# Patient Record
Sex: Female | Born: 1980
Health system: Southern US, Community
[De-identification: ages and names within clinical notes are randomized; demographics above are authoritative.]

## PROBLEM LIST (undated history)

## (undated) DIAGNOSIS — T7840XA Allergy, unspecified, initial encounter: Secondary | ICD-10-CM

## (undated) DIAGNOSIS — Z860100 Personal history of colon polyps, unspecified: Secondary | ICD-10-CM

## (undated) DIAGNOSIS — A6920 Lyme disease, unspecified: Secondary | ICD-10-CM

## (undated) DIAGNOSIS — G479 Sleep disorder, unspecified: Secondary | ICD-10-CM

## (undated) DIAGNOSIS — K5190 Ulcerative colitis, unspecified, without complications: Secondary | ICD-10-CM

## (undated) DIAGNOSIS — D649 Anemia, unspecified: Secondary | ICD-10-CM

## (undated) DIAGNOSIS — F419 Anxiety disorder, unspecified: Secondary | ICD-10-CM

## (undated) DIAGNOSIS — R630 Anorexia: Secondary | ICD-10-CM

## (undated) DIAGNOSIS — M8700 Idiopathic aseptic necrosis of unspecified bone: Secondary | ICD-10-CM

## (undated) DIAGNOSIS — K219 Gastro-esophageal reflux disease without esophagitis: Secondary | ICD-10-CM

## (undated) DIAGNOSIS — F32A Depression, unspecified: Secondary | ICD-10-CM

## (undated) HISTORY — DX: Personal history of colon polyps, unspecified: Z86.0100

## (undated) HISTORY — DX: Allergy, unspecified, initial encounter: T78.40XA

## (undated) HISTORY — DX: Depression, unspecified: F32.A

## (undated) HISTORY — DX: Anxiety disorder, unspecified: F41.9

## (undated) HISTORY — PX: OTHER SURGICAL HISTORY: SHX169

## (undated) HISTORY — DX: Idiopathic aseptic necrosis of unspecified bone: M87.00

## (undated) HISTORY — PX: LASIK: SHX215

## (undated) HISTORY — DX: Gastro-esophageal reflux disease without esophagitis: K21.9

## (undated) HISTORY — DX: Anemia, unspecified: D64.9

## (undated) HISTORY — PX: COLONOSCOPY: SHX174

## (undated) HISTORY — DX: Personal history of colonic polyps: Z86.010

---
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
## 2000-02-16 ENCOUNTER — Other Ambulatory Visit: Admission: RE | Admit: 2000-02-16 | Discharge: 2000-02-16 | Payer: Self-pay | Admitting: *Deleted

## 2001-03-29 ENCOUNTER — Other Ambulatory Visit: Admission: RE | Admit: 2001-03-29 | Discharge: 2001-03-29 | Payer: Self-pay | Admitting: Obstetrics and Gynecology

## 2002-04-26 ENCOUNTER — Other Ambulatory Visit: Admission: RE | Admit: 2002-04-26 | Discharge: 2002-04-26 | Payer: Self-pay | Admitting: Obstetrics and Gynecology

## 2003-04-16 ENCOUNTER — Other Ambulatory Visit: Admission: RE | Admit: 2003-04-16 | Discharge: 2003-04-16 | Payer: Self-pay | Admitting: Obstetrics and Gynecology

## 2006-01-11 DIAGNOSIS — A6920 Lyme disease, unspecified: Secondary | ICD-10-CM

## 2006-01-11 HISTORY — DX: Lyme disease, unspecified: A69.20

## 2006-07-12 DIAGNOSIS — A6920 Lyme disease, unspecified: Secondary | ICD-10-CM | POA: Insufficient documentation

## 2007-08-10 ENCOUNTER — Emergency Department (HOSPITAL_COMMUNITY): Admission: EM | Admit: 2007-08-10 | Discharge: 2007-08-11 | Payer: Self-pay | Admitting: Emergency Medicine

## 2008-03-17 ENCOUNTER — Emergency Department (HOSPITAL_COMMUNITY): Admission: EM | Admit: 2008-03-17 | Discharge: 2008-03-17 | Payer: Self-pay | Admitting: Emergency Medicine

## 2008-07-16 ENCOUNTER — Encounter: Payer: Self-pay | Admitting: Internal Medicine

## 2008-08-21 ENCOUNTER — Encounter: Payer: Self-pay | Admitting: Internal Medicine

## 2008-09-13 ENCOUNTER — Ambulatory Visit (HOSPITAL_COMMUNITY): Admission: RE | Admit: 2008-09-13 | Discharge: 2008-09-13 | Payer: Self-pay | Admitting: Gastroenterology

## 2008-09-13 ENCOUNTER — Encounter: Payer: Self-pay | Admitting: Internal Medicine

## 2008-09-13 ENCOUNTER — Encounter (INDEPENDENT_AMBULATORY_CARE_PROVIDER_SITE_OTHER): Payer: Self-pay | Admitting: Gastroenterology

## 2009-01-06 ENCOUNTER — Emergency Department (HOSPITAL_COMMUNITY): Admission: EM | Admit: 2009-01-06 | Discharge: 2009-01-06 | Payer: Self-pay | Admitting: Emergency Medicine

## 2009-01-14 ENCOUNTER — Encounter: Payer: Self-pay | Admitting: Internal Medicine

## 2009-01-28 ENCOUNTER — Encounter: Payer: Self-pay | Admitting: Internal Medicine

## 2009-01-30 ENCOUNTER — Encounter: Payer: Self-pay | Admitting: Internal Medicine

## 2009-01-30 ENCOUNTER — Ambulatory Visit (HOSPITAL_COMMUNITY): Admission: RE | Admit: 2009-01-30 | Discharge: 2009-01-30 | Payer: Self-pay | Admitting: Gastroenterology

## 2009-01-31 ENCOUNTER — Encounter: Payer: Self-pay | Admitting: Internal Medicine

## 2009-02-12 ENCOUNTER — Encounter: Payer: Self-pay | Admitting: Internal Medicine

## 2009-02-12 LAB — CONVERTED CEMR LAB: TSH: 1.24 microintl units/mL

## 2009-02-14 ENCOUNTER — Encounter: Payer: Self-pay | Admitting: Internal Medicine

## 2009-03-05 ENCOUNTER — Encounter: Payer: Self-pay | Admitting: Internal Medicine

## 2009-03-24 ENCOUNTER — Ambulatory Visit: Payer: Self-pay | Admitting: Occupational Medicine

## 2009-03-24 ENCOUNTER — Ambulatory Visit: Payer: Self-pay | Admitting: Diagnostic Radiology

## 2009-03-24 ENCOUNTER — Encounter: Payer: Self-pay | Admitting: Emergency Medicine

## 2009-03-25 ENCOUNTER — Encounter: Payer: Self-pay | Admitting: Internal Medicine

## 2009-03-25 ENCOUNTER — Inpatient Hospital Stay (HOSPITAL_COMMUNITY): Admission: EM | Admit: 2009-03-25 | Discharge: 2009-03-27 | Payer: Self-pay | Admitting: Internal Medicine

## 2009-03-27 ENCOUNTER — Encounter: Payer: Self-pay | Admitting: Internal Medicine

## 2009-04-25 ENCOUNTER — Encounter (INDEPENDENT_AMBULATORY_CARE_PROVIDER_SITE_OTHER): Payer: Self-pay | Admitting: *Deleted

## 2009-06-02 ENCOUNTER — Ambulatory Visit: Payer: Self-pay | Admitting: Internal Medicine

## 2009-06-02 DIAGNOSIS — D500 Iron deficiency anemia secondary to blood loss (chronic): Secondary | ICD-10-CM | POA: Insufficient documentation

## 2009-06-02 DIAGNOSIS — K5190 Ulcerative colitis, unspecified, without complications: Secondary | ICD-10-CM | POA: Insufficient documentation

## 2009-06-03 DIAGNOSIS — Z860100 Personal history of colon polyps, unspecified: Secondary | ICD-10-CM | POA: Insufficient documentation

## 2009-06-06 ENCOUNTER — Encounter: Payer: Self-pay | Admitting: Internal Medicine

## 2009-06-06 LAB — CONVERTED CEMR LAB
Basophils Absolute: 0 10*3/uL (ref 0.0–0.1)
Basophils Relative: 0.5 % (ref 0.0–3.0)
CRP, High Sensitivity: 22.18 — ABNORMAL HIGH (ref 0.00–5.00)
Eosinophils Absolute: 0.1 10*3/uL (ref 0.0–0.7)
Eosinophils Relative: 1.5 % (ref 0.0–5.0)
HCT: 34.8 % — ABNORMAL LOW (ref 36.0–46.0)
Hemoglobin: 11.3 g/dL — ABNORMAL LOW (ref 12.0–15.0)
Monocytes Absolute: 0.6 10*3/uL (ref 0.1–1.0)
Neutro Abs: 5.4 10*3/uL (ref 1.4–7.7)
RBC: 4.32 M/uL (ref 3.87–5.11)
RDW: 16.6 % — ABNORMAL HIGH (ref 11.5–14.6)

## 2009-07-07 ENCOUNTER — Ambulatory Visit: Payer: Self-pay | Admitting: Internal Medicine

## 2009-07-11 LAB — CONVERTED CEMR LAB
Basophils Relative: 0.4 % (ref 0.0–3.0)
Eosinophils Absolute: 0.1 10*3/uL (ref 0.0–0.7)
Eosinophils Relative: 0.7 % (ref 0.0–5.0)
Ferritin: 6.6 ng/mL — ABNORMAL LOW (ref 10.0–291.0)
Hemoglobin: 11.6 g/dL — ABNORMAL LOW (ref 12.0–15.0)
Lymphocytes Relative: 21.9 % (ref 12.0–46.0)
Monocytes Absolute: 0.7 10*3/uL (ref 0.1–1.0)
Monocytes Relative: 5.6 % (ref 3.0–12.0)
Neutrophils Relative %: 71.4 % (ref 43.0–77.0)
Platelets: 332 10*3/uL (ref 150.0–400.0)
RDW: 15.3 % — ABNORMAL HIGH (ref 11.5–14.6)
Vitamin B-12: 437 pg/mL (ref 211–911)

## 2009-07-31 ENCOUNTER — Telehealth: Payer: Self-pay | Admitting: Internal Medicine

## 2009-09-02 ENCOUNTER — Ambulatory Visit: Payer: Self-pay | Admitting: Internal Medicine

## 2009-09-02 LAB — CONVERTED CEMR LAB
Basophils Absolute: 0 10*3/uL (ref 0.0–0.1)
CRP, High Sensitivity: 32.41 — ABNORMAL HIGH (ref 0.00–5.00)
Hemoglobin: 10.2 g/dL — ABNORMAL LOW (ref 12.0–15.0)
MCHC: 33.5 g/dL (ref 30.0–36.0)
Monocytes Relative: 8.5 % (ref 3.0–12.0)
RBC: 3.65 M/uL — ABNORMAL LOW (ref 3.87–5.11)
RDW: 17.2 % — ABNORMAL HIGH (ref 11.5–14.6)
Sed Rate: 69 mm/hr — ABNORMAL HIGH (ref 0–22)
WBC: 10.2 10*3/uL (ref 4.5–10.5)

## 2009-09-05 ENCOUNTER — Ambulatory Visit: Payer: Self-pay | Admitting: Internal Medicine

## 2009-09-05 DIAGNOSIS — Z9119 Patient's noncompliance with other medical treatment and regimen: Secondary | ICD-10-CM | POA: Insufficient documentation

## 2009-09-10 ENCOUNTER — Telehealth: Payer: Self-pay | Admitting: Internal Medicine

## 2009-09-30 ENCOUNTER — Ambulatory Visit (HOSPITAL_COMMUNITY): Admission: RE | Admit: 2009-09-30 | Discharge: 2009-09-30 | Payer: Self-pay | Admitting: Internal Medicine

## 2009-09-30 ENCOUNTER — Encounter: Payer: Self-pay | Admitting: Internal Medicine

## 2009-10-03 ENCOUNTER — Telehealth: Payer: Self-pay | Admitting: Internal Medicine

## 2009-10-30 ENCOUNTER — Ambulatory Visit: Payer: Self-pay | Admitting: Internal Medicine

## 2009-10-30 LAB — CONVERTED CEMR LAB
Basophils Absolute: 0 10*3/uL (ref 0.0–0.1)
Basophils Relative: 0.4 % (ref 0.0–3.0)
Eosinophils Absolute: 0.1 10*3/uL (ref 0.0–0.7)
Eosinophils Relative: 1 % (ref 0.0–5.0)
HCT: 34.7 % — ABNORMAL LOW (ref 36.0–46.0)
Hemoglobin: 11.6 g/dL — ABNORMAL LOW (ref 12.0–15.0)
Lymphocytes Relative: 32.7 % (ref 12.0–46.0)
Lymphs Abs: 3.2 10*3/uL (ref 0.7–4.0)
Monocytes Relative: 8.9 % (ref 3.0–12.0)
Neutro Abs: 5.5 10*3/uL (ref 1.4–7.7)
Platelets: 224 10*3/uL (ref 150.0–400.0)
RBC: 3.95 M/uL (ref 3.87–5.11)
RDW: 19 % — ABNORMAL HIGH (ref 11.5–14.6)

## 2009-11-01 ENCOUNTER — Telehealth: Payer: Self-pay | Admitting: Internal Medicine

## 2009-12-03 ENCOUNTER — Encounter (INDEPENDENT_AMBULATORY_CARE_PROVIDER_SITE_OTHER): Payer: Self-pay | Admitting: *Deleted

## 2009-12-11 ENCOUNTER — Ambulatory Visit: Payer: Self-pay | Admitting: Internal Medicine

## 2009-12-11 LAB — CONVERTED CEMR LAB
Eosinophils Relative: 2.2 % (ref 0.0–5.0)
Ferritin: 147 ng/mL (ref 10.0–291.0)
HCT: 36.6 % (ref 36.0–46.0)
Lymphs Abs: 3.1 10*3/uL (ref 0.7–4.0)
MCV: 89.7 fL (ref 78.0–100.0)
Monocytes Absolute: 0.6 10*3/uL (ref 0.1–1.0)
Neutrophils Relative %: 41.7 % — ABNORMAL LOW (ref 43.0–77.0)
Platelets: 189 10*3/uL (ref 150.0–400.0)
RDW: 17.6 % — ABNORMAL HIGH (ref 11.5–14.6)
WBC: 6.7 10*3/uL (ref 4.5–10.5)

## 2009-12-17 ENCOUNTER — Encounter (INDEPENDENT_AMBULATORY_CARE_PROVIDER_SITE_OTHER): Payer: Self-pay | Admitting: *Deleted

## 2009-12-17 ENCOUNTER — Ambulatory Visit: Payer: Self-pay | Admitting: Internal Medicine

## 2010-01-06 ENCOUNTER — Telehealth: Payer: Self-pay | Admitting: Internal Medicine

## 2010-01-09 ENCOUNTER — Telehealth (INDEPENDENT_AMBULATORY_CARE_PROVIDER_SITE_OTHER): Payer: Self-pay | Admitting: *Deleted

## 2010-01-27 ENCOUNTER — Telehealth (INDEPENDENT_AMBULATORY_CARE_PROVIDER_SITE_OTHER): Payer: Self-pay | Admitting: *Deleted

## 2010-01-29 ENCOUNTER — Other Ambulatory Visit: Payer: Self-pay | Admitting: Obstetrics and Gynecology

## 2010-02-10 NOTE — Progress Notes (Signed)
Summary: Triage  Phone Note Call from Patient Call back at Home Phone [phone]   Caller: Patient Call For: Dr. Carlean Purl Reason for Call: Talk to Nurse Summary of Call: Calling about her Iron infusion that is suppose to be sch'd Initial call taken by: Webb Laws,  September 10, 2009 9:03 AM  Follow-up for Phone Call        Last seen 09-05-09. When does she need an Iron Infusion?  [place] [facility]  Follow-up by: Vivia Ewing LPN,  September 10, 7193 9:16 AM  Additional Follow-up for Phone Call Additional follow up Details #1::        any time now I think we were waiting for her to tell us when sho could? Gatha Mayer MD, [facility]  September 10, 2009 12:59 PM  LM to [facility] at home number. Abelino Derrick CMA Deborra Medina)  September 10, 2009 3:40 PM   RC from pt.  I apologized to patient for confusion in who was supposed to call who.  Advised her I have spoken to Short Stay at [facility] and the best they can do is next Friday, but they were unable to schedule at the time.  I told her I will call later today to schedule and will call her with date and time.  She should most definitely hear from [place] by tomorrow afternoon.  Per pt it is OK to leave instructions on cell (home) phone voicemail. Additional Follow-up by: Abelino Derrick CMA Deborra Medina),  September 11, 2009 9:14 AM    Additional Follow-up for Phone Call Additional follow up Details #2::    Pt is scheduled for 09/24/09 pt aware, but would rather do on 09/25/09 short stay unable to do that day [place] Deborra Medina)  September 11, 2009 4:07 PM    Appended Document: Triage iron infusion rescheduled with Tammy at [facility]  short stay to Tuesday, Sept 20.  LM to [facility] at home number to discuss new date/time.  Appended Document: Triage RC from pt stating date/time are perfect and she will be there.  Appended Document: Orders Update    Clinical Lists Changes  Orders: Added new Test order of Iron Infusion (Iron Inf) -  Signed

## 2010-02-10 NOTE — Assessment & Plan Note (Addendum)
Summary: anemia f/u   History of Present Illness Visit Type: Follow-up Visit Primary GI MD: [name] MD [facility] Primary Provider: [name], MD  Requesting Provider: na Chief Complaint: anemia  History of Present Illness:   30 yo ww with ulcerative colitis. Here for follow-up after iron infusion. Stil fatigued, maybe somewhat better since iron infusion. Denies abdominal pain. Someties she is gassy with certain foods. No diarrhea or rectal bleeding. She is now compliant with Lialda "My boyfriend helps me with a reminder on his phone".   GI Review of Systems      Denies abdominal pain, acid reflux, belching, bloating, chest pain, dysphagia with liquids, dysphagia with solids, heartburn, loss of appetite, nausea, vomiting, vomiting blood, weight loss, and  weight gain.        Denies anal fissure, black tarry stools, change in bowel habit, constipation, diarrhea, diverticulosis, fecal incontinence, heme positive stool, hemorrhoids, irritable bowel syndrome, jaundice, light color stool, liver problems, rectal bleeding, and  rectal pain.    Current Medications (verified): 1)  Depo-Provera 400 Mg/ml Susp (Medroxyprogesterone Acetate) .... One Injection Once A Month 2)  Lialda 1.2 Gm Tbec (Mesalamine) .... Two Tablets By Mouth Two Times A Day 3)  Hyoscyamine Sulfate 0.125 Mg Subl (Hyoscyamine Sulfate) .Marland Kitchen.. 1-2 Every Hours As Needed For Abdominal Cramps  Allergies (verified): No Known Drug Allergies  Past History:  Past Medical History: Ulcerative colitis - universal, symptoms began 2010 Lyme disease adenoma of rectum Anemia  Past Surgical History: Reviewed history from 03/24/2009 and no changes required. "tubes" in ears  Family History: Reviewed history from 06/02/2009 and no changes required. Breast CA- Mother Family History of Colon Cancer: PGM Family History of Colon Polyps: Paternal Aunt  Family History of Diabetes: MGM, PGF  Family History of Heart Disease: MGM and  MGF  Family History of Irritable Bowel Syndrome: Mom  Social History: Reviewed history from 06/02/2009 and no changes required. 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 Eyes:   no icterus. Lungs:  Clear throughout to auscultation. Heart:  Regular rate and rhythm; no murmurs, rubs,  or bruits. Abdomen:  soft and nontender without HSM/mass BS+ Rectal:  deferred until time of colonoscopy.   Skin:  tattoo lower back   Impression & Recommendations:  Problem # 1:  COLITIS, ULCERATIVE, UNIVERSAL (ICD-556.6)  Signs and symptoms began Spring 2010 with tensemus, loose stools and bleeding. Flex sig 08/21/08 with proctitis and rectal polyp. Colonoscopy 09/13/2008 with 8 mm rectal adenoma, proctitis and peri-appendiceal inflammation. Colonoscopy 01/30/09 with universal colitis, biospies show moderate to severe colitis, no dysplasia and normal terminal ileum. TTG antibody negative, C. diff negative in Jan 2011.Treated with Lialda and steroids intermittently, hospitalized March 2011.  Much better now and says she is compliant with Lialda now - so continue with that  Orders: Colonoscopy (Colon)  Problem # 2:  COLONIC POLYPS, ADENOMATOUS, HX OF (  ICD-V12.72) rectal adenoma in setting of ulcerative colitis in 2010. Needs closer follow-up due to this.  Orders: Colonoscopy (Colon)  Problem # 3:  SCREENING, COLON CANCER (ICD-V76.51)  Orders: Colonoscopy (Colon)  Problem # 4:  ANEMIA, SECONDARY TO BLOOD LOSS  (ICD-280.0) Assessment: Improved Ferritin >100 and Hgb normal after IV iron follow-up labs  later in year - arrange after colonoscpy  Patient Instructions: 1)  Please pick up your medications at your pharmacy.  2)  We will see you at your procedure on 02/02/10. 3)  [place] Patient Information Guide given to patient.  4)  Colonoscopy and Flexible Sigmoidoscopy brochure given.  5)  Copy sent to : [name], MD 6)  The medication list was reviewed and reconciled.  All changed / newly prescribed medications were explained.  A complete medication list was provided to the patient / caregiver. Prescriptions: REGLAN 10 MG  TABS (METOCLOPRAMIDE HCL) As per prep instructions.  #2 x 0   Entered by:   Abelino Derrick CMA ([place])   Authorized by:   Gatha Mayer MD, [facility]   Signed by:   Abelino Derrick CMA ([place]) on 12/17/2009   Method used:   Electronically to        [facility]. [phone]* (retail)       [address] [place], [place]  [address]       Ph: [phone]       Fax: [phone]   RxID:   [id number] DULCOLAX 5 MG  TBEC (BISACODYL) Day before procedure take 2 at 3pm and 2 at 8pm.  #4 x 0   Entered by:   Abelino Derrick CMA ([place])   Authorized by:   Gatha Mayer MD, [facility]   Signed by:   Abelino Derrick CMA ([place]) on 12/17/2009   Method used:   Electronically to        [facility]. [phone]* (retail)       [place], [place]  [address]       Ph: [phone]       Fax: [phone]   RxID:   [id number] [place]   POWD (POLYETHYLENE GLYCOL 3350) As per prep  instructions.  #255gm x 0   Entered by:   Abelino Derrick CMA ([place])   Authorized by:   Gatha Mayer MD, [facility]   Signed by:   Abelino Derrick CMA ([place]) on 12/17/2009   Method used:   Electronically to        [facility]. [phone]* (retail)       [address] [place], [place]  [address]       Ph:  [phone]       Fax: [phone]   RxID:   [id number]   Appended Document: anemia f/u she has had colonoscopy needs CBC and ferritin to follow-up anemia in [facility] April 2012  Appended Document: anemia f/u labs entered in [facility]

## 2010-02-10 NOTE — Assessment & Plan Note (Signed)
Summary: colitis--ch.   History of Present Illness Visit Type: new patient  Primary GI MD: [name] MD [facility] Primary Provider: [name], MD  Requesting Provider: na Chief Complaint: ulcerative colitis History of Present Illness:   30 yo single white woman with uniiversal ulcerative colitis, diagnosed initially as ulcerative proctitis in 2010. She is here to establish care with me. She has no active complaints today but does have some mild abdominal pain at times. Stools are now formed after a winter marked by a flare of ulcerative colitis and hospitalization in March 2011. Symptoms and signs were bloody diarrhea, abdominal pain and nausea and vomiting. She was treated with prednisone and Lialda and is now on lialda alone, taking 2.4 grams bid. Her mom accompanies her and provides some history.   GI Review of Systems    Reports abdominal pain.     Location of  Abdominal pain: lower abdomen.    Denies acid reflux, belching, bloating, chest pain, dysphagia with liquids, dysphagia with solids, heartburn, loss of appetite, nausea, vomiting, vomiting blood, and  weight loss.      Reports rectal bleeding and  rectal pain.     Denies anal fissure, black tarry stools, change in bowel habit, constipation, diarrhea, diverticulosis, fecal incontinence, heme positive stool, hemorrhoids, irritable bowel syndrome, jaundice, light color stool, and  liver problems. Flexible Sigmoidoscopy  Procedure date:  08/21/2008  Findings:      Proctitis to 10 cm rectal polyp (95m)  JSamara Deist MD  Colonoscopy  Procedure date:  09/13/2008  Findings:      Proctitis Rectal adenoma (849mremoved) Inflammation periappendiceal  J. HaAmedeo PlentyMD  Colonoscopy  Procedure date:  01/30/2009  Findings:      Colitis - universal, consistent with ulcerative colitis normal terminal ileum  Cecal biopsies moderatel active chronic colitis  Rectal biopsies moderately to severely active chronic colitis  No  dysplasia or malignancy  J. HaAmedeo PlentyMD  CT Abdomen/Pelvis  Procedure date:  03/24/2009  Findings:         1.  Circumferential bowel wall thickening and edema most severe in   the rectum and sigmoid colon consistent with acute inflammatory   bowel disease.  Bowel wall edema extends into the descending colon.   2.  Fluid in the ascending colon suggest diarrheal disease.   3.  No evidence of abscess or other source of infection.     Current Medications (verified): 1)  Depo Shot .... As Directed 2)  Multivitamins  Caps (Multiple Vitamin) .... Once Daily 3)  Lialda 1.2 Gm Tbec (Mesalamine) .... Take Four Tablets By Mouth Once Daily 4)  Promethazine Hcl 25 Mg Tabs (Promethazine Hcl) .... One Tablet Every 4-6 Hours As Needed For Nausea  Allergies (verified): No Known Drug Allergies  Past History:  Past Medical History: Ulcerative colitis - universal, symptoms began 2010 Lymes disease Colon Polyps   Past Surgical History: Reviewed history from 03/24/2009 and no changes required. "tubes" in ears  Family History: Breast CA- Mother Family History of Colon Cancer: PGM Family History of Colon Polyps: Paternal Aunt  Family History of Diabetes: MGM, PGF  Family History of Heart Disease: MGM and MGF  Family History of Irritable Bowel Syndrome: Mom  Social History: Occupation: RNTherapist, sportsWLDeepwaterth floor  Single Never Smoked Alcohol use-yes- 5/week Drug use-no Regular exercise-no Daily Caffeine Use: 2-3   Review of Systems       The patient complains of allergy/sinus, fatigue, fever, headaches-new, muscle pains/cramps, sleeping problems, sore throat, swelling of  feet/legs, and swollen lymph glands.         Most of these occurred with UC flare in March 2011 All other ROS negative except as per HPI.   Vital Signs:  Patient profile:   30 year old female Height:      66 inches Weight:      109 pounds BMI:     17.66 BSA:     1.55 Pulse rate:   104 / minute Pulse rhythm:    regular BP sitting:   116 / 64  (left arm) Cuff size:   regular  Vitals Entered By: Hope Pigeon CMA (Jun 02, 2009 3:10 PM)  Physical Exam  General:  Well developed, well nourished, no acute distress. Eyes:  PERRLA, no icterus. Mouth:  No deformity or lesions, dentition normal. Neck:  Supple; no masses or thyromegaly. Lungs:  Clear throughout to auscultation. Heart:  Regular rate and rhythm; no murmurs, rubs,  or bruits. Abdomen:  mildly sensitive in LLQ otherwise soft and nontender without HSM/mass BS+ Extremities:  No clubbing, cyanosis, edema or deformities noted. Neurologic:  Alert and  oriented x4;  grossly normal neurologically. Cervical Nodes:  No significant cervical or supraclavicular adenopathy.  Psych:  Alert and cooperative. Normal mood and affect.   Impression & Recommendations:  Problem # 1:  COLITIS, ULCERATIVE, UNIVERSAL (ICD-556.6) Assessment New Signs and symptoms began Spring 2010 with tensemus, loose stools and bleeding. Flex sig 08/21/08 with proctitis and rectal polyp. Colonoscopy 09/13/2008 with 8 mm rectal adenoma, proctitis and per-appendiceal inflammation. Colonoscopy 01/30/09 with universal colitis, biospies show moderate to severe colitis, no dysplasia and normal terminal ileum. TTG antibody negative, C. diff negative in Jan 2011.Treated with Lialda and steroids intermittently, hospitalized March 2011. She has tolerated a taper of prednisone and is seemingly in remission on Lialda 4.8 g/day. Immunomodulators therapy has been mentioned but does not seem necessary at this point. i told her it might be, depending upon clinical course. Timing of next endoscopic exam not clear but rectal adenoma is an indication to consider reassessment of  rectal area at least later this year (not discussed with patient today). I will expplain that to her in follow-up phone call. Orders: TLB-CRP-High Sensitivity (C-Reactive Protein) (86140-FCRP) TLB-Sedimentation Rate (ESR)  (85652-ESR)  Problem # 2:  ANEMIA, SECONDARY TO BLOOD LOSS (ICD-280.0) Assessment: New She has been anemic, Hgb in 8 range at hospital.. Ferrous sulfate has caused constipation. Orders: TLB-CBC Platelet - w/Differential (85025-CBCD)  Problem # 3:  COLONIC POLYPS, ADENOMATOUS, HX OF (ICD-V12.72) Assessment: Unchanged 8 mm sessile rectal adenoma snared 09/13/2008. in setting of active proctitis. Likely a sporadic adenoma but does raise some ? of dysplasia associated changes in UC. think at least flex follow-up later this year reasonable as symptoms and signs of UC under control. Did not discuss with patient today but will do so.  Patient Instructions: 1)  Please go to the basement to have your lab tests drawn today.  2)  We will arrange follow up after lab review. 3)  [facility] membership info given. 4)  Copy sent to : [name], MD 5)  The medication list was reviewed and reconciled.  All changed / newly prescribed medications were explained.  A complete medication list was provided to the patient / caregiver. Prescriptions: LIALDA 1.2 GM TBEC (MESALAMINE) two tablets by mouth two times a day  #120 x 5   Entered and Authorized by:   Gatha Mayer MD, [facility]   Signed by:   Gatha Mayer  MD, [facility] on 06/02/2009   Method used:   Electronically to        [facility]. [phone]* (retail)       [address] [place], [place]  [address]       Ph: [phone]       Fax: [phone]   RxID:   [id number]  Patient: Brianna Shaw: All result statuses are Final unless otherwise noted.  Tests: (1) CBC Platelet w/Diff (CBCD)   White Cell Count          8.4 K/uL                    4.5-10.5   Red Cell Count            4.32 Mil/uL                 3.87-5.11   Hemoglobin           [L]  11.3 g/dL                   12.0-15.0   Hematocrit           [L]  34.8 %                      36.0-46.0   MCV                       80.5 fl                     78.0-100.0   MCHC                       32.4 g/dL                   30.0-36.0   RDW                  [H]  16.6 %                      11.5-14.6   Platelet Count            378.0 K/uL                  150.0-400.0   Neutrophil %              64.7 %                      43.0-77.0   Lymphocyte %              26.2 %                      12.0-46.0   Monocyte %                7.1 %                       3.0-12.0   Eosinophils%              1.5 %                       0.0-5.0   Basophils %               0.5 %  0.0-3.0   Neutrophill Absolute      5.4 K/uL                    1.4-7.7   Lymphocyte Absolute       2.2 K/uL                    0.7-4.0   Monocyte Absolute         0.6 K/uL                    0.1-1.0  Eosinophils, Absolute                             0.1 K/uL                    0.0-0.7   Basophils Absolute        0.0 K/uL                    0.0-0.1  Tests: (2) Full Range CRP (FCRP)   CRPH                 [H]  22.18 mg/L                  0.00-5.00     Shaw:  An elevated hs-CRP (>5 mg/L) should be repeated after 2 weeks to rule out recent infection or trauma.  Tests: (3) Sed Rate (ESR)   Sed Rate             [H]  75 mm/hr                    0-22  Shaw: An exclamation mark (!) indicates a result that was not dispersed into the flowsheet. Document Creation Date: 06/02/2009 4:55 PM _______________________________________________________________________

## 2010-02-10 NOTE — Consult Note (Signed)
Summary: Ulcerative Colitis    NAME:  Brianna Shaw, Brianna Shaw NO.:  [id number]      MEDICAL RECORD NO.:  [id number]          PATIENT TYPE:  INP      LOCATION:  5019                         FACILITY:  [facility]      PHYSICIAN:  Wonda Horner, M.D.   DATE OF BIRTH:  Sep 07, 1980      DATE OF CONSULTATION:  03/25/2009   DATE OF DISCHARGE:                                    CONSULTATION      REASON FOR CONSULTATION:  The patient is a 30 year old Caucasian female   with a history of a pan-ulcerative colitis which was diagnosed in   January of this year.  The patient is being admitted to the hospital   because of a flare-up associated with fever, abdominal pain in the lower   abdomen, abdominal cramping, diarrhea, and rectal bleeding.      The patient was on Lialda 2 pills b.i.d. as treatment for her ulcerative   colitis and has recently been tapered off prednisone after being on it   for several weeks.  The patient states that she has actually been on a   tapering dose of prednisone twice in the past for her colitis.  She has   had flare-ups both times after coming off the prednisone.  A CT scan of   the abdomen and pelvis has been done which shows circumferential bowel   wall thickening and edema, most severe in the rectum sigmoid area and   looking back at her colonoscopy report from January of this year by Dr.   Amedeo Plenty pancolitis was seen and biopsies were consistent with ulcerative   colitis.      PAST HISTORY/MEDICAL PROBLEMS:  None other than ulcerative colitis.      PAST SURGERIES:  None.      MEDICATIONS:  Lialda 4 pills daily split in dosage of 2 pills b.i.d.   because of the patient's work schedule, she states.      ALLERGIES:  No true allergies, but p.o. FLAGYL caused nausea.      SOCIAL HISTORY:  Does not smoke, drinks occasional alcohol.      REVIEW OF SYSTEMS:  Negative except for above.      PHYSICAL EXAMINATION:  GENERAL:  She is alert and oriented.  She  does   not appear in any acute distress.  She is nonicteric.   VITAL SIGNS:  Most recent temperature 104.2.   HEART:  Regular rhythm.  No murmurs.   LUNGS:  Clear.   ABDOMEN:  Bowel sounds normal, soft.  She does have discomfort in the   lower abdomen bilaterally to palpation, but no rebound or guarding.   EXTREMITIES:  Without edema.      IMPRESSION:  Ulcerative colitis with a flare-up.      PLAN:  The patient is admitted to the hospital.  She is receiving IV   hydration.  We will put her back on Lialda 4 pills daily.  She will   receive Solu-Medrol 40 mg IV daily for now.  We  will check stools for   enteric pathogens.      It may be reasonable once the patient gets back being seen by Dr. Amedeo Plenty   as an outpatient to consider a slower dosage taper of prednisone and   also consideration of addition of immunomodulator therapy with either 6-   MP or Imuran  since she has flared up twice now after discontinuing   prednisone.                  ______________________________   Wonda Horner, M.D.            SFG/MEDQ  D:  03/25/2009  T:  03/25/2009  Job:  [id number]      cc:   [name] C. Amedeo Plenty, M.D.      Electronically Signed by Anson Fret MD on 04/24/2009 02:18:53 PM

## 2010-02-10 NOTE — Letter (Signed)
Summary: [facility]  [facility]   Imported By: Bubba Hales 06/13/2009 09:34:16  _____________________________________________________________________  External Attachment:    Type:   Image     Comment:   External Document

## 2010-02-10 NOTE — Letter (Signed)
Summary: Teena Irani MD  Teena Irani MD   Imported By: Bubba Hales 06/13/2009 09:39:20  _____________________________________________________________________  External Attachment:    Type:   Image     Comment:   External Document

## 2010-02-10 NOTE — Letter (Signed)
Summary: [facility] Instructions  [facility]  [place], [place] [address]   Phone: [phone]  Fax: [phone]       Brianna Shaw    05/26/1980    MRN: [id number]      Procedure Day Sudie Grumbling:  Adelfa Koh, 02/02/10     Arrival Time: 3:00 PM     Procedure Time: 4:00 PM    Location of Procedure:                    _X_  [facility] (4th Floor)  [place]  Starting 5 days prior to your procedure 01/28/10 do not eat nuts, seeds, popcorn, corn, beans, peas,  salads, or any raw vegetables.  Do not take any fiber supplements (e.g. Metamucil, Citrucel, and Benefiber). ____________________________________________________________________________________________________  THE DAY BEFORE YOUR PROCEDURE         SUNDAY, 02/01/10  1   Drink clear liquids after lunch-NO SOLID FOOD  2   Do not drink anything colored red or purple.  Avoid juices with pulp.  No orange juice.  3   Drink at least 64 oz. (8 glasses) of fluid/clear liquids during the day to prevent dehydration and help the prep work efficiently.  CLEAR LIQUIDS INCLUDE: Water Jello Ice Popsicles Tea (sugar ok, no milk/cream) Powdered fruit flavored drinks Coffee (sugar ok, no milk/cream) Gatorade Juice: apple, white grape, white cranberry  Lemonade Clear bullion, consomm, broth Carbonated beverages (any kind) Strained chicken noodle soup Hard Candy  4   Mix the entire bottle of Miralax with 64 oz. of Gatorade/Powerade in the morning and put in the refrigerator to chill.  THE DAY OF YOUR PROCEDURE      MONDAY, 02/02/10  1   At 7:00 AM take 2 Dulcolax/Bisacodyl tablets.  2   At 8:30 am take one Reglan/Metoclopramide tablet.  3  Starting at 9:00 am drink one 8 oz glass of the Miralax mixture every 15-20 minutes until you have finished drinking the entire 64 oz.  You should finish drinking prep around 11:30am or 12:00 pm.  4   If you are nauseated, you may take the  2nd Reglan/Metoclopramide tablet at 10:30 am.    5.    At 12:00 pm take 2 more DULCOLAX/Bisacodyl tablets.  You may drink clear liquids until 2:00PM   (2 HOURS BEFORE PROCEDURE). MEDICATION INSTRUCTIONS  Unless otherwise instructed, you should take regular prescription medications with a small sip of water as early as possible the morning of your procedure.       OTHER INSTRUCTIONS  You will need a responsible adult at least 31 years of age to accompany you and drive you home.   This person must remain in the waiting room during your procedure.  Wear loose fitting clothing that is easily removed.  Leave jewelry and other valuables at home.  However, you may wish to bring a book to read or an iPod/MP3 player to listen to music as you wait for your procedure to start.  Remove all body piercing jewelry and leave at home.  Total time from sign-in until discharge is approximately 2-3 hours.  You should go home directly after your procedure and rest.  You can resume normal activities the day after your procedure.  The day of your procedure you should not:   Drive   Make legal decisions   Operate machinery   Drink alcohol   Return to work  You will receive specific instructions about  eating, activities and medications before you leave.   The above instructions have been reviewed and explained to me by   _______________________    I fully understand and can verbalize these instructions _____________________________ Date _______

## 2010-02-10 NOTE — Procedures (Signed)
Summary: Missy Sabins MD  Missy Sabins MD   Imported By: Bubba Hales 06/13/2009 09:50:19  _____________________________________________________________________  External Attachment:    Type:   Image     Comment:   External Document

## 2010-02-10 NOTE — Letter (Signed)
Summary: Appt Reminder [facility]  [place], [place] [address]   Phone: [phone]  Fax: [phone]        Jun 06, 2009 MRN: [id number]    LESLYN MONDA [address] [place], [place]  [address]    Dear Ms. DITULLIO,   You have a return appointment with Dr. Carlean Purl on 07/07/2009 at 9:30 am.  Please remember to bring a complete list of the medicines you are taking, your insurance card and your co-pay.  If you have to cancel or reschedule this appointment, please call before 5:00 pm the evening before to avoid a cancellation fee.  If you have any questions or concerns, please call [phone].    Sincerely,    Barb Merino RN, CGRN  Appended Document: Appt Reminder 2 letter mailed to patient's home

## 2010-02-10 NOTE — Letter (Signed)
Summary: Hulan Amato MD  Hulan Amato MD   Imported By: Bubba Hales 06/13/2009 09:35:26  _____________________________________________________________________  External Attachment:    Type:   Image     Comment:   External Document

## 2010-02-10 NOTE — Assessment & Plan Note (Signed)
Summary: follow up colitis/sehri   History of Present Illness Visit Type: Follow-up Visit Primary GI MD: [name] MD [facility] Primary Provider: [name], MD  Requesting Provider: na Chief Complaint: ulcerative colitis History of Present Illness:   30 yo white woman with some dull right-sided abdominal pain. She has been having some alternating loose and hard stools. No bleeding. No pattern to bowel movements. Hair is falling out and she is concerned. a problem x 2-3 months, began after hospitalization. Is getting all of Lialda in but not at same tmes consistetly due to night shifts. She stopped the iron as directed (it was constipating).  Mom is here with her and provides some history.      GI Review of Systems      Denies abdominal pain, acid reflux, belching, bloating, chest pain, dysphagia with liquids, dysphagia with solids, heartburn, loss of appetite, nausea, vomiting, vomiting blood, weight loss, and  weight gain.        Denies anal fissure, black tarry stools, change in bowel habit, constipation, diarrhea, diverticulosis, fecal incontinence, heme positive stool, hemorrhoids, irritable bowel syndrome, jaundice, light color stool, liver problems, rectal bleeding, and  rectal pain.    Current Medications (verified): 1)  Depo Shot .... As Directed 2)  Multivitamins  Caps (Multiple Vitamin) .... Once Daily 3)  Lialda 1.2 Gm Tbec (Mesalamine) .... Two Tablets By Mouth Two Times A Day  Allergies (verified): No Known Drug Allergies  Past History:  Past Medical History: Ulcerative colitis - universal, symptoms began 2010 Lyme disease Colon Polyps   Past Surgical History: Reviewed history from 03/24/2009 and no changes required. "tubes" in ears  Family History: Reviewed history from 06/02/2009 and no changes required. Breast CA- Mother Family History of Colon Cancer: PGM Family History of Colon Polyps: Paternal Aunt  Family History of Diabetes: MGM, PGF  Family  History of Heart Disease: MGM and MGF  Family History of Irritable Bowel Syndrome: Mom  Social History: Reviewed history from 06/02/2009 and no changes required. 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 Eyes:   no icterus. Abdomen:  soft and nontender without HSM/mass BS+ Skin:  hair is fine and  thin but no spots of alopecia   Impression & Recommendations:  Problem # 1:  COLITIS, ULCERATIVE, UNIVERSAL (ICD-556.6) Signs and symptoms began Spring 2010 with tensemus, loose stools and bleeding. Flex sig 08/21/08 with proctitis and rectal polyp. Colonoscopy 09/13/2008 with 8 mm rectal adenoma, proctitis and peri-appendiceal inflammation. Colonoscopy 01/30/09 with universal colitis, biospies show moderate to severe colitis, no dysplasia and normal terminal ileum. TTG antibody negative, C. diff negative in Jan 2011.Treated with Lialda and steroids intermittently, hospitalized March 2011. She has tolerated a taper of prednisone and is seemingly in remission on Lialda 4.8 g/day. Immunomodulators therapy has been mentioned but does not  seem necessary at this point. i told her it might be, depending upon clinical course. CRP (22) and ESR (75)have been elevated at last visit. Will recheck these to look for trend. she may require immunomodulators but think seeing how she does on high-dose ASA a while longer is reasonable. Will reassess labs today and decide follow-up. Colonoscopy by  01/2010. hyoscyamine as needed abdominal cramps also.  Need to obtain vaccine history  Orders: TLB-CRP-High Sensitivity (C-Reactive Protein) (86140-FCRP) TLB-Sedimentation Rate (ESR) (85652-ESR)  Problem # 2:  ANEMIA, SECONDARY TO BLOOD LOSS (ICD-280.0) Assessment: Improved reasess - she has constipation problems with iron  Orders: TLB-CBC Platelet - w/Differential (85025-CBCD) TLB-B12, Serum-Total ONLY (68341-D62) TLB-Ferritin (82728-FER)  Problem # 3:  ? of TELOGEN EFFLUVIUM (ICD-704.02) Assessment: New I suspect her thinning hair is due to this she may see dermatologist if desired  Problem # 4:  COLONIC POLYPS, ADENOMATOUS, HX OF (ICD-V12.72) Assessment: Unchanged follow-up colonoscopy by 01/2010 if not sooner suspectr sporadic rectal adenoma but raises ?'s of DALM  Patient Instructions: 1)  Please go to the basement to have your lab tests drawn today.  We will call you with further follow up after reviewing these results.  2)  Please pick up your medications at your pharmacy. HYOSCYAMINE 3)  You may wish to see a dermatologist regarding your hair loss.  We frequently refer our patients to Dr. Jari Pigg. 4)  Copy Sent To: [name], MD 5)  The medication list was reviewed and reconciled.  All changed / newly prescribed medications were explained.  A complete medication list was provided to the patient / caregiver.  Prescriptions: HYOSCYAMINE SULFATE 0.125 MG SUBL (HYOSCYAMINE SULFATE) 1-2 every hours as needed for abdominal cramps  #60 x 0   Entered and Authorized by:   Gatha Mayer MD, [facility]   Signed by:   Gatha Mayer MD, [facility] on 07/07/2009   Method used:   Electronically to        [facility]. [phone]* (retail)       [place], [place]  [address]       Ph: [phone]       Fax: [phone]   RxID:   [id number]  Tests: (1) CBC Platelet w/Diff (CBCD)   White Cell Count     [H]  11.7 K/uL                   4.5-10.5   Red  Cell Count            4.43 Mil/uL                 3.87-5.11   Hemoglobin           [L]  11.6 g/dL                   12.0-15.0   Hematocrit           [L]  35.7 %                      36.0-46.0   MCV                       80.6 fl                     78.0-100.0   MCHC  32.5 g/dL                   30.0-36.0   RDW                  [H]  15.3 %                      11.5-14.6   Platelet Count            332.0 K/uL                  150.0-400.0   Neutrophil %              71.4 %                      43.0-77.0   Lymphocyte %              21.9 %                      12.0-46.0   Monocyte %                5.6 %                       3.0-12.0   Eosinophils%              0.7 %                       0.0-5.0   Basophils %               0.4 %                       0.0-3.0   Neutrophill Absolute [H]  8.3 K/uL                    1.4-7.7   Lymphocyte Absolute       2.6 K/uL                    0.7-4.0   Monocyte Absolute         0.7 K/uL                    0.1-1.0  Eosinophils, Absolute                             0.1 K/uL                    0.0-0.7   Basophils Absolute        0.1 K/uL                    0.0-0.1  Tests: (2) B12 Serum - Total ONLY (B12)   Vitamin B12               437 pg/mL                   211-911  Tests: (3) Ferritin (FER)   Ferritin             [L]  6.6 ng/mL                   10.0-291.0  Tests: (4) Full Range CRP (FCRP)   CRPH                 [  H]  14.79 mg/L                  0.00-5.00     Note:  An elevated hs-CRP (>5 mg/L) should be repeated after 2 weeks to rule out recent infection or trauma.  Tests: (5) Sed Rate (ESR)   Sed Rate             [H]  44 mm/hr                    0-22

## 2010-02-10 NOTE — Progress Notes (Signed)
Summary: Triage  Phone Note Call from Patient Call back at Home Phone [phone]   Caller: Patient Call For: Dr. Carlean Purl Reason for Call: Talk to Nurse Summary of Call: had iron infusion on Tuesday and pt wants to know when she needs to sch her labwork Initial call taken by: Lucien Mons,  October 03, 2009 8:47 AM  Follow-up for Phone Call        I have left a message for pt. that I will call her with further instructions after Dr.Gessner advises, he is out of the office until Monday.  DR.GESSNER: When does she need labs and an REV?   Follow-up by: Vivia Ewing LPN,  October 03, 9456 9:59 AM  Additional Follow-up for Phone Call Additional follow up Details #1::        as per last REV cbc 1 month after iron and REV 3 mos after last visit Additional Follow-up by: Gatha Mayer MD, Marval Regal,  October 06, 2009 6:57 AM    Additional Follow-up for Phone Call Additional follow up Details #2::    Message left for pt. with above MD instructions. Lab order is in [place] for 10-30-09. She will need to call and schedule her f/u appt. for around 12-06-09. Pt. instructed to call back as needed.  Follow-up by: Vivia Ewing LPN,  October 07, 5927 11:05 AM

## 2010-02-10 NOTE — Assessment & Plan Note (Signed)
Summary: ULCERATIVE COLITIS   Vital Signs:  Patient Profile:   30 Years Old Female CC:      fever, chills, sinus congestion, body aches, HAs, abdominal pain X 2 days Height:     66 inches Weight:      107 pounds O2 Sat:      99 % O2 treatment:    Room Air Temp:     103.8 degrees F oral Pulse rate:   148 / minute Resp:     20 per minute BP sitting:   104 / 67  (right arm)  Pt. in pain?   yes    Location:   body aches  Vitals Entered By: Betti Cruz, RN                   Updated Prior Medication List: * DEPO SHOT  MULTIVITAMINS  CAPS (MULTIPLE VITAMIN) once daily LIALDA 1.2 GM TBEC (MESALAMINE) bid  Current Allergies: No known allergies History of Present Illness Chief Complaint: fever, chills, sinus congestion, body aches, HAs, abdominal pain X 2 days History of Present Illness: Presents with complaints  of fever, body aches, vomiting, diarrhea for 2 days.   No complaints of cough. No ear pain.  No sore throat.    No rash.  She has ulcerative colitis and has a regular gastroenterologist.    Unable to hold down liquids.   Appears miserable and dehydrated.   REVIEW OF SYSTEMS Constitutional Symptoms       Complains of fever, chills, and fatigue.     Denies night sweats, weight loss, and weight gain.      Comments: body aches Eyes       Denies change in vision, eye pain, eye discharge, glasses, contact lenses, and eye surgery. Ear/Nose/Throat/Mouth       Complains of sinus problems.      Denies hearing loss/aids, change in hearing, ear pain, ear discharge, dizziness, frequent runny nose, frequent nose bleeds, sore throat, hoarseness, and tooth pain or bleeding.  Respiratory       Denies dry cough, productive cough, wheezing, shortness of breath, asthma, bronchitis, and emphysema/COPD.  Cardiovascular       Denies murmurs, chest pain, and tires easily with exhertion.    Gastrointestinal       Complains of stomach pain, nausea/vomiting, and diarrhea.      Denies  constipation, blood in bowel movements, and indigestion. Genitourniary       Denies painful urination, kidney stones, and loss of urinary control. Neurological       Complains of headaches.      Denies paralysis, seizures, and fainting/blackouts. Musculoskeletal       Denies muscle pain, joint pain, joint stiffness, decreased range of motion, redness, swelling, muscle weakness, and gout.  Skin       Denies bruising, unusual mles/lumps or sores, and hair/skin or nail changes.  Psych       Denies mood changes, temper/anger issues, anxiety/stress, speech problems, depression, and sleep problems. Other Comments: Symptoms since saturday AM. Finished series of steroids (for UC flare up) 1 week ago.   Past History:  Past Medical History: Ulceratice colitis Lymes disease  Past Surgical History: "tubes" in ears  Family History: Breast CA- mother  Social History: Occupation: Marine scientist Single Never Smoked Alcohol use-yes- 5/week Drug use-no Regular exercise-no Smoking Status:  never Drug Use:  no Does Patient Exercise:  no Physical Exam General appearance: appears miserable.  Oral/Pharynx: dry cracked lips.  Dry mucous membranes.  Neck:  neck supple,  trachea midline, no masses Chest/Lungs: no rales, wheezes, or rhonchi bilateral, breath sounds equal without effort Heart: regular rate and  rhythm, no murmur Abdomen: mild diffuse tenderness, abdomen soft without obvious organomegaly Assessment New Problems: FEVER (ICD-780.60) GASTROENTERITIS (ICD-558.9)   Plan New Medications/Changes: PROMETHAZINE HCL 25 MG TABS (PROMETHAZINE HCL) one tablet every 4-6 hours as needed for nausea  #20 x 0, 03/24/2009, Elaina Pattee MD  New Orders: New Patient Level III [phone] Admin of Therapeutic Inj  intramuscular or subcutaneous [96372] Ketorolac-Toradol 58m [J1885] Planning Comments:   I think she needs IV fluids I recommended that she seek emergency care.  We did give her 4 mg IM  zofran today in the clinic.   Phenegran 231mby mouth  as needed.  She did not want to go to the ER.  I advised her that the ultimate complication of not following my advise in death.   The patient and/or caregiver has been counseled thoroughly with regard to medications prescribed including dosage, schedule, interactions, rationale for use, and possible side effects and they verbalize understanding.  Diagnoses and expected course of recovery discussed and will return if not improved as expected or if the condition worsens. Patient and/or caregiver verbalized understanding.  Prescriptions: PROMETHAZINE HCL 25 MG TABS (PROMETHAZINE HCL) one tablet every 4-6 hours as needed for nausea  #20 x 0   Entered and Authorized by:   MaElaina PatteeD   Signed by:   MaElaina PatteeD on 03/24/2009   Method used:   Print then Give to Patient   RxID:   [id number] Patient Instructions: 1)  Oral Rehydration Solution: drink 1/2 ounce every 15 minutes. If tolerated afert 1 hour, drink 1 ounce every 15 minutes. As you can tolerate, keep adding 1/2 ounce every 15 minutes, up to a total of 2-4 ounces. Contact the office if unable to tolerate oral solution, if you keep vomiting, or you continue to have signs of dehydration.   Medication Administration  Injection # 1:    Medication: Ketorolac-Toradol 1592m  Diagnosis: GASTROENTERITIS (ICD-558.9)    Route: IM    Site: LUOQ gluteus    Exp Date: 08/12/2010    Lot #: [id number]    Mfr: Hospira    Comments: 60 mgs given    Given by: AmaGeorgiann Mccoyarch 14, 2011 1:13 PM)  Orders Added: 1)  New Patient Level III [99203] 2)  Admin of Therapeutic Inj  intramuscular or subcutaneous [96372] 3)  Ketorolac-Toradol 63m2m1885]

## 2010-02-10 NOTE — Letter (Signed)
Summary: Teena Irani MD  Teena Irani MD   Imported By: Bubba Hales 06/13/2009 09:45:21  _____________________________________________________________________  External Attachment:    Type:   Image     Comment:   External Document

## 2010-02-10 NOTE — Assessment & Plan Note (Signed)
Summary: follow up labs/sheri   History of Present Illness Visit Type: Follow-up Visit Primary GI MD: [name] MD [facility] Primary Provider: [name], MD  Requesting Provider: na Chief Complaint: Here to follow up on her labs History of Present Illness:   30 yo ww here to follow-up on ulcerative colitis and blood loss anemia. Her hgb has decreased by 1 g. She is not taking by mouth iron due to constipation. she is nt taking Lialda regularly, either. "I forget". Thinks she is a bit constipated , tires and stressed over moving but otherwise ok.  Her mom is here with her again today.    GI Review of Systems      Denies abdominal pain, acid reflux, belching, bloating, chest pain, dysphagia with liquids, dysphagia with solids, heartburn, loss of appetite, nausea, vomiting, vomiting blood, weight loss, and  weight gain.        Denies anal fissure, black tarry stools, change in bowel habit, constipation, diarrhea, diverticulosis, fecal incontinence, heme positive stool, hemorrhoids, irritable bowel syndrome, jaundice, light color stool, liver problems, rectal bleeding, and  rectal pain.    Current Medications (verified): 1)  Depo-Provera 400 Mg/ml Susp (Medroxyprogesterone Acetate) .... One Injection Once A Month 2)  Prenatal Vit .... Take One By Mouth Once Daily 3)  Lialda 1.2 Gm Tbec (Mesalamine) .... Two Tablets By Mouth Two Times A Day 4)  Hyoscyamine Sulfate 0.125 Mg Subl (Hyoscyamine Sulfate) .Marland Kitchen.. 1-2 Every Hours As Needed For Abdominal Cramps 5)  Hemocyte 324 Mg Tabs (Ferrous Fumarate) .Marland Kitchen.. 1 By Mouth Once Daily As Needed  Allergies (verified): No Known Drug Allergies  Past History:  Past Medical History: Reviewed history from 07/07/2009 and no changes required. Ulcerative colitis - universal, symptoms began 2010 Lyme disease Colon Polyps   Past Surgical History: Reviewed history from 03/24/2009 and no changes required. "tubes" in ears  Family History: Reviewed  history from 06/02/2009 and no changes required. Breast CA- Mother Family History of Colon Cancer: PGM Family History of Colon Polyps: Paternal Aunt  Family History of Diabetes: MGM, PGF  Family History of Heart Disease: MGM and MGF  Family History of Irritable Bowel Syndrome: Mom  Social History: Reviewed history from 06/02/2009 and no changes required. 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 Lungs:  Clear throughout to auscultation. Heart:  Regular rate and rhythm; no murmurs, rubs,  or bruits. Abdomen:  soft and nontender without HSM/mass BS+   Impression & Recommendations:  Problem # 1:  COLITIS, ULCERATIVE, UNIVERSAL (ICD-556.6) Signs and symptoms began Spring 2010 with tensemus, loose stools and bleeding. Flex sig 08/21/08 with proctitis and rectal polyp. Colonoscopy 09/13/2008 with 8 mm rectal adenoma, proctitis and peri-appendiceal inflammation. Colonoscopy 01/30/09 with universal colitis, biospies show moderate to severe colitis, no dysplasia and normal terminal ileum. TTG antibody negative, C. diff negative in Jan 2011.Treated with Lialda and steroids intermittently, hospitalized March 2011.  She is not  compliant with therapy, her CRP and ESR are elevated and anemia worsened. She denies specific GI complaints for most part. Explained need for compliance with medications. She says she will try. I think she may need immunomodulators but cannot really say as not compliant with Lialda. Reassess in 3 months. Is  going to need a colonoscopy by 01/2010 but would like to see her on consistent Tx first.  Problem # 2:  ANEMIA, SECONDARY TO BLOOD LOSS (ICD-280.0) Assessment: Deteriorated iron infusion as she does not tolerate by mouth iron well it seems cbc month after  Problem # 3:  ? of TELOGEN EFFLUVIUM (ICD-704.02) Assessment: Improved Hair loss resolving she thinks  Problem # 4:  PERS HX NONCOMPLIANCE W/MED TX PRS HAZARDS HLTH (ICD-V15.81) Assessment: Comment Only  Patient Instructions: 1)  Please schedule a follow-up appointment in 3 months. 2)  Iron infusion and cbc 1 month later will be arranged. 3)  Take your medication as prescribed, figure out a way to make that happen to reduce the risk of you getting sicker. 4)  Copy sent to : [name], MD 5)  The medication list was reviewed and reconciled.  All changed / newly prescribed medications were explained.  A complete medication list was provided to the patient / caregiver.

## 2010-02-10 NOTE — Letter (Signed)
Summary: In patient/[place] [facility]  In patient/[facility]   Imported By: Bubba Hales 06/13/2009 09:31:21  _____________________________________________________________________  External Attachment:    Type:   Image     Comment:   External Document

## 2010-02-10 NOTE — Letter (Signed)
Summary: [facility]   Imported By: Bubba Hales 06/13/2009 09:32:58  _____________________________________________________________________  External Attachment:    Type:   Image     Comment:   External Document

## 2010-02-10 NOTE — Op Note (Signed)
Summary: Colonoscopy: Dr. Amedeo Plenty: Adenoma  NAME:  Brianna Shaw, Brianna Shaw                ACCOUNT NO.:  [id number]      MEDICAL RECORD NO.:  [id number]          PATIENT TYPE:  AMB      LOCATION:  ENDO                         FACILITY:  [facility]      PHYSICIAN:  John C. Amedeo Plenty, M.D.    DATE OF BIRTH:  Jul 10, 1980      DATE OF PROCEDURE:  09/13/2008   DATE OF DISCHARGE:  09/13/2008                                  OPERATIVE REPORT      INDICATIONS FOR PROCEDURE:  Rectal polyp and proctitis seen on   sigmoidoscopy.      PROCEDURE:  The patient was placed in the left lateral decubitus   position and placed on the pulse monitor with continuous low-flow oxygen   delivered by nasal cannula.  She was sedated with 125 mcg IV fentanyl   and 12.5 mg IV Versed.  Olympus video colonoscope was inserted into the   rectum and advanced to the cecum, confirmed by transillumination of   McBurney point and visualization of the ileocecal valve and appendiceal   orifice.  The prep was excellent.  The terminal ileum was intubated and   appeared to be normal.  There was visible erythema granularity   surrounding the appendiceal orifice with some exudate at the appendiceal   orifice, raising question of appendicitis, although the patient has had   no clinical symptoms of this.  Biopsies were taken to better assess for   true inflammation and rule out any unexpected neoplasm.  The remainder   of the cecum as well as the ascending, transverse, and descending   sigmoid colon appeared normal with no further inflammation, polyps,   masses or other abnormalities.  At approximately 12 cm, there was an 8   mm sessile polyp consistent with one seen on previous sigmoidoscopy and   this was removed by snare.  There were changes of proctitis with   erythema granularity and friability in the distal 5-7 cm of the rectum.   No biopsies were taken as this had been biopsied before.  The scope was   then withdrawn and the patient returned to  the recovery room in stable   condition.  She tolerated the procedure well and there were no immediate   complications.      IMPRESSION:   1. Appearance of possible periappendiceal inflammation of unclear       significance.   2. Rectal polyp.   3. Proctitis.      PLAN:  Await all biopsy results.                  ______________________________   Elyse Jarvis Amedeo Plenty, M.D.            JCH/MEDQ  D:  09/13/2008  T:  09/14/2008  Job:  [id number]

## 2010-02-10 NOTE — Letter (Signed)
Summary: [facility]   Imported By: Bubba Hales 10/07/2009 13:01:33  _____________________________________________________________________  External Attachment:    Type:   Image     Comment:   External Document

## 2010-02-10 NOTE — Progress Notes (Signed)
Summary: labwork to sch   Phone Note Call from Patient Call back at Home Phone [phone]   Caller: Patient Call For: Dr. Carlean Purl Reason for Call: Talk to Nurse Summary of Call: would like to sch labwork Initial call taken by: Lucien Mons,  July 31, 2009 9:29 AM  Follow-up for Phone Call        Left message for patient to call back. Patient  needs an REV scheduled, she needs labs a few days prior  prior to office visit. Per Dr Celesta Aver append on labs 07/07/09 needs 2 month office visit , needs scheduled for late August or September.  CBC, ESR, and CRP are ordered for 09/08/09. Barb Merino RN, [facility]  July 31, 2009 9:37 AM  Patient  returned my call she will come for REV 09/05/09 2:30  Follow-up by: Barb Merino RN, CGRN,  July 31, 2009 10:27 AM

## 2010-02-10 NOTE — Procedures (Signed)
Summary: Colonoscopy: Dr. Amedeo Plenty: Colitis    NAME:  Brianna Shaw, Brianna Shaw                ACCOUNT NO.:  [id number]      MEDICAL RECORD NO.:  [id number]          PATIENT TYPE:  AMB      LOCATION:  ENDO                         FACILITY:  [facility]      PHYSICIAN:  John C. Amedeo Plenty, M.D.    DATE OF BIRTH:  1980-02-18      DATE OF PROCEDURE:   DATE OF DISCHARGE:  01/30/2009                                  OPERATIVE REPORT      SURGEON:  John C. Amedeo Plenty, MD      INDICATIONS FOR PROCEDURE:  Persistent diarrhea and rectal bleeding.      PROCEDURE:  The patient was placed in the left lateral decubitus   position and placed on pulse monitor with continuous low-flow oxygen   delivered by nasal cannula.  She was sedated with 125 mcg IV fentanyl,   10 mg IV Versed, and was also given 12 mg IV Benadryl.  The Olympus   video colonoscope was inserted into the rectum and advanced to the   terminal ileum.  Terminal ileum was normal.  The entire colon appeared   moderately and diffusely inflamed with erythema and edema, diffusely   adherent exudate, and friability.  Overall, the appearance was most   consistent with ulcerative colitis.  There were no pseudomembranes seen,   no deep ulcers or fissures.  Biopsies were taken of the rectum and   cecum, respectively.  The scope was then withdrawn, and the patient   returned to the recovery room in stable condition.  She tolerated the   procedure well.  There were no immediate complications.      IMPRESSION:  Pancolitis most consistent with ulcerative colitis.      PLAN:  Await biopsy results and we will begin prednisone and 5 asa.                  ______________________________   Elyse Jarvis. Amedeo Plenty, M.D.            JCH/MEDQ  D:  01/30/2009  T:  01/31/2009  Job:  [id number]      Electronically Signed by Teena Irani M.D. on 04/10/2009 12:59:14 PM

## 2010-02-10 NOTE — Discharge Summary (Signed)
Summary: Ulcerative Colitis Flare    NAME:  Brianna, Shaw NO.:  [id number]      MEDICAL RECORD NO.:  [id number]          PATIENT TYPE:  INP      LOCATION:  9371                         FACILITY:  [facility]      PHYSICIAN:  Irine Seal, MD    DATE OF BIRTH:  29-Dec-1980      DATE OF ADMISSION:  03/25/2009   DATE OF DISCHARGE:  03/27/2009                                  DISCHARGE SUMMARY      The patient's gastroenterologist is Dr. Amedeo Plenty of [facility].      DISCHARGE DIAGNOSES:   1. Ulcerative colitis flare.   2. Severe iron-deficiency anemia.   3. Dehydration, resolved.   4. Hypokalemia, repleted.      DISCHARGE MEDICATIONS:   1. Iron Feosol 1 capsule p.o. b.i.d.   2. Prednisone 40 mg p.o. daily.   3. Tylenol 500 mg p.o. daily p.r.n.   4. Ibuprofen 200 mg p.o. daily p.r.n. pain.   5. Lialda 1.2 g 4 tablets p.o. daily.      DISPOSITION AND FOLLOWUP:  The patient will be discharged home.  The   patient is to follow up with Dr. Amedeo Plenty of [facility] on   April 14, 2009.  On followup, a CBC will need to be checked to follow up   on the patient's iron-deficiency anemia.  The patient's ulcerative   colitis flare will need to be reassessed and further management per her   gastroenterologist.  The patient has been started on Feosol 1 capsule   twice daily secondary to her severe iron-deficiency anemia.      CONSULTATIONS DONE:  A gastroenterology consult was done.  The patient   was seen in consultation by Dr. [name] Coop of [facility] on March 25, 2009.      PROCEDURES PERFORMED:  A CT of the abdomen and pelvis was done on March 24, 2009, that showed circumferential bowel wall thickening and edema,   most severe in the rectum and sigmoid colon, consistent with acute   inflammatory bowel disease.  Bowel wall edema extending to the   descending colon.  Fluid in the ascending colon suggested diarrheal   disease.  No evidence of  abscess or other source of infection.      BRIEF ADMISSION HISTORY AND PHYSICAL:  Brianna Shaw is a 30 year old   female with history of ulcerative colitis, started developing fevers 3   days prior to admission and eventually started having multiple episodes   of diarrhea.  On the day of admission, the patient had 2 to 3 episodes   of nausea and emesis and was very dehydrated and decided to present to   the ED.  In the ED the patient had a CT scan of the abdomen and pelvis,   which showed inflammation of the rectosigmoid colon extending up to the   descending colon.  The patient had been admitted for further workup of   flare of ulcerative colitis.  The patient's gastroenterologist, Dr.   Amedeo Plenty,  was already notified and had wanted the patient to be admitted   under the medical service.  In addition to the patient's nausea and   emesis, the patient also did have abdominal pain, mostly suprapubic and   in the left lower quadrant.  The patient denied any chest pain or   shortness of breath, denied any dizziness or loss of consciousness.   Denied any dysuria.  The patient did not have any good urine output on   the day of admission.  Denied any focal deficits or visual symptoms.      For the rest of the admission history and physical, please see history   and physical dictated per Dr. Hal Hope of March 25, 2009.      HOSPITAL COURSE:   1. Acute flare-up of ulcerative colitis.  The patient was admitted       with an acute flare of ulcerative colitis.  She was placed on the       medical floor, hydrated with IV fluids, placed empirically on IV       Flagyl and ciprofloxacin as well as IV Solu-Medrol 40 mg daily.       Stool cultures were obtained, and a Giardia came back negative.  A       C. diff which was checked on the stool also came back negative.       Other stool cultures were pending at the time of this dictation.       Blood cultures were also obtained, which were pending.  The  patient       was pancultured as well secondary to fevers.  Gastroenterology was       consulted.  The patient was seen in consultation by Dr. [name] Coop on       March 25, 2009, and at the time he recommended placing the patient       back on her Lialda 4 pills daily.  She was maintained on Solu-       Medrol as well as stool studies were pending, and she was placed on       IV hydration.  The patient was monitored.  The patient improved       during the hospitalization.  Her diarrhea frequency and amount also       improved during the hospitalization.  The patient did not have any       further rectal bleeding.  The patient's diet was advanced.  The       patient tolerated oral diet.  The patient improved clinically.       Abdominal pain improved and had resolved by day of discharge.  The       patient's ciprofloxacin was subsequently discontinued per GI.  She       was maintained on IV Flagyl and IV Solu-Medrol.  The patient was       seen tolerating p.o., and also she was maintained on Lialda.  It       was recommended by GI to discharge the patient on prednisone 40 mg       daily as well as Lialda and to follow up with Dr. Amedeo Plenty on April 14, 2009, for further management of her ulcerative colitis.  The       patient will be discharged in stable and improved condition.   2. Severe iron-deficiency anemia.  The patient was noted to be anemic       on admission.  It was felt there might have been a component of her       ulcerative colitis in addition to a dilutional effect.  An anemia       panel was obtained.  The patient's iron levels were less than 10.       Her B12 was greater than 2000.  Folate was 16.4, and a ferritin of       150.  The patient remained asymptomatic during the hospitalization.       Her hemoglobin dropped as low as 7.6 but on recheck was 7.9.  The       patient's hemoglobin remained stable.  It was recommended to start       the patient on iron Feosol 1 capsule  twice daily as an outpatient.       The patient will follow up with her gastroenterologist as an       outpatient.  The patient will be discharged in stable and improved       condition.   3. Dehydration.  On admission, the patient was noted to be dehydrated.       The patient was hydrated with IV fluids and was euvolemic by day of       discharge.   4. Hypokalemia.  On day of discharge, the patient was noted to have a       potassium of 3.4.  The patient's potassium was repleted, and she       will be discharged in stable condition.      On day of discharge, vital signs temperature 98.8, pulse of 85, blood   pressure 109/65, respirations 16, satting 96% on room air.      DISCHARGE LABORATORIES:  Sodium of 139, potassium 3.4, chloride 111,   bicarb 24, glucose 104, BUN 6, creatinine 0.62 and a calcium of 7.6.   CBC:  White count of 17.8, hemoglobin 7.6, hematocrit 23.2 and a   platelet   count of 282.  Repeat hemoglobin and hematocrit done 8 hours later did   have a hemoglobin of 7.9 and a hematocrit of 24.7.      It was a pleasure taking care of Brianna Shaw.               Irine Seal, MD            DT/MEDQ  D:  03/27/2009  T:  03/27/2009  Job:  [id number]      cc:   Dr. [name] Coop   Dr. Amedeo Plenty      Electronically Signed by Irine Seal MD on 04/08/2009 11:43:54 AM

## 2010-02-10 NOTE — Letter (Signed)
Summary: Teena Irani MD  Teena Irani MD   Imported By: Bubba Hales 06/13/2009 09:36:44  _____________________________________________________________________  External Attachment:    Type:   Image     Comment:   External Document

## 2010-02-10 NOTE — Letter (Signed)
Summary: New Patient letter  [facility]  [address] [place], [place] [address]   Phone: [phone]  Fax: [phone]       04/25/2009 MRN: [id number]  [facility] [address] [place], [place]  [address]  Dear Ms. Brianna Shaw,  Welcome to the Gastroenterology Division at [facility].    You are scheduled to see Dr. Carlean Purl on 06-02-09 at 2:45p.m. on the 3rd floor at [facility] [place] [employer].  We ask that you try to arrive at our office 15 minutes prior to your appointment time to allow for check-in.  We would like you to complete the enclosed self-administered evaluation form prior to your visit and bring it with you on the day of your appointment.  We will review it with you.  Also, please bring a complete list of all your medications or, if you prefer, bring the medication bottles and we will list them.  Please bring your insurance card so that we may make a copy of it.  If your insurance requires a referral to see a specialist, please bring your referral form from your primary care physician.  Co-payments are due at the time of your visit and may be paid by cash, check or credit card.     Your office visit will consist of a consult with your physician (includes a physical exam), any laboratory testing he/she may order, scheduling of any necessary diagnostic testing (e.g. x-ray, ultrasound, CT-scan), and scheduling of a procedure (e.g. Endoscopy, Colonoscopy) if required.  Please allow enough time on your schedule to allow for any/all of these possibilities.    If you cannot keep your appointment, please call [phone] to cancel or reschedule prior to your appointment date.  This allows [place] the opportunity to schedule an appointment for another patient in need of care.  If you do not cancel or reschedule by 5 p.m. the business day prior to your appointment date, you will be charged a $50.00 late cancellation/no-show fee.    Thank you for choosing [facility] for your medical needs.  We appreciate the opportunity to care for you.  Please visit [place] at our website  to learn more about our practice.                     Sincerely,                                                             The Gastroenterology Division

## 2010-02-10 NOTE — Procedures (Signed)
Summary: Teena Irani MD  Teena Irani MD   Imported By: Bubba Hales 06/13/2009 09:56:58  _____________________________________________________________________  External Attachment:    Type:   Image     Comment:   External Document

## 2010-02-10 NOTE — Letter (Signed)
Summary: Office Visit Letter  [facility]  [address] [place], [place] [address]   Phone: [phone]  Fax: [phone]      December 03, 2009 MRN: [id number]   [place], [place]  [address]   Dear Ms. WEIGHT,   According to our records, it is time for you to schedule a follow-up office visit with [place].   At your convenience, please call [phone] (option #2)to schedule an office visit. If you have any questions, concerns, or feel that this letter is in error, we would appreciate your call.   Sincerely,   Gatha Mayer, M.D.  [facility] [phone]

## 2010-02-10 NOTE — Progress Notes (Signed)
Summary: cbc results  Phone Note Outgoing Call   Summary of Call: CBC is better with Hgb almost nl at 11.6 will recheck it and ferritin at next office visit - she could do those 2-3 days before if possible she should be seeing me late Nov or eraly Dec Gatha Mayer MD, [facility]  November 01, 2009 1:30 PM   Follow-up for Phone Call        left message for pt  to call back  Follow-up by: Marlon Pel CMA Deborra Medina),  November 03, 2009 9:48 AM  Additional Follow-up for Phone Call Additional follow up Details #1::        left message for pt  to call back  Additional Follow-up by: Marlon Pel CMA Deborra Medina),  November 04, 2009 8:57 AM    Additional Follow-up for Phone Call Additional follow up Details #2::    Appt scheduled for 12/17/09 at 2:30pm and pt notified. notified pt of lab results and told her to come in 2 days prior to get repeat CBC, Ferritin. Pt verbalized understanding and labs put in IDX. Follow-up by: Marlon Pel CMA Deborra Medina),  November 04, 2009 9:20 AM

## 2010-02-12 NOTE — Progress Notes (Signed)
Summary: Prep  Phone Note Call from Patient Call back at Home Phone [phone]   Caller: Patient Call For: Dr. Carlean Purl Reason for Call: Talk to Nurse Summary of Call: [facility] so she can [facility] for being a [facility] employee, rescheduled appt untill 03/06/10 Initial call taken by: Martinique Johnson,  January 06, 2010 9:34 AM  Follow-up for Phone Call        done. Patient aware Follow-up by: Barb Merino RN, CGRN,  January 06, 2010 9:45 AM    Prescriptions: REGLAN 10 MG  TABS (METOCLOPRAMIDE HCL) As per prep instructions.  #2 x 0   Entered by:   Barb Merino RN, CGRN   Authorized by:   Gatha Mayer MD, [facility]   Signed by:   Barb Merino RN, CGRN on 01/06/2010   Method used:   Electronically to        [facility] (retail)       [address]       [place], [place]  [address]       Ph: [phone]       Fax: [phone]   RxID:   [id number] DULCOLAX 5 MG  TBEC (BISACODYL) Day before procedure take 2 at 3pm and 2 at 8pm.  #4 x 0   Entered by:   Barb Merino RN, CGRN   Authorized by:   Gatha Mayer MD, [facility]   Signed by:   Barb Merino RN, CGRN on 01/06/2010   Method used:   Electronically to        [facility] (retail)       [address], [place]  [address]       Ph: [phone]       Fax: [phone]   RxID:   [id number] [place]   POWD (POLYETHYLENE GLYCOL 3350) As per prep  instructions.  #238 gm x 0   Entered by:   Barb Merino RN, CGRN   Authorized by:   Gatha Mayer MD, [facility]   Signed by:   Barb Merino RN, CGRN on 01/06/2010   Method used:   Electronically to        [facility] (retail)       [address]       [place], [place]  [address]       Ph: [phone]       Fax: [phone]   RxID:   [id number]

## 2010-02-12 NOTE — Progress Notes (Signed)
  Phone Note Other Incoming   Request: Send information Summary of Call: Request for records received from [employer]. Request forwarded to Garza-[place] II.  5 yrs-Gessner

## 2010-02-12 NOTE — Progress Notes (Signed)
  Phone Note Other Incoming   Request: Send information Summary of Call: Request for records received from [employer]. Request forwarded to [place].  Carlean Purl )

## 2010-03-03 ENCOUNTER — Telehealth: Payer: Self-pay | Admitting: Internal Medicine

## 2010-03-06 ENCOUNTER — Other Ambulatory Visit: Payer: Self-pay | Admitting: Internal Medicine

## 2010-03-06 ENCOUNTER — Other Ambulatory Visit (AMBULATORY_SURGERY_CENTER): Payer: PRIVATE HEALTH INSURANCE | Admitting: Internal Medicine

## 2010-03-06 ENCOUNTER — Encounter: Payer: Self-pay | Admitting: Internal Medicine

## 2010-03-06 DIAGNOSIS — Z86010 Personal history of colonic polyps: Secondary | ICD-10-CM

## 2010-03-06 DIAGNOSIS — Z1211 Encounter for screening for malignant neoplasm of colon: Secondary | ICD-10-CM

## 2010-03-06 DIAGNOSIS — K5190 Ulcerative colitis, unspecified, without complications: Secondary | ICD-10-CM

## 2010-03-06 DIAGNOSIS — K5289 Other specified noninfective gastroenteritis and colitis: Secondary | ICD-10-CM

## 2010-03-10 NOTE — Procedures (Addendum)
Summary: Colonoscopy  Patient: [name] Note: All result statuses are Final unless otherwise noted.  Tests: (1) Colonoscopy (COL)   COL Colonoscopy           [facility].     [place], [place]  [address]          COLONOSCOPY PROCEDURE REPORT          PATIENT:  Brianna Shaw, Brianna Shaw  MR#:  [id number]     BIRTHDATE:  1980/02/18, 29 yrs. old  GENDER:  female     ENDOSCOPIST:  Gatha Mayer, MD, [place]          PROCEDURE DATE:  03/06/2010     PROCEDURE:  Colonoscopy with biopsy     ASA CLASS:  Class I     INDICATIONS:  surveillance and high-risk screening, history of     pre-cancerous (adenomatous) colon polyps, evaluation of ulcerative     colitis     MEDICATIONS:   Fentanyl 50 mcg IV, Versed 8 mg          DESCRIPTION OF PROCEDURE:   After the risks benefits and     alternatives of the procedure were thoroughly explained, informed     consent was obtained.  Digital rectal exam was performed and     revealed no abnormalities.   The LB [id number]-[id number] endoscope     was introduced through the anus and advanced to the cecum, which     was identified by both the appendix and ileocecal valve, without     limitations.  The quality of the prep was excellent, using     MoviPrep.  The instrument was then slowly withdrawn as the colon     was fully examined.     <<PROCEDUREIMAGES>>          FINDINGS:  Abnormal appearing mucosa found scattered throughout     the colon. Areas of erythema and loss of vascular pattern but no     ulcers or friability. Random biopsies were obtained and sent to     pathology.  This was otherwise a normal examination of the colon.     Retroflexed views in the rectum revealed no abnormalities.    The     scope was then withdrawn from the patient and the procedure     completed.          COMPLICATIONS:  None     ENDOSCOPIC IMPRESSION:     1) Abnormal mucosa (erythema and loss of vascular pattern) found     scattered  throughout the colon, no clearly active colitis, though.     looks to be in remission or close to it. Biopsies pending     2) Otherwise normal examination, no polyps today. Prep was     excellent.          REPEAT EXAM:  In for Colonoscopy, pending biopsy results. Timing     of repeat most likely determined due to prior 8 mm rectal adenoma     09/2008, ulcerative colitis began in early 2010.          Gatha Mayer, MD, [facility]          CC:  [name], MD and The Patient          n.     eSIGNED:   Gatha Mayer at 03/06/2010 04:38 PM  [place], [place], [phone]  Note: An exclamation mark (!) indicates a result that was not dispersed into the flowsheet. Document Creation Date: 03/06/2010 4:38 PM _______________________________________________________________________  (1) Order result status: Final Collection or observation date-time: 03/06/2010 16:27 Requested date-time:  Receipt date-time:  Reported date-time:  Referring Physician:   Ordering Physician: [name] [phone]) Specimen Source:  Source: Tawanna Cooler Order Number: [id number] Lab site:   Appended Document: Colonoscopy     Procedures Next Due Date:    Colonoscopy: 03/2013

## 2010-03-10 NOTE — Progress Notes (Signed)
Summary: Procedures ?s  Phone Note Call from Patient Call back at Home Phone [phone]   Caller: Patient Call For: Ralene Muskrat Reason for Call: Talk to Nurse Summary of Call: Pt has questions about procedure for friday Initial call taken by: Martinique Johnson,  March 03, 2010 9:43 AM  Follow-up for Phone Call        I have reviewed new prep instructions with the patient.  She verblaized understanding of all instructions. Follow-up by: Barb Merino RN, [place],  March 03, 2010 9:54 AM

## 2010-03-11 ENCOUNTER — Telehealth (INDEPENDENT_AMBULATORY_CARE_PROVIDER_SITE_OTHER): Payer: Self-pay | Admitting: *Deleted

## 2010-03-15 ENCOUNTER — Encounter: Payer: Self-pay | Admitting: Internal Medicine

## 2010-03-16 ENCOUNTER — Telehealth: Payer: Self-pay | Admitting: Internal Medicine

## 2010-03-19 NOTE — Progress Notes (Signed)
  Phone Note Other Incoming   Request: Send information Summary of Call: Request for records received from [employer]. Request forwarded to [place].  01/2010 to present-Gessner

## 2010-03-24 NOTE — Letter (Signed)
Summary: Patient Notice- Colon Biospy Results  [facility]  [place], [place] [address]   Phone: [phone]  Fax: [phone]        March 15, 2010 MRN: [id number]    Brianna Shaw, [place]  [address]    Dear Ms. Brianna Shaw,  I am pleased to inform you that the biopsies taken during your recent colonoscopy did not show any evidence of active colitis upon pathologic examination. Your ulcerative colitis is in remission. You do need to keep taking your medication (Lialda) to keep things under control.  You should have a repeat colonoscopy examination due to your history of a pre-cancerous rectal polyp in the setting of ulcerative colitis, in 3 years (2015).  I would like you to have your CBC and ferritin rechecked in april and we plan to call you then.  Office follow-up routinely in 1 year, sooner as needed.  Please call us if you are having persistent problems or have questions about your condition that have not been fully answered at this time.  Sincerely,  Gatha Mayer MD, [facility]   This letter has been electronically signed by your physician.  Appended Document: Patient Notice- Colon Biospy Results letter mailed

## 2010-03-24 NOTE — Progress Notes (Signed)
Summary: Questions  Phone Note Call from Patient Call back at Home Phone [phone]   Caller: Patient Call For: Dr. Carlean Purl Reason for Call: Talk to Nurse Summary of Call: Patient has questions for the nurse Initial call taken by: Martinique Johnson,  March 16, 2010 9:53 AM  Follow-up for Phone Call        I spoke with the patient and she is advised of path results.  She can get her Lialda cheaper at [facility] and asks that it be sent there instead of [facility].  She is advised she needs labs around the 1st of April.  I have entered the orders into Epic. Follow-up by: Barb Merino RN, CGRN,  March 16, 2010 10:53 AM    Prescriptions: Doristine Johns 1.2 GM TBEC (MESALAMINE) two tablets by mouth two times a day  #120 x 11   Entered by:   Barb Merino RN, CGRN   Authorized by:   Gatha Mayer MD, [facility]   Signed by:   Barb Merino RN, CGRN on 03/16/2010   Method used:   Electronically to        [facility] [phone]* (retail)       [address] [place], [place]  [address]       Ph: [phone]       Fax: [phone]   RxID:   [id number]

## 2010-04-06 LAB — CBC
HCT: 23.2 % — ABNORMAL LOW (ref 36.0–46.0)
HCT: 24.8 % — ABNORMAL LOW (ref 36.0–46.0)
HCT: 28 % — ABNORMAL LOW (ref 36.0–46.0)
HCT: 30.2 % — ABNORMAL LOW (ref 36.0–46.0)
Hemoglobin: 7.6 g/dL — ABNORMAL LOW (ref 12.0–15.0)
Hemoglobin: 8 g/dL — ABNORMAL LOW (ref 12.0–15.0)
MCHC: 32.8 g/dL (ref 30.0–36.0)
MCHC: 33.2 g/dL (ref 30.0–36.0)
MCHC: 33.3 g/dL (ref 30.0–36.0)
MCV: 87.2 fL (ref 78.0–100.0)
MCV: 85.8 fL (ref 78.0–100.0)
MCV: 86.4 fL (ref 78.0–100.0)
Platelets: 282 10*3/uL (ref 150–400)
Platelets: 296 10*3/uL (ref 150–400)
Platelets: 321 10*3/uL (ref 150–400)
RBC: 2.86 MIL/uL — ABNORMAL LOW (ref 3.87–5.11)
RDW: 15.2 % (ref 11.5–15.5)
RDW: 16.2 % — ABNORMAL HIGH (ref 11.5–15.5)
RDW: 15.1 % (ref 11.5–15.5)
WBC: 16.3 10*3/uL — ABNORMAL HIGH (ref 4.0–10.5)
WBC: 16.3 10*3/uL — ABNORMAL HIGH (ref 4.0–10.5)
WBC: 19.7 10*3/uL — ABNORMAL HIGH (ref 4.0–10.5)

## 2010-04-06 LAB — CULTURE, BLOOD (ROUTINE X 2): Culture: NO GROWTH

## 2010-04-06 LAB — STOOL CULTURE

## 2010-04-06 LAB — BASIC METABOLIC PANEL
BUN: 6 mg/dL (ref 6–23)
CO2: 24 mEq/L (ref 19–32)
Calcium: 8 mg/dL — ABNORMAL LOW (ref 8.4–10.5)
Chloride: 111 mEq/L (ref 96–112)
GFR calc Af Amer: >60 mL/min (ref >60)
GFR calc non Af Amer: >60 mL/min (ref >60)
Glucose, Bld: 104 mg/dL — ABNORMAL HIGH (ref 70–99)
Glucose, Bld: 128 mg/dL — ABNORMAL HIGH (ref 70–99)
Potassium: 3.4 mEq/L — ABNORMAL LOW (ref 3.5–5.1)
Potassium: 3.7 mEq/L (ref 3.5–5.1)
Sodium: 139 mEq/L (ref 135–145)
Sodium: 139 mEq/L (ref 135–145)

## 2010-04-06 LAB — DIFFERENTIAL
Basophils Absolute: 0 10*3/uL (ref 0.0–0.1)
Basophils Absolute: 0 10*3/uL (ref 0.0–0.1)
Basophils Relative: 0 % (ref 0–1)
Eosinophils Relative: 0 % (ref 0–5)
Lymphocytes Relative: 7 % — ABNORMAL LOW (ref 12–46)
Lymphs Abs: 1.4 10*3/uL (ref 0.7–4.0)
Monocytes Absolute: 0.9 10*3/uL (ref 0.1–1.0)
Monocytes Relative: 4 % (ref 3–12)
Neutro Abs: 14.6 10*3/uL — ABNORMAL HIGH (ref 1.7–7.7)

## 2010-04-06 LAB — COMPREHENSIVE METABOLIC PANEL
ALT: 13 U/L (ref 0–35)
AST: 14 U/L (ref 0–37)
AST: 14 U/L (ref 0–37)
Albumin: 2.2 g/dL — ABNORMAL LOW (ref 3.5–5.2)
BUN: 18 mg/dL (ref 6–23)
CO2: 23 mEq/L (ref 19–32)
Calcium: 7.3 mg/dL — ABNORMAL LOW (ref 8.4–10.5)
Calcium: 8.5 mg/dL (ref 8.4–10.5)
Chloride: 104 mEq/L (ref 96–112)
Creatinine, Ser: 1.2 mg/dL (ref 0.4–1.2)
GFR calc Af Amer: >60 mL/min (ref >60)
GFR calc Af Amer: >60 mL/min (ref >60)
GFR calc non Af Amer: 53 mL/min — ABNORMAL LOW (ref >60)
Sodium: 134 mEq/L — ABNORMAL LOW (ref 135–145)
Total Bilirubin: 0.5 mg/dL (ref 0.3–1.2)
Total Protein: 5.9 g/dL — ABNORMAL LOW (ref 6.0–8.3)

## 2010-04-06 LAB — LIPASE, BLOOD: Lipase: 45 U/L (ref 23–300)

## 2010-04-06 LAB — URINALYSIS, ROUTINE W REFLEX MICROSCOPIC
Bilirubin Urine: NEGATIVE
Hgb urine dipstick: NEGATIVE
Ketones, ur: 15 mg/dL — AB
Ketones, ur: NEGATIVE mg/dL
Nitrite: NEGATIVE
Nitrite: NEGATIVE
Protein, ur: 100 mg/dL — AB
Urobilinogen, UA: 0.2 mg/dL (ref 0.0–1.0)
pH: 5.5 (ref 5.0–8.0)

## 2010-04-06 LAB — CLOSTRIDIUM DIFFICILE EIA: C difficile Toxins A+B, EIA: NEGATIVE

## 2010-04-06 LAB — ABO/RH: ABO/RH(D): B POS

## 2010-04-06 LAB — RETICULOCYTES: Retic Ct Pct: 0.7 % (ref 0.4–3.1)

## 2010-04-06 LAB — URINE CULTURE

## 2010-04-06 LAB — URINE MICROSCOPIC-ADD ON

## 2010-04-06 LAB — PREGNANCY, URINE: Preg Test, Ur: NEGATIVE

## 2010-04-06 LAB — RAPID STREP SCREEN (MED CTR MEBANE ONLY): Streptococcus, Group A Screen (Direct): NEGATIVE

## 2010-04-06 LAB — GIARDIA/CRYPTOSPORIDIUM SCREEN(EIA): Cryptosporidium Screen (EIA): NEGATIVE

## 2010-04-06 LAB — IRON AND TIBC: Iron: <10 ug/dL — ABNORMAL LOW (ref 42–135)

## 2010-04-06 LAB — GLUCOSE, CAPILLARY: Glucose-Capillary: 101 mg/dL — ABNORMAL HIGH (ref 70–99)

## 2010-04-13 ENCOUNTER — Telehealth: Payer: Self-pay

## 2010-04-13 ENCOUNTER — Other Ambulatory Visit: Payer: Self-pay | Admitting: Internal Medicine

## 2010-04-13 ENCOUNTER — Other Ambulatory Visit: Payer: PRIVATE HEALTH INSURANCE

## 2010-04-13 DIAGNOSIS — D500 Iron deficiency anemia secondary to blood loss (chronic): Secondary | ICD-10-CM

## 2010-04-13 LAB — POCT I-STAT, CHEM 8
Chloride: 104 mEq/L (ref 96–112)
HCT: 37 % (ref 36.0–46.0)
Potassium: 4.2 mEq/L (ref 3.5–5.1)
Sodium: 134 mEq/L — ABNORMAL LOW (ref 135–145)

## 2010-04-13 LAB — DIFFERENTIAL
Basophils Absolute: 0 10*3/uL (ref 0.0–0.1)
Basophils Relative: 0 % (ref 0–1)
Neutro Abs: 5.9 10*3/uL (ref 1.7–7.7)
Neutrophils Relative %: 68 % (ref 43–77)

## 2010-04-13 LAB — CBC
MCHC: 34.8 g/dL (ref 30.0–36.0)
RBC: 3.9 MIL/uL (ref 3.87–5.11)
RDW: 12.9 % (ref 11.5–15.5)

## 2010-04-13 NOTE — Telephone Encounter (Signed)
Patient's voicemail is full , I will continue to try and reach her about needing to come in for lab work.

## 2010-04-13 NOTE — Telephone Encounter (Signed)
Message copied by Barb Merino on Mon Apr 13, 2010  9:20 AM ------      Message from: Barb Merino      Created: Mon Mar 16, 2010 10:08 AM       Needs labs

## 2010-04-20 NOTE — Telephone Encounter (Signed)
I have left a voicemail for the patient it is time to come for repeat lab owrk.

## 2010-04-21 ENCOUNTER — Other Ambulatory Visit (INDEPENDENT_AMBULATORY_CARE_PROVIDER_SITE_OTHER): Payer: PRIVATE HEALTH INSURANCE

## 2010-04-21 DIAGNOSIS — D500 Iron deficiency anemia secondary to blood loss (chronic): Secondary | ICD-10-CM

## 2010-04-21 LAB — CBC WITH DIFFERENTIAL/PLATELET
Basophils Absolute: 0 10*3/uL (ref 0.0–0.1)
Basophils Relative: 0.6 % (ref 0.0–3.0)
Eosinophils Absolute: 0.2 10*3/uL (ref 0.0–0.7)
Eosinophils Relative: 3.5 % (ref 0.0–5.0)
HCT: 42.8 % (ref 36.0–46.0)
Hemoglobin: 14.9 g/dL (ref 12.0–15.0)
Lymphocytes Relative: 38.5 % (ref 12.0–46.0)
Lymphs Abs: 2.6 10*3/uL (ref 0.7–4.0)
MCHC: 34.8 g/dL (ref 30.0–36.0)
MCV: 94.2 fl (ref 78.0–100.0)
Monocytes Absolute: 0.5 10*3/uL (ref 0.1–1.0)
Monocytes Relative: 7.5 % (ref 3.0–12.0)
Neutro Abs: 3.4 10*3/uL (ref 1.4–7.7)
Neutrophils Relative %: 49.9 % (ref 43.0–77.0)
Platelets: 193 10*3/uL (ref 150.0–400.0)
RBC: 4.54 Mil/uL (ref 3.87–5.11)
RDW: 13.8 % (ref 11.5–14.6)
WBC: 6.9 10*3/uL (ref 4.5–10.5)

## 2010-04-23 LAB — GLUCOSE, CAPILLARY

## 2010-04-25 NOTE — Progress Notes (Signed)
Quick Note:  Cbc and ferritin normal No changes See me in October or november ______

## 2010-04-27 NOTE — Progress Notes (Signed)
Patient advised of results and to follow up this fall

## 2012-01-11 ENCOUNTER — Encounter (HOSPITAL_BASED_OUTPATIENT_CLINIC_OR_DEPARTMENT_OTHER): Payer: Self-pay | Admitting: Emergency Medicine

## 2012-01-11 ENCOUNTER — Emergency Department (HOSPITAL_BASED_OUTPATIENT_CLINIC_OR_DEPARTMENT_OTHER)
Admission: EM | Admit: 2012-01-11 | Discharge: 2012-01-12 | Disposition: A | Discharge status: Home / Self Care | Payer: Self-pay | Attending: Emergency Medicine | Admitting: Emergency Medicine

## 2012-01-11 ENCOUNTER — Emergency Department (HOSPITAL_BASED_OUTPATIENT_CLINIC_OR_DEPARTMENT_OTHER): Payer: Self-pay

## 2012-01-11 DIAGNOSIS — K529 Noninfective gastroenteritis and colitis, unspecified: Secondary | ICD-10-CM

## 2012-01-11 DIAGNOSIS — Z8619 Personal history of other infectious and parasitic diseases: Secondary | ICD-10-CM | POA: Insufficient documentation

## 2012-01-11 DIAGNOSIS — Z79899 Other long term (current) drug therapy: Secondary | ICD-10-CM | POA: Insufficient documentation

## 2012-01-11 DIAGNOSIS — R197 Diarrhea, unspecified: Secondary | ICD-10-CM | POA: Insufficient documentation

## 2012-01-11 DIAGNOSIS — K5289 Other specified noninfective gastroenteritis and colitis: Secondary | ICD-10-CM | POA: Insufficient documentation

## 2012-01-11 DIAGNOSIS — R6883 Chills (without fever): Secondary | ICD-10-CM | POA: Insufficient documentation

## 2012-01-11 DIAGNOSIS — R1110 Vomiting, unspecified: Secondary | ICD-10-CM | POA: Insufficient documentation

## 2012-01-11 DIAGNOSIS — K589 Irritable bowel syndrome without diarrhea: Secondary | ICD-10-CM | POA: Insufficient documentation

## 2012-01-11 HISTORY — DX: Lyme disease, unspecified: A69.20

## 2012-01-11 HISTORY — DX: Ulcerative colitis, unspecified, without complications: K51.90

## 2012-01-11 LAB — COMPREHENSIVE METABOLIC PANEL
ALT: 9 U/L (ref 0–35)
Albumin: 3 g/dL — ABNORMAL LOW (ref 3.5–5.2)
Calcium: 8.7 mg/dL (ref 8.4–10.5)
GFR calc Af Amer: >90 mL/min (ref >90)
Glucose, Bld: 86 mg/dL (ref 70–99)
Sodium: 140 mEq/L (ref 135–145)
Total Protein: 6.8 g/dL (ref 6.0–8.3)

## 2012-01-11 LAB — CBC WITH DIFFERENTIAL/PLATELET
Eosinophils Absolute: 0.4 10*3/uL (ref 0.0–0.7)
Eosinophils Relative: 5 % (ref 0–5)
Lymphs Abs: 2 10*3/uL (ref 0.7–4.0)
MCH: 32.6 pg (ref 26.0–34.0)
MCHC: 34.4 g/dL (ref 30.0–36.0)
MCV: 94.8 fL (ref 78.0–100.0)
Monocytes Absolute: 1 10*3/uL (ref 0.1–1.0)
Platelets: 231 10*3/uL (ref 150–400)
RBC: 4.26 MIL/uL (ref 3.87–5.11)
RDW: 12.8 % (ref 11.5–15.5)

## 2012-01-11 LAB — URINALYSIS, ROUTINE W REFLEX MICROSCOPIC
Leukocytes, UA: NEGATIVE
Nitrite: NEGATIVE
Specific Gravity, Urine: 1.038 — ABNORMAL HIGH (ref 1.005–1.030)
pH: 5.5 (ref 5.0–8.0)

## 2012-01-11 LAB — URINE MICROSCOPIC-ADD ON

## 2012-01-11 MED ORDER — IOHEXOL 300 MG/ML  SOLN
50.0000 mL | Freq: Once | INTRAMUSCULAR | Status: AC | PRN
  Administered 2012-01-11: 50 mL via ORAL

## 2012-01-11 MED ORDER — ONDANSETRON HCL 4 MG/2ML IJ SOLN
INTRAMUSCULAR | Status: AC
  Filled 2012-01-11: qty 2

## 2012-01-11 MED ORDER — METHYLPREDNISOLONE SODIUM SUCC 125 MG IJ SOLR
125.0000 mg | Freq: Once | INTRAMUSCULAR | Status: AC
  Administered 2012-01-11: 125 mg via INTRAVENOUS
  Filled 2012-01-11: qty 2

## 2012-01-11 MED ORDER — METRONIDAZOLE 500 MG PO TABS: 500.0000 mg | ORAL_TABLET | Freq: Two times a day (BID) | ORAL | Status: DC

## 2012-01-11 MED ORDER — IOHEXOL 300 MG/ML  SOLN
80.0000 mL | Freq: Once | INTRAMUSCULAR | Status: AC | PRN
  Administered 2012-01-11: 80 mL via INTRAVENOUS

## 2012-01-11 MED ORDER — IOHEXOL 300 MG/ML  SOLN: 25.0000 mL | INTRAMUSCULAR | Status: AC

## 2012-01-11 MED ORDER — CIPROFLOXACIN IN D5W 400 MG/200ML IV SOLN
400.0000 mg | Freq: Two times a day (BID) | INTRAVENOUS | Status: DC
  Administered 2012-01-11: 400 mg via INTRAVENOUS
  Filled 2012-01-11: qty 200

## 2012-01-11 MED ORDER — ONDANSETRON HCL 4 MG/2ML IJ SOLN
4.0000 mg | Freq: Once | INTRAMUSCULAR | Status: AC
  Administered 2012-01-11: 4 mg via INTRAVENOUS

## 2012-01-11 MED ORDER — PREDNISONE 10 MG PO TABS: ORAL_TABLET | ORAL | Status: DC

## 2012-01-11 MED ORDER — CIPROFLOXACIN HCL 500 MG PO TABS: 500.0000 mg | ORAL_TABLET | Freq: Two times a day (BID) | ORAL | Status: DC

## 2012-01-11 MED ORDER — METRONIDAZOLE IN NACL 5-0.79 MG/ML-% IV SOLN
500.0000 mg | Freq: Once | INTRAVENOUS | Status: AC
  Administered 2012-01-12: 500 mg via INTRAVENOUS
  Filled 2012-01-11: qty 100

## 2012-01-11 MED ORDER — ONDANSETRON 4 MG PO TBDP: 4.0000 mg | ORAL_TABLET | Freq: Three times a day (TID) | ORAL | Status: DC | PRN

## 2012-01-11 NOTE — ED Notes (Signed)
Pt. To get meds IV before discharge takes place.

## 2012-01-11 NOTE — ED Notes (Signed)
Pt. R 20g IV blew with attempt to flush the site.  Pt. In CT scan and RN Rosana Hoes replaced new IV site in the L FA with a 22g.

## 2012-01-11 NOTE — ED Provider Notes (Signed)
History     CSN: [id number]  Arrival date & time 01/11/12  98   First MD Initiated Contact with Patient 01/11/12 1729      Chief Complaint  Patient presents with  . Diarrhea    (Consider location/radiation/quality/duration/timing/severity/associated sxs/prior treatment) Patient is a 31 y.o. female presenting with abdominal pain. The history is provided by the patient. No language interpreter was used.  Abdominal Pain The primary symptoms of the illness include abdominal pain, vomiting and diarrhea. Episode onset: 1 week. The problem has been gradually worsening.  The patient states that she believes she is currently not pregnant. Additional symptoms associated with the illness include chills. Significant associated medical issues include inflammatory bowel disease.    Past Medical History  Diagnosis Date  . Lyme disease 2009  . Colitis, ulcerative     History reviewed. No pertinent past surgical history.  History reviewed. No pertinent family history.  History  Substance Use Topics  . Smoking status: Never Smoker   . Smokeless tobacco: Not on file  . Alcohol Use: Yes    OB History    Grav Para Term Preterm Abortions TAB SAB Ect Mult Living                  Review of Systems  Constitutional: Positive for chills.  Gastrointestinal: Positive for vomiting, abdominal pain and diarrhea.  All other systems reviewed and are negative.    Allergies  Flagyl  Home Medications   Current Outpatient Rx  Name  Route  Sig  Dispense  Refill  . MEDROXYPROGESTERONE ACETATE 150 MG/ML IM SUSP   Intramuscular   Inject 150 mg into the muscle every 3 (three) months.           BP 110/74  Pulse 115  Temp 97.5 F (36.4 C) (Oral)  Resp 18  SpO2 100%  LMP 01/11/2012  Physical Exam  Nursing note and vitals reviewed. Constitutional: She is oriented to person, place, and time. She appears well-developed and well-nourished.  HENT:  Head: Normocephalic and atraumatic.    Eyes: Pupils are equal, round, and reactive to light.  Neck: Normal range of motion.  Cardiovascular: Normal rate, regular rhythm and normal heart sounds.   Pulmonary/Chest: Effort normal and breath sounds normal.  Abdominal: Soft. There is tenderness.       Tender left and right lower abdomen  Musculoskeletal: Normal range of motion.  Neurological: She is alert and oriented to person, place, and time. She has normal reflexes.  Skin: Skin is warm.  Psychiatric: She has a normal mood and affect.    ED Course  Procedures (including critical care time)  Labs Reviewed  URINALYSIS, ROUTINE W REFLEX MICROSCOPIC - Abnormal; Notable for the following:    Color, Urine ORANGE (*)  BIOCHEMICALS MAY BE AFFECTED BY COLOR   APPearance CLOUDY (*)     Specific Gravity, Urine 1.038 (*)     Bilirubin Urine SMALL (*)     Ketones, ur 15 (*)     Protein, ur 30 (*)     All other components within normal limits  URINE MICROSCOPIC-ADD ON   No results found.   No diagnosis found.    MDM  Cr shows colitis.   Pt given cipro 560m IV,  Flagyl 500 mg IV,  Solumedrol 125 iv   Pt advised to follow up with Dr. GCarlean Purl  Return if symptoms worsen and change        LNazlini PUtah12/31/13 2248

## 2012-01-11 NOTE — ED Provider Notes (Signed)
Medical screening examination/treatment/procedure(s) were performed by non-physician practitioner and as supervising physician I was immediately available for consultation/collaboration.   Malvin Johns, MD 01/11/12 [phone]

## 2012-01-11 NOTE — ED Notes (Signed)
Diarrhea for 1 week. Vomiting since Saturday. No fever. Bloody diarrhea. Has h/o colitis.

## 2012-01-11 NOTE — ED Notes (Signed)
EMT at bedside to collect labs on Pt.

## 2012-01-11 NOTE — ED Notes (Signed)
Brandon Melnick PA made aware of lab draw attempt and we will attempt again after the bag of fluid has infused.

## 2012-01-26 ENCOUNTER — Telehealth: Payer: Self-pay | Admitting: Internal Medicine

## 2012-01-26 NOTE — Telephone Encounter (Signed)
I spoke with the patient's mother.  She needs an office visit she has not been seen since 2011.  She will call back if they need an appt.

## 2012-01-27 NOTE — Telephone Encounter (Signed)
Patient scheduled for Monday with Alonza Bogus, PA

## 2012-01-31 ENCOUNTER — Encounter: Payer: Self-pay | Admitting: Gastroenterology

## 2012-01-31 ENCOUNTER — Ambulatory Visit (INDEPENDENT_AMBULATORY_CARE_PROVIDER_SITE_OTHER): Payer: Self-pay | Admitting: Gastroenterology

## 2012-01-31 VITALS — BP 100/60 | HR 60 | Ht 65.05 in | Wt 104.0 lb

## 2012-01-31 DIAGNOSIS — K5100 Ulcerative (chronic) pancolitis without complications: Secondary | ICD-10-CM

## 2012-01-31 MED ORDER — PREDNISONE 10 MG PO TABS: ORAL_TABLET | ORAL | Status: DC

## 2012-01-31 MED ORDER — MESALAMINE 1.2 G PO TBEC: 2400.0000 mg | DELAYED_RELEASE_TABLET | Freq: Two times a day (BID) | ORAL | Status: DC

## 2012-01-31 MED ORDER — PROBIOTIC DAILY PO CAPS: 1.0000 | ORAL_CAPSULE | Freq: Every day | ORAL | Status: DC

## 2012-01-31 NOTE — Progress Notes (Signed)
01/31/2012 Brianna Shaw [id number] [birth year]   History of Present Illness: 32 year old female with UC who has been somewhat non-compliant with her medications.  She was last seen in 2012 at which time a colonoscopy revealed that her UC was in remission with the Lialda that she was taking.  She eventually lost her insurance, but was feeling well and tapered herself off of the [place].  She did well without the medication for a while, but then at the end of December she ended up in the ED with a UC flare.  CBC and CMP were WNL's, but CT scan showed long segment of colitis extending from the rectum to the hepatic flexure C/W mild active inflammatory bowel disease (was noted to be less severe than on comparison exam on 03/24/2009).  She was placed on prednisone (just a 6 day taper).  She also had some very old Lialda at home that she restarted as well.  She finished the 6 day prednisone taper, but says that it was not very effective so she put herself back on prednisone at 20 mg every 12 hours, which has been working well for her, but she does not know how well the Doristine Johns is working.  She still does not have insurance, but does not want to change medications since Lialda worked for her in the past so she is trying to get medication assistance.  She says that rectal bleeding is minimal; was having loose BM every hour, but improved since on the prednisone.  Some lower abdominal cramping but not severe.  Has not been eating much because she is afraid she will have move her bowels a lot.  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   Current Medications, Allergies, Past Medical History, Past Surgical History, Family History and Social History were reviewed in [employer] record.   Physical Exam: BP 100/60  Pulse 60  Ht 5' 5.05" (1.652 m)  Wt 104 lb (47.174 kg)  BMI 17.28 kg/m2  LMP 01/11/2012 General: Thin young female in no acute distress Head:  Normocephalic and atraumatic Eyes:  sclerae anicteric, conjunctiva pink  Ears: Normal auditory acuity Lungs: Clear throughout to auscultation Heart: Regular rate and rhythm Abdomen: Soft, non-distended.  Normal bowel sounds.  Mild lower abdominal TTP > in the LLQ without R/R/G. Rectal: Deferred Musculoskeletal: Symmetrical with no gross deformities  Extremities: No edema  Neurological: Alert oriented x 4, grossly nonfocal Psychological:  Alert and cooperative. Normal mood and affect  Assessment and Recommendations: -Ulcerative colitis with flare:  Patient does not currently have insurance so is trying to get assistance for her Lialda.  We will fill out that paper-work for her and in the meantime we will give her samples of the medication; will take 1.2 grams, two pills BID as she has done in the past.  I discussed with her about changing to something less expensive, but she would like to try and stay on this if possible since it worked for her in the past. Will keep her on prednisone 40 mg daily for two more weeks and then she will call the office with update and directions to taper the steroids depending on her status.  She can try metamucil and probiotics to see if they help give her stool more form and help with the gas, respectively.  Will follow-up with Dr. Carlean Purl in about one month.

## 2012-01-31 NOTE — Patient Instructions (Addendum)
Continue your Prednisone as follows:  Take Prednisone 62m tablets , 2 tablets every 12 hours.  Today we are giving you samples of Lialda 1.2g and we will get the patient assistance forms filled out for you to pick up.  Try a probiotic, we are giving you samples of PClintonto try.   Call [place] in 2 weeks with an update of how your doing.  Make an appointment to see Dr. GCarlean Purlin 4 weeks for a follow up visit.

## 2012-01-31 NOTE — Progress Notes (Signed)
Reviewed and agree with management plan.  Rosellen Lichtenberger T. Quadre Bristol, MD FACG 

## 2012-02-14 ENCOUNTER — Telehealth: Payer: Self-pay | Admitting: Internal Medicine

## 2012-02-14 MED ORDER — FLUCONAZOLE 150 MG PO TABS: 150.0000 mg | ORAL_TABLET | Freq: Once | ORAL | Status: DC

## 2012-02-14 NOTE — Telephone Encounter (Signed)
Unable to leave a message for the patient I will continue to try and reach her.  Mailbox is unable to receive messages at this time

## 2012-02-14 NOTE — Telephone Encounter (Signed)
This is Dr. Celesta Aver patient

## 2012-02-14 NOTE — Telephone Encounter (Addendum)
Patient is on 30 mg of prednisone and was advised to call back with an update on her symptoms for directions on taper.    She reports that she is having solid stools, no diarrhea or urgency.  She does report oral thrush from the prednisone and is requesting diflucan.  Please advise on diflucan and directions fror prednisone taper.

## 2012-02-14 NOTE — Telephone Encounter (Signed)
1) Fluconazole 150 mg po x 1 # 1 refill 2) prednisone 20 mg daily x 5 days, 15 mg daily x 5 days, 10 mg daily x 5 days, 5 mg daily x 5 days and stop 3) Keep f/u visit

## 2012-02-14 NOTE — Telephone Encounter (Signed)
Patient aware.  Her insurance goes into effect in March. She has rescheduled to then

## 2012-02-15 ENCOUNTER — Telehealth: Payer: Self-pay | Admitting: Internal Medicine

## 2012-02-15 NOTE — Telephone Encounter (Signed)
I have left a detailed message for the patient with Dr. Celesta Aver recommendations.  She is asked to call back for any questions or concerns

## 2012-02-15 NOTE — Telephone Encounter (Signed)
Patient reports knee pain that started yesterday, but this am is much worse.  She is wondering if the prednisone is causing the pain.  Please advise

## 2012-02-15 NOTE — Telephone Encounter (Signed)
More likely Lialda Have her hold that and see if it improves

## 2012-02-16 ENCOUNTER — Telehealth: Payer: Self-pay | Admitting: Internal Medicine

## 2012-02-16 NOTE — Telephone Encounter (Signed)
Reduce prednisone to 20 mg total daily now and phone follow-up early next week

## 2012-02-16 NOTE — Telephone Encounter (Signed)
Patient advised that may take a few days for the Lialda to get out of her system.  She is convinced the joint pains are coming from prednisone.  She is asking if the taper could be done faster?

## 2012-02-16 NOTE — Telephone Encounter (Signed)
I have left a message for the patient with Dr. Celesta Aver She is asked to call back with any questions and an update next week

## 2012-02-21 ENCOUNTER — Telehealth: Payer: Self-pay | Admitting: Internal Medicine

## 2012-02-21 NOTE — Telephone Encounter (Signed)
Patient advised how to taper Prednisone and about ok to restart Lialda, will call back if knee pain returns.  Has appointment with Dr. Carlean Purl March 1st 2014.  She verbalized understanding instructions.

## 2012-02-21 NOTE — Telephone Encounter (Signed)
Please advise Sir, thank you. 

## 2012-02-21 NOTE — Telephone Encounter (Signed)
15 mg prednisone daily x 5 days then go to 10 mg daily x 5 days and then 5 mg daily til she sees me  May restart Lialda - let us know if knee pain returns

## 2012-02-26 ENCOUNTER — Other Ambulatory Visit: Payer: Self-pay

## 2012-02-29 ENCOUNTER — Ambulatory Visit: Payer: Self-pay | Admitting: Internal Medicine

## 2012-03-13 ENCOUNTER — Ambulatory Visit (INDEPENDENT_AMBULATORY_CARE_PROVIDER_SITE_OTHER): Payer: BC Managed Care – PPO | Admitting: Internal Medicine

## 2012-03-13 ENCOUNTER — Encounter: Payer: Self-pay | Admitting: Internal Medicine

## 2012-03-13 VITALS — BP 92/68 | HR 80 | Ht 65.5 in | Wt 109.0 lb

## 2012-03-13 DIAGNOSIS — Z860100 Personal history of colon polyps, unspecified: Secondary | ICD-10-CM

## 2012-03-13 DIAGNOSIS — K5100 Ulcerative (chronic) pancolitis without complications: Secondary | ICD-10-CM

## 2012-03-13 NOTE — Progress Notes (Signed)
  Subjective:    Patient ID: Brianna Shaw, female    DOB: [birth year], 32 y.o.   MRN: [id number]  HPI She is here for follow-up after UC flare. Had stopped Lialda - was uninsured. Treated with prednisone in Dec/Jan. Lialda restarted w/ compassionate use program. She is better - no diarrhea or bleeding or abdominal pain. Has insurance through health exchange now.  Allergies  Allergen Reactions  . Flagyl (Metronidazole) Nausea And Vomiting   Outpatient Prescriptions Prior to Visit  Medication Sig Dispense Refill  . medroxyPROGESTERone (DEPO-PROVERA) 150 MG/ML injection Inject 150 mg into the muscle every 3 (three) months.      . mesalamine (LIALDA) 1.2 G EC tablet Take 2 tablets (2.4 g total) by mouth 2 (two) times daily.  144 tablet  0  . Probiotic Product (PROBIOTIC DAILY) CAPS Take 1 capsule by mouth daily.      . Prednisone 5 mg daily     .      .      .      Marland Kitchen ondansetron (ZOFRAN ODT) 4 MG disintegrating tablet Take 1 tablet (4 mg total) by mouth every 8 (eight) hours as needed for nausea.  20 tablet  0    Past Medical History  Diagnosis Date  . Lyme disease 2009  . Colitis, ulcerative    Review of Systems As above    Objective:   Physical Exam General:  NAD Eyes:   anicteric Abdomen:  soft and nontender, BS+ Ext:   no edema    Data Reviewed:  01/2012 GI note Labs 2012 colonoscopy report      Assessment & Plan:  Ulcerative colitis, universal - improved now back on therapy  1. Dc prednisone 2. Stay on Lialda 2.4 g bid - she is to contact when needs refill 3. See me in about 9 months for routine follow-up, sooner prn 4. Colonoscopy 2015 ([place] and hx rectal adenoma)

## 2012-03-13 NOTE — Patient Instructions (Addendum)
Follow up with [place] in 9 months.  Let us know when you need an rx of the [place].  Stop your prednisone.  Thank you for choosing me and [facility].  Gatha Mayer, M.D., [facility]

## 2012-11-16 ENCOUNTER — Other Ambulatory Visit: Payer: Self-pay

## 2013-02-26 ENCOUNTER — Encounter: Payer: Self-pay | Admitting: Internal Medicine

## 2013-05-08 ENCOUNTER — Telehealth: Payer: Self-pay | Admitting: Internal Medicine

## 2013-05-08 MED ORDER — MESALAMINE 1.2 G PO TBEC: 2400.0000 mg | DELAYED_RELEASE_TABLET | Freq: Two times a day (BID) | ORAL | Status: DC

## 2013-05-08 NOTE — Telephone Encounter (Signed)
I spoke with the patient.  She is not having a flare.  She wants to get a refill to prevent a flare.  She is set up for a follow up in June and rx refills sent

## 2013-07-04 ENCOUNTER — Encounter: Payer: Self-pay | Admitting: Internal Medicine

## 2013-07-04 ENCOUNTER — Other Ambulatory Visit (INDEPENDENT_AMBULATORY_CARE_PROVIDER_SITE_OTHER): Payer: BC Managed Care – PPO

## 2013-07-04 ENCOUNTER — Ambulatory Visit (INDEPENDENT_AMBULATORY_CARE_PROVIDER_SITE_OTHER): Payer: BC Managed Care – PPO | Admitting: Internal Medicine

## 2013-07-04 VITALS — BP 104/80 | HR 60 | Ht 65.5 in | Wt 118.6 lb

## 2013-07-04 DIAGNOSIS — K5100 Ulcerative (chronic) pancolitis without complications: Secondary | ICD-10-CM

## 2013-07-04 DIAGNOSIS — Z86010 Personal history of colonic polyps: Secondary | ICD-10-CM

## 2013-07-04 LAB — COMPREHENSIVE METABOLIC PANEL
ALBUMIN: 4 g/dL (ref 3.5–5.2)
ALK PHOS: 61 U/L (ref 39–117)
ALT: 31 U/L (ref 0–35)
AST: 27 U/L (ref 0–37)
BILIRUBIN TOTAL: 0.7 mg/dL (ref 0.2–1.2)
BUN: 10 mg/dL (ref 6–23)
CO2: 33 mEq/L — ABNORMAL HIGH (ref 19–32)
Calcium: 9.6 mg/dL (ref 8.4–10.5)
Chloride: 103 mEq/L (ref 96–112)
Creatinine, Ser: 0.8 mg/dL (ref 0.4–1.2)
GFR: 94.22 mL/min (ref >60.00)
GLUCOSE: 91 mg/dL (ref 70–99)
POTASSIUM: 4.1 meq/L (ref 3.5–5.1)
Sodium: 140 mEq/L (ref 135–145)
Total Protein: 7.6 g/dL (ref 6.0–8.3)

## 2013-07-04 LAB — CBC WITH DIFFERENTIAL/PLATELET
BASOS PCT: 0.6 % (ref 0.0–3.0)
Basophils Absolute: 0 10*3/uL (ref 0.0–0.1)
EOS ABS: 0.2 10*3/uL (ref 0.0–0.7)
EOS PCT: 3.2 % (ref 0.0–5.0)
HEMATOCRIT: 41.9 % (ref 36.0–46.0)
HEMOGLOBIN: 14 g/dL (ref 12.0–15.0)
LYMPHS ABS: 2.2 10*3/uL (ref 0.7–4.0)
Lymphocytes Relative: 34.7 % (ref 12.0–46.0)
MCHC: 33.4 g/dL (ref 30.0–36.0)
MCV: 94.7 fl (ref 78.0–100.0)
MONO ABS: 0.5 10*3/uL (ref 0.1–1.0)
Monocytes Relative: 8.1 % (ref 3.0–12.0)
NEUTROS ABS: 3.4 10*3/uL (ref 1.4–7.7)
Neutrophils Relative %: 53.4 % (ref 43.0–77.0)
Platelets: 236 10*3/uL (ref 150.0–400.0)
RBC: 4.42 Mil/uL (ref 3.87–5.11)
RDW: 14.2 % (ref 11.5–15.5)
WBC: 6.4 10*3/uL (ref 4.0–10.5)

## 2013-07-04 MED ORDER — MESALAMINE 1.2 G PO TBEC: 2.4000 g | DELAYED_RELEASE_TABLET | Freq: Every day | ORAL | Status: DC

## 2013-07-04 NOTE — Assessment & Plan Note (Signed)
Repeat colonoscopy later this year 09/2013 recall

## 2013-07-04 NOTE — Patient Instructions (Signed)
Your physician has requested that you go to the basement for the following lab work before leaving today: CBC/diff, CMET  We have sent the following medications to your pharmacy for you to pick up at your convenience: Brianna Shaw  We will put a colonoscopy recall in for September 2015.    I appreciate the opportunity to care for you.

## 2013-07-04 NOTE — Assessment & Plan Note (Signed)
Doing well overall 2.4 g Lialda daily Refill Routine f/u colonoscopy later this year - recall 09/2013

## 2013-07-04 NOTE — Progress Notes (Signed)
         Subjective:    Patient ID: Brianna Shaw, female    DOB: April 21, 1980, 33 y.o.   MRN: [id number]  HPI Doing well on Lialda now at 2.4 g/d. Has a week of diarrhea in April - broke up with boyfriend and some work stress  She is working prn at [employer] still  Medications, allergies, past medical history, past surgical history, family history and social history are reviewed and updated in the EMR.  Review of Systems     Objective:   Physical Exam WDWN NAD Tanned skin    Assessment & Plan:  COLITIS, ULCERATIVE, UNIVERSAL Doing well overall 2.4 g Lialda daily Refill Routine f/u colonoscopy later this year - recall 09/2013  COLONIC POLYPS, ADENOMATOUS, HX OF Repeat colonoscopy later this year 09/2013 recall

## 2013-11-14 ENCOUNTER — Encounter: Payer: Self-pay | Admitting: Internal Medicine

## 2013-11-28 ENCOUNTER — Telehealth: Payer: Self-pay

## 2013-11-28 NOTE — Telephone Encounter (Signed)
Brianna Shaw (mom) dropped off [facility] assistance forms for patients Lialda.  Dr. Carlean Purl signed and I will mail these out tomorrow per mom's ok.  A copy of the form minus her pay stubs will be scanned into the system.  Patient's address updated in the system , it is mom's address because [name] may be moving in there.

## 2013-12-13 ENCOUNTER — Telehealth: Payer: Self-pay | Admitting: Internal Medicine

## 2013-12-13 DIAGNOSIS — K5100 Ulcerative (chronic) pancolitis without complications: Secondary | ICD-10-CM

## 2013-12-13 MED ORDER — MESALAMINE 1.2 G PO TBEC: 2.4000 g | DELAYED_RELEASE_TABLET | Freq: Every day | ORAL | Status: DC

## 2013-12-13 NOTE — Telephone Encounter (Signed)
Spoke with mom Arbie Cookey , questions answered and put Lialda samples up front for pick up.  We are awaiting to hear from [facility] about assistance.  No rx needed at this time since we are giving her samples.

## 2013-12-19 NOTE — H&P (Signed)
TOTAL HIP ADMISSION H&P  Patient is admitted for left total hip arthroplasty, anterior approach.  Subjective:  Chief Complaint:    Left hip AVN with collapse /  pain  HPI: Brianna Shaw, 33 y.o. female, has a history of pain and functional disability in the left hip(s) due to arthritis and patient has failed non-surgical conservative treatments for greater than 12 weeks to include NSAID's and/or analgesics, supervised PT with diminished ADL's post treatment and activity modification.  Onset of symptoms was abrupt starting in January 2015 with gradually worsening course since that time.The patient noted no past surgery on the left hip(s).  Patient currently rates pain in the left hip at 8 out of 10 with activity. Patient has night pain, worsening of pain with activity and weight bearing, trendelenberg gait, pain that interfers with activities of daily living and pain with passive range of motion. Patient has evidence of periarticular osteophytes, joint space narrowing and severe AVN on MRI by imaging studies. This condition presents safety issues increasing the risk of falls.  There is no current active infection.   Risks, benefits and expectations were discussed with the patient.  Risks including but not limited to the risk of anesthesia, blood clots, nerve damage, blood vessel damage, failure of the prosthesis, infection and up to and including death.  Patient understand the risks, benefits and expectations and wishes to proceed with surgery.   PCP: Brianna Gainer, MD  D/C Plans:      Home with HHPT  Post-op Meds:       No Rx given  Tranexamic Acid:      To be given - IV    Decadron:      Is to be given  FYI:     ASA  Norco     Patient Active Problem List   Diagnosis Date Noted  . COLONIC POLYPS, ADENOMATOUS, HX OF 06/03/2009  . COLITIS, ULCERATIVE, UNIVERSAL 06/02/2009   Past Medical History  Diagnosis Date  . Lyme disease 2009  . Colitis, ulcerative   . Personal history of  colonic polyps     Past Surgical History  Procedure Laterality Date  . Colonoscopy  multiple    No prescriptions prior to admission   Allergies  Allergen Reactions  . Flagyl [Metronidazole] Nausea And Vomiting    History  Substance Use Topics  . Smoking status: Never Smoker   . Smokeless tobacco: Never Used  . Alcohol Use: Yes    Family History  Problem Relation Age of Onset  . Breast cancer Mother   . Diabetes Maternal Grandmother   . Heart disease Maternal Grandfather   . Diabetes Paternal Grandfather   . Colon polyps Paternal Grandmother      Review of Systems  Constitutional: Negative.   HENT: Negative.   Eyes: Negative.   Respiratory: Negative.   Cardiovascular: Negative.   Gastrointestinal: Positive for heartburn.  Genitourinary: Negative.   Musculoskeletal: Positive for joint pain.  Skin: Negative.   Neurological: Negative.   Endo/Heme/Allergies: Negative.   Psychiatric/Behavioral: The patient has insomnia.     Objective:  Physical Exam  Constitutional: She is oriented to person, place, and time. She appears well-developed and well-nourished.  HENT:  Head: Normocephalic and atraumatic.  Eyes: Pupils are equal, round, and reactive to light.  Neck: Neck supple. No JVD present. No tracheal deviation present. No thyromegaly present.  Cardiovascular: Normal rate, regular rhythm, normal heart sounds and intact distal pulses.   Respiratory: Effort normal and breath sounds normal. No  stridor. No respiratory distress. She has no wheezes.  GI: Soft. There is no tenderness. There is no guarding.  Musculoskeletal:       Left hip: She exhibits decreased range of motion, decreased strength, tenderness and bony tenderness. She exhibits no swelling, no deformity and no laceration.  Lymphadenopathy:    She has no cervical adenopathy.  Neurological: She is alert and oriented to person, place, and time.  Skin: Skin is warm and dry.  Psychiatric: She has a normal mood  and affect.       Labs:  Estimated body mass index is 19.43 kg/(m^2) as calculated from the following:   Height as of 07/04/13: 5' 5.5" (1.664 m).   Weight as of 07/04/13: 53.797 kg (118 lb 9.6 oz).   Imaging Review Plain radiographs demonstrate severe  AVN  of the left hip(s). The bone quality appears to be good for age and reported activity level.  Assessment/Plan:  End stage arthritis, left hip(s)  The patient history, physical examination, clinical judgement of the provider and imaging studies are consistent with end stage degenerative joint disease of the left hip(s) and total hip arthroplasty is deemed medically necessary. The treatment options including medical management, injection therapy, arthroscopy and arthroplasty were discussed at length. The risks and benefits of total hip arthroplasty were presented and reviewed. The risks due to aseptic loosening, infection, stiffness, dislocation/subluxation,  thromboembolic complications and other imponderables were discussed.  The patient acknowledged the explanation, agreed to proceed with the plan and consent was signed. Patient is being admitted for inpatient treatment for surgery, pain control, PT, OT, prophylactic antibiotics, VTE prophylaxis, progressive ambulation and ADL's and discharge planning.The patient is planning to be discharged home with home health services.    Brianna Pugh Damarius Karnes   PA-C  12/19/2013, 11:09 PM

## 2013-12-26 NOTE — Patient Instructions (Addendum)
Brianna Shaw  12/26/2013   Your procedure is scheduled on: Tuesday 01/01/2014  Report to [facility]  Entrance and follow signs to               [place] at 48 PM.  Call this number if you have problems the morning of surgery [phone]   Remember:MAY HAVE CLEAR LIQUIDS FROM MIDNIGHT UP UNTIL 0940 AM THEN [place]!  Do not eat food or drink liquids :After Midnight.     Take these medicines the morning of surgery with A SIP OF WATER: Lialda                               You may not have any metal on your body including hair pins and              piercings  Do not wear jewelry, make-up, lotions, powders or perfumes.             Do not wear nail polish.  Do not shave  48 hours prior to surgery.              Men may shave face and neck.   Do not bring valuables to the hospital. [place].  Contacts, dentures or bridgework may not be worn into surgery.  Leave suitcase in the car. After surgery it may be brought to your room.     Patients discharged the day of surgery will not be allowed to drive home.  Name and phone number of your driver:  Special Instructions: N/A              Please read over the following fact sheets you were given: _____________________________________________________________________             [facility] - Preparing for Surgery Before surgery, you can play an important role.  Because skin is not sterile, your skin needs to be as free of germs as possible.  You can reduce the number of germs on your skin by washing with CHG (chlorahexidine gluconate) soap before surgery.  CHG is an antiseptic cleaner which kills germs and bonds with the skin to continue killing germs even after washing. Please DO NOT use if you have an allergy to CHG or antibacterial soaps.  If your skin becomes reddened/irritated stop using the CHG and inform your nurse when you arrive at  Short Stay. Do not shave (including legs and underarms) for at least 48 hours prior to the first CHG shower.  You may shave your face/neck. Please follow these instructions carefully:  1.  Shower with CHG Soap the night before surgery and the  morning of Surgery.  2.  If you choose to wash your hair, wash your hair first as usual with your  normal  shampoo.  3.  After you shampoo, rinse your hair and body thoroughly to remove the  shampoo.                           4.  Use CHG as you would any other liquid soap.  You can apply chg directly  to the skin and wash  Gently with a scrungie or clean washcloth.  5.  Apply the CHG Soap to your body ONLY FROM THE NECK DOWN.   Do not use on face/ open                           Wound or open sores. Avoid contact with eyes, ears mouth and genitals (private parts).                       Wash face,  Genitals (private parts) with your normal soap.             6.  Wash thoroughly, paying special attention to the area where your surgery  will be performed.  7.  Thoroughly rinse your body with warm water from the neck down.  8.  DO NOT shower/wash with your normal soap after using and rinsing off  the CHG Soap.                9.  Pat yourself dry with a clean towel.            10.  Wear clean pajamas.            11.  Place clean sheets on your bed the night of your first shower and do not  sleep with pets. Day of Surgery : Do not apply any lotions/deodorants the morning of surgery.  Please wear clean clothes to the hospital/surgery center.  FAILURE TO FOLLOW THESE INSTRUCTIONS MAY RESULT IN THE CANCELLATION OF YOUR SURGERY PATIENT SIGNATURE_________________________________  NURSE SIGNATURE__________________________________  ________________________________________________________________________   Adam Phenix  An incentive spirometer is a tool that can help keep your lungs clear and active. This tool measures how well you are  filling your lungs with each breath. Taking long deep breaths may help reverse or decrease the chance of developing breathing (pulmonary) problems (especially infection) following:  A long period of time when you are unable to move or be active. BEFORE THE PROCEDURE   If the spirometer includes an indicator to show your best effort, your nurse or respiratory therapist will set it to a desired goal.  If possible, sit up straight or lean slightly forward. Try not to slouch.  Hold the incentive spirometer in an upright position. INSTRUCTIONS FOR USE   Sit on the edge of your bed if possible, or sit up as far as you can in bed or on a chair.  Hold the incentive spirometer in an upright position.  Breathe out normally.  Place the mouthpiece in your mouth and seal your lips tightly around it.  Breathe in slowly and as deeply as possible, raising the piston or the ball toward the top of the column.  Hold your breath for 3-5 seconds or for as long as possible. Allow the piston or ball to fall to the bottom of the column.  Remove the mouthpiece from your mouth and breathe out normally.  Rest for a few seconds and repeat Steps 1 through 7 at least 10 times every 1-2 hours when you are awake. Take your time and take a few normal breaths between deep breaths.  The spirometer may include an indicator to show your best effort. Use the indicator as a goal to work toward during each repetition.  After each set of 10 deep breaths, practice coughing to be sure your lungs are clear. If you have an incision (the cut made at the time of surgery),  support your incision when coughing by placing a pillow or rolled up towels firmly against it. Once you are able to get out of bed, walk around indoors and cough well. You may stop using the incentive spirometer when instructed by your caregiver.  RISKS AND COMPLICATIONS  Take your time so you do not get dizzy or light-headed.  If you are in pain, you may  need to take or ask for pain medication before doing incentive spirometry. It is harder to take a deep breath if you are having pain. AFTER USE  Rest and breathe slowly and easily.  It can be helpful to keep track of a log of your progress. Your caregiver can provide you with a simple table to help with this. If you are using the spirometer at home, follow these instructions: [place] IF:   You are having difficultly using the spirometer.  You have trouble using the spirometer as often as instructed.  Your pain medication is not giving enough relief while using the spirometer.  You develop fever of 100.5 F (38.1 C) or higher. SEEK IMMEDIATE MEDICAL CARE IF:   You cough up bloody sputum that had not been present before.  You develop fever of 102 F (38.9 C) or greater.  You develop worsening pain at or near the incision site. MAKE SURE YOU:   Understand these instructions.  Will watch your condition.  Will get help right away if you are not doing well or get worse. Document Released: 05/10/2006 Document Revised: 03/22/2011 Document Reviewed: 07/11/2006 ExitCare Patient Information 2014 ExitCare, [place].   ________________________________________________________________________  WHAT IS A BLOOD TRANSFUSION? Blood Transfusion Information  A transfusion is the replacement of blood or some of its parts. Blood is made up of multiple cells which provide different functions.  Red blood cells carry oxygen and are used for blood loss replacement.  White blood cells fight against infection.  Platelets control bleeding.  Plasma helps clot blood.  Other blood products are available for specialized needs, such as hemophilia or other clotting disorders. BEFORE THE TRANSFUSION  Who gives blood for transfusions?   Healthy volunteers who are fully evaluated to make sure their blood is safe. This is blood bank blood. Transfusion therapy is the safest it has ever been in  the practice of medicine. Before blood is taken from a donor, a complete history is taken to make sure that person has no history of diseases nor engages in risky social behavior (examples are intravenous drug use or sexual activity with multiple partners). The donor's travel history is screened to minimize risk of transmitting infections, such as malaria. The donated blood is tested for signs of infectious diseases, such as HIV and hepatitis. The blood is then tested to be sure it is compatible with you in order to minimize the chance of a transfusion reaction. If you or a relative donates blood, this is often done in anticipation of surgery and is not appropriate for emergency situations. It takes many days to process the donated blood. RISKS AND COMPLICATIONS Although transfusion therapy is very safe and saves many lives, the main dangers of transfusion include:   Getting an infectious disease.  Developing a transfusion reaction. This is an allergic reaction to something in the blood you were given. Every precaution is taken to prevent this. The decision to have a blood transfusion has been considered carefully by your caregiver before blood is given. Blood is not given unless the benefits outweigh the risks. AFTER THE TRANSFUSION  Right after receiving a blood transfusion, you will usually feel much better and more energetic. This is especially true if your red blood cells have gotten low (anemic). The transfusion raises the level of the red blood cells which carry oxygen, and this usually causes an energy increase.  The nurse administering the transfusion will monitor you carefully for complications. HOME CARE INSTRUCTIONS  No special instructions are needed after a transfusion. You may find your energy is better. Speak with your caregiver about any limitations on activity for underlying diseases you may have. SEEK MEDICAL CARE IF:   Your condition is not improving after your  transfusion.  You develop redness or irritation at the intravenous (IV) site. SEEK IMMEDIATE MEDICAL CARE IF:  Any of the following symptoms occur over the next 12 hours:  Shaking chills.  You have a temperature by mouth above 102 F (38.9 C), not controlled by medicine.  Chest, back, or muscle pain.  People around you feel you are not acting correctly or are confused.  Shortness of breath or difficulty breathing.  Dizziness and fainting.  You get a rash or develop hives.  You have a decrease in urine output.  Your urine turns a dark color or changes to pink, red, or brown. Any of the following symptoms occur over the next 10 days:  You have a temperature by mouth above 102 F (38.9 C), not controlled by medicine.  Shortness of breath.  Weakness after normal activity.  The white part of the eye turns yellow (jaundice).  You have a decrease in the amount of urine or are urinating less often.  Your urine turns a dark color or changes to pink, red, or brown. Document Released: 12/26/1999 Document Revised: 03/22/2011 Document Reviewed: 08/14/2007 ExitCare Patient Information 2014 ExitCare, [place].  _______________________________________________________________________   CLEAR LIQUID DIET   Foods Allowed                                                                     Foods Excluded  Coffee and tea, regular and decaf                             liquids that you cannot  Plain Jell-O in any flavor                                             see through such as: Fruit ices (not with fruit pulp)                                     milk, soups, orange juice  Iced Popsicles                                    All solid food Carbonated beverages, regular and diet  Cranberry, grape and apple juices Sports drinks like Gatorade Lightly seasoned clear broth or consume(fat free) Sugar, honey syrup  Sample Menu Breakfast                                 Lunch                                     Supper Cranberry juice                    Beef broth                            Chicken broth Jell-O                                     Grape juice                           Apple juice Coffee or tea                        Jell-O                                      Popsicle                                                Coffee or tea                        Coffee or tea  _____________________________________________________________________

## 2013-12-27 ENCOUNTER — Encounter (HOSPITAL_COMMUNITY)
Admission: RE | Admit: 2013-12-27 | Discharge: 2013-12-27 | Disposition: A | Discharge status: Home / Self Care | Payer: BC Managed Care – PPO | Source: Ambulatory Visit | Attending: Orthopedic Surgery | Admitting: Orthopedic Surgery

## 2013-12-27 ENCOUNTER — Encounter (HOSPITAL_COMMUNITY): Payer: Self-pay

## 2013-12-27 DIAGNOSIS — Z01812 Encounter for preprocedural laboratory examination: Secondary | ICD-10-CM | POA: Diagnosis not present

## 2013-12-27 LAB — CBC
HCT: 42.7 % (ref 36.0–46.0)
Hemoglobin: 13.8 g/dL (ref 12.0–15.0)
MCH: 31.6 pg (ref 26.0–34.0)
MCHC: 32.3 g/dL (ref 30.0–36.0)
MCV: 97.7 fL (ref 78.0–100.0)
Platelets: 241 10*3/uL (ref 150–400)
RBC: 4.37 MIL/uL (ref 3.87–5.11)
RDW: 13.1 % (ref 11.5–15.5)
WBC: 6.6 10*3/uL (ref 4.0–10.5)

## 2013-12-27 LAB — PROTIME-INR
INR: 0.93 (ref 0.00–1.49)
Prothrombin Time: 12.5 seconds (ref 11.6–15.2)

## 2013-12-27 LAB — BASIC METABOLIC PANEL
Anion gap: 14 (ref 5–15)
BUN: 15 mg/dL (ref 6–23)
CHLORIDE: 99 meq/L (ref 96–112)
CO2: 24 mEq/L (ref 19–32)
Calcium: 9.9 mg/dL (ref 8.4–10.5)
Creatinine, Ser: 0.84 mg/dL (ref 0.50–1.10)
GFR calc non Af Amer: 90 mL/min — ABNORMAL LOW (ref >90)
Glucose, Bld: 94 mg/dL (ref 70–99)
POTASSIUM: 4.3 meq/L (ref 3.7–5.3)
SODIUM: 137 meq/L (ref 137–147)

## 2013-12-27 LAB — URINALYSIS, ROUTINE W REFLEX MICROSCOPIC
Bilirubin Urine: NEGATIVE
Glucose, UA: NEGATIVE mg/dL
Hgb urine dipstick: NEGATIVE
Ketones, ur: NEGATIVE mg/dL
Leukocytes, UA: NEGATIVE
NITRITE: NEGATIVE
PH: 6 (ref 5.0–8.0)
Protein, ur: NEGATIVE mg/dL
Specific Gravity, Urine: 1.027 (ref 1.005–1.030)
Urobilinogen, UA: 0.2 mg/dL (ref 0.0–1.0)

## 2013-12-27 LAB — SURGICAL PCR SCREEN
MRSA, PCR: NEGATIVE
Staphylococcus aureus: NEGATIVE

## 2013-12-27 LAB — HCG, SERUM, QUALITATIVE: PREG SERUM: NEGATIVE

## 2013-12-27 LAB — APTT: APTT: 27 s (ref 24–37)

## 2013-12-27 LAB — ABO/RH: ABO/RH(D): B POS

## 2014-01-01 ENCOUNTER — Inpatient Hospital Stay (HOSPITAL_COMMUNITY): Payer: BC Managed Care – PPO | Admitting: Registered Nurse

## 2014-01-01 ENCOUNTER — Encounter (HOSPITAL_COMMUNITY)
Admission: RE | Disposition: A | Discharge status: Home / Self Care | Payer: Self-pay | Source: Ambulatory Visit | Attending: Orthopedic Surgery

## 2014-01-01 ENCOUNTER — Inpatient Hospital Stay (HOSPITAL_COMMUNITY): Payer: BC Managed Care – PPO

## 2014-01-01 ENCOUNTER — Inpatient Hospital Stay (HOSPITAL_COMMUNITY)
Admission: RE | Admit: 2014-01-01 | Discharge: 2014-01-02 | DRG: 470 | Disposition: A | Discharge status: Home / Self Care | Payer: BC Managed Care – PPO | Source: Ambulatory Visit | Attending: Orthopedic Surgery | Admitting: Orthopedic Surgery

## 2014-01-01 ENCOUNTER — Encounter (HOSPITAL_COMMUNITY): Payer: Self-pay | Admitting: *Deleted

## 2014-01-01 DIAGNOSIS — Z86010 Personal history of colonic polyps: Secondary | ICD-10-CM

## 2014-01-01 DIAGNOSIS — M879 Osteonecrosis, unspecified: Principal | ICD-10-CM | POA: Diagnosis present

## 2014-01-01 DIAGNOSIS — Z888 Allergy status to other drugs, medicaments and biological substances status: Secondary | ICD-10-CM | POA: Diagnosis not present

## 2014-01-01 DIAGNOSIS — Z96649 Presence of unspecified artificial hip joint: Secondary | ICD-10-CM

## 2014-01-01 DIAGNOSIS — M1612 Unilateral primary osteoarthritis, left hip: Secondary | ICD-10-CM | POA: Diagnosis present

## 2014-01-01 DIAGNOSIS — Z96642 Presence of left artificial hip joint: Secondary | ICD-10-CM | POA: Insufficient documentation

## 2014-01-01 HISTORY — PX: TOTAL HIP ARTHROPLASTY: SHX124

## 2014-01-01 LAB — TYPE AND SCREEN
ABO/RH(D): B POS
Antibody Screen: NEGATIVE

## 2014-01-01 SURGERY — ARTHROPLASTY, HIP, TOTAL, ANTERIOR APPROACH
Anesthesia: Spinal | Site: Hip | Laterality: Left

## 2014-01-01 MED ORDER — MAGNESIUM CITRATE PO SOLN: 1.0000 | Freq: Once | ORAL | Status: AC | PRN

## 2014-01-01 MED ORDER — ONDANSETRON HCL 4 MG/2ML IJ SOLN
INTRAMUSCULAR | Status: DC | PRN
  Administered 2014-01-01: 4 mg via INTRAVENOUS

## 2014-01-01 MED ORDER — MEPERIDINE HCL 50 MG/ML IJ SOLN
6.2500 mg | INTRAMUSCULAR | Status: DC | PRN
  Administered 2014-01-01: 12.5 mg via INTRAVENOUS

## 2014-01-01 MED ORDER — HYDROMORPHONE HCL 1 MG/ML IJ SOLN
0.5000 mg | INTRAMUSCULAR | Status: DC | PRN
  Administered 2014-01-01: 0.5 mg via INTRAVENOUS
  Administered 2014-01-02 (×2): 1 mg via INTRAVENOUS
  Administered 2014-01-02: 0.5 mg via INTRAVENOUS
  Filled 2014-01-01 (×4): qty 1

## 2014-01-01 MED ORDER — DEXAMETHASONE SODIUM PHOSPHATE 10 MG/ML IJ SOLN
10.0000 mg | Freq: Once | INTRAMUSCULAR | Status: DC
  Filled 2014-01-01: qty 1

## 2014-01-01 MED ORDER — PHENYLEPHRINE HCL 10 MG/ML IJ SOLN
INTRAMUSCULAR | Status: DC | PRN
  Administered 2014-01-01 (×5): 40 ug via INTRAVENOUS

## 2014-01-01 MED ORDER — BISACODYL 10 MG RE SUPP: 10.0000 mg | Freq: Every day | RECTAL | Status: DC | PRN

## 2014-01-01 MED ORDER — DIPHENHYDRAMINE HCL 25 MG PO CAPS: 25.0000 mg | ORAL_CAPSULE | Freq: Four times a day (QID) | ORAL | Status: DC | PRN

## 2014-01-01 MED ORDER — PROPOFOL 10 MG/ML IV BOLUS
INTRAVENOUS | Status: AC
  Filled 2014-01-01: qty 20

## 2014-01-01 MED ORDER — MIDAZOLAM HCL 5 MG/5ML IJ SOLN
INTRAMUSCULAR | Status: DC | PRN
  Administered 2014-01-01: 2 mg via INTRAVENOUS

## 2014-01-01 MED ORDER — CEFAZOLIN SODIUM-DEXTROSE 2-3 GM-% IV SOLR
2.0000 g | INTRAVENOUS | Status: AC
  Administered 2014-01-01: 2 g via INTRAVENOUS

## 2014-01-01 MED ORDER — MENTHOL 3 MG MT LOZG: 1.0000 | LOZENGE | OROMUCOSAL | Status: DC | PRN

## 2014-01-01 MED ORDER — SODIUM CHLORIDE 0.9 % IR SOLN
Status: DC | PRN
  Administered 2014-01-01: 1000 mL

## 2014-01-01 MED ORDER — FENTANYL CITRATE 0.05 MG/ML IJ SOLN
INTRAMUSCULAR | Status: DC | PRN
  Administered 2014-01-01: 50 ug via INTRAVENOUS

## 2014-01-01 MED ORDER — ONDANSETRON HCL 4 MG/2ML IJ SOLN
INTRAMUSCULAR | Status: AC
  Filled 2014-01-01: qty 2

## 2014-01-01 MED ORDER — HYDROMORPHONE HCL 1 MG/ML IJ SOLN: 0.2500 mg | INTRAMUSCULAR | Status: DC | PRN

## 2014-01-01 MED ORDER — ASPIRIN EC 325 MG PO TBEC
325.0000 mg | DELAYED_RELEASE_TABLET | Freq: Two times a day (BID) | ORAL | Status: DC
  Filled 2014-01-01 (×3): qty 1

## 2014-01-01 MED ORDER — CEFAZOLIN SODIUM-DEXTROSE 2-3 GM-% IV SOLR
2.0000 g | Freq: Four times a day (QID) | INTRAVENOUS | Status: AC
  Administered 2014-01-01 – 2014-01-02 (×2): 2 g via INTRAVENOUS
  Filled 2014-01-01 (×2): qty 50

## 2014-01-01 MED ORDER — PROPOFOL INFUSION 10 MG/ML OPTIME
INTRAVENOUS | Status: DC | PRN
  Administered 2014-01-01: 75 ug/kg/min via INTRAVENOUS

## 2014-01-01 MED ORDER — SODIUM CHLORIDE 0.9 % IV SOLN
100.0000 mL/h | INTRAVENOUS | Status: DC
  Administered 2014-01-01: 100 mL/h via INTRAVENOUS
  Filled 2014-01-01 (×3): qty 1000

## 2014-01-01 MED ORDER — CEFAZOLIN SODIUM-DEXTROSE 2-3 GM-% IV SOLR
INTRAVENOUS | Status: AC
  Filled 2014-01-01: qty 50

## 2014-01-01 MED ORDER — PHENOL 1.4 % MT LIQD: 1.0000 | OROMUCOSAL | Status: DC | PRN

## 2014-01-01 MED ORDER — FERROUS SULFATE 325 (65 FE) MG PO TABS
325.0000 mg | ORAL_TABLET | Freq: Three times a day (TID) | ORAL | Status: DC
  Filled 2014-01-01 (×4): qty 1

## 2014-01-01 MED ORDER — MIDAZOLAM HCL 2 MG/2ML IJ SOLN
INTRAMUSCULAR | Status: AC
  Filled 2014-01-01: qty 2

## 2014-01-01 MED ORDER — DEXAMETHASONE SODIUM PHOSPHATE 10 MG/ML IJ SOLN
10.0000 mg | Freq: Once | INTRAMUSCULAR | Status: AC
  Administered 2014-01-01: 10 mg via INTRAVENOUS

## 2014-01-01 MED ORDER — CHLORHEXIDINE GLUCONATE 4 % EX LIQD: 60.0000 mL | Freq: Once | CUTANEOUS | Status: DC

## 2014-01-01 MED ORDER — PHENYLEPHRINE 40 MCG/ML (10ML) SYRINGE FOR IV PUSH (FOR BLOOD PRESSURE SUPPORT)
PREFILLED_SYRINGE | INTRAVENOUS | Status: AC
  Filled 2014-01-01: qty 10

## 2014-01-01 MED ORDER — METHOCARBAMOL 1000 MG/10ML IJ SOLN
500.0000 mg | Freq: Four times a day (QID) | INTRAVENOUS | Status: DC | PRN
  Administered 2014-01-01 – 2014-01-02 (×2): 500 mg via INTRAVENOUS
  Filled 2014-01-01 (×3): qty 5

## 2014-01-01 MED ORDER — CELECOXIB 200 MG PO CAPS
200.0000 mg | ORAL_CAPSULE | Freq: Two times a day (BID) | ORAL | Status: DC
  Administered 2014-01-01 – 2014-01-02 (×2): 200 mg via ORAL
  Filled 2014-01-01 (×3): qty 1

## 2014-01-01 MED ORDER — DOCUSATE SODIUM 100 MG PO CAPS
100.0000 mg | ORAL_CAPSULE | Freq: Two times a day (BID) | ORAL | Status: DC
  Administered 2014-01-01: 100 mg via ORAL

## 2014-01-01 MED ORDER — SODIUM CHLORIDE 0.9 % IV SOLN
1000.0000 mg | Freq: Once | INTRAVENOUS | Status: AC
  Administered 2014-01-01: 1000 mg via INTRAVENOUS
  Filled 2014-01-01: qty 10

## 2014-01-01 MED ORDER — POLYVINYL ALCOHOL 1.4 % OP SOLN
1.0000 [drp] | Freq: Every day | OPHTHALMIC | Status: DC | PRN
  Filled 2014-01-01: qty 15

## 2014-01-01 MED ORDER — MEPERIDINE HCL 50 MG/ML IJ SOLN
INTRAMUSCULAR | Status: AC
  Filled 2014-01-01: qty 1

## 2014-01-01 MED ORDER — PROMETHAZINE HCL 25 MG/ML IJ SOLN: 6.2500 mg | INTRAMUSCULAR | Status: DC | PRN

## 2014-01-01 MED ORDER — ONDANSETRON HCL 4 MG PO TABS: 4.0000 mg | ORAL_TABLET | Freq: Four times a day (QID) | ORAL | Status: DC | PRN

## 2014-01-01 MED ORDER — POLYETHYLENE GLYCOL 3350 17 G PO PACK
17.0000 g | PACK | Freq: Two times a day (BID) | ORAL | Status: DC
  Administered 2014-01-01: 17 g via ORAL

## 2014-01-01 MED ORDER — LACTATED RINGERS IV SOLN
INTRAVENOUS | Status: DC
  Administered 2014-01-01: 1000 mL via INTRAVENOUS
  Administered 2014-01-01 (×3): via INTRAVENOUS

## 2014-01-01 MED ORDER — METHOCARBAMOL 500 MG PO TABS: 500.0000 mg | ORAL_TABLET | Freq: Four times a day (QID) | ORAL | Status: DC | PRN

## 2014-01-01 MED ORDER — DEXAMETHASONE SODIUM PHOSPHATE 10 MG/ML IJ SOLN
INTRAMUSCULAR | Status: AC
  Filled 2014-01-01: qty 1

## 2014-01-01 MED ORDER — HYDROCODONE-ACETAMINOPHEN 7.5-325 MG PO TABS
1.0000 | ORAL_TABLET | ORAL | Status: DC
  Administered 2014-01-01 – 2014-01-02 (×4): 2 via ORAL
  Filled 2014-01-01 (×4): qty 2

## 2014-01-01 MED ORDER — METOCLOPRAMIDE HCL 10 MG PO TABS: 5.0000 mg | ORAL_TABLET | Freq: Three times a day (TID) | ORAL | Status: DC | PRN

## 2014-01-01 MED ORDER — ONDANSETRON HCL 4 MG/2ML IJ SOLN
4.0000 mg | Freq: Four times a day (QID) | INTRAMUSCULAR | Status: DC | PRN
  Administered 2014-01-02: 4 mg via INTRAVENOUS
  Filled 2014-01-01: qty 2

## 2014-01-01 MED ORDER — BUPIVACAINE HCL (PF) 0.5 % IJ SOLN
INTRAMUSCULAR | Status: DC | PRN
  Administered 2014-01-01: 3 mL

## 2014-01-01 MED ORDER — PROPOFOL 10 MG/ML IV BOLUS
INTRAVENOUS | Status: DC | PRN
  Administered 2014-01-01 (×3): 20 mg via INTRAVENOUS

## 2014-01-01 MED ORDER — FENTANYL CITRATE 0.05 MG/ML IJ SOLN
INTRAMUSCULAR | Status: AC
  Filled 2014-01-01: qty 2

## 2014-01-01 MED ORDER — MESALAMINE 1.2 G PO TBEC
2.4000 g | DELAYED_RELEASE_TABLET | Freq: Every day | ORAL | Status: DC
  Administered 2014-01-02: 2.4 g via ORAL
  Filled 2014-01-01: qty 2

## 2014-01-01 MED ORDER — METOCLOPRAMIDE HCL 5 MG/ML IJ SOLN: 5.0000 mg | Freq: Three times a day (TID) | INTRAMUSCULAR | Status: DC | PRN

## 2014-01-01 MED ORDER — ALUM & MAG HYDROXIDE-SIMETH 200-200-20 MG/5ML PO SUSP: 30.0000 mL | ORAL | Status: DC | PRN

## 2014-01-01 SURGICAL SUPPLY — 41 items
BAG SPEC THK2 15X12 ZIP CLS (MISCELLANEOUS)
BAG ZIPLOCK 12X15 (MISCELLANEOUS) IMPLANT
CAPT HIP TOTAL 3 ×1 IMPLANT
COVER PERINEAL POST (MISCELLANEOUS) ×2 IMPLANT
DRAPE C-ARM 42X120 X-RAY (DRAPES) ×2 IMPLANT
DRAPE STERI IOBAN 125X83 (DRAPES) ×2 IMPLANT
DRAPE U-SHAPE 47X51 STRL (DRAPES) ×6 IMPLANT
DRSG AQUACEL AG ADV 3.5X10 (GAUZE/BANDAGES/DRESSINGS) ×2 IMPLANT
DURAPREP 26ML APPLICATOR (WOUND CARE) ×2 IMPLANT
ELECT BLADE TIP CTD 4 INCH (ELECTRODE) ×2 IMPLANT
ELECT PENCIL ROCKER SW 15FT (MISCELLANEOUS) ×1 IMPLANT
ELECT REM PT RETURN 15FT ADLT (MISCELLANEOUS) ×1 IMPLANT
ELECT REM PT RETURN 9FT ADLT (ELECTROSURGICAL) ×2
ELECTRODE REM PT RTRN 9FT ADLT (ELECTROSURGICAL) ×1 IMPLANT
FACESHIELD WRAPAROUND (MASK) ×8 IMPLANT
FACESHIELD WRAPAROUND OR TEAM (MASK) ×4 IMPLANT
GLOVE BIOGEL PI IND STRL 7.5 (GLOVE) ×1 IMPLANT
GLOVE BIOGEL PI IND STRL 8.5 (GLOVE) ×1 IMPLANT
GLOVE BIOGEL PI INDICATOR 7.5 (GLOVE) ×1
GLOVE BIOGEL PI INDICATOR 8.5 (GLOVE) ×1
GLOVE ECLIPSE 8.0 STRL XLNG CF (GLOVE) ×4 IMPLANT
GLOVE ORTHO TXT STRL SZ7.5 (GLOVE) ×2 IMPLANT
GOWN SPEC L3 XXLG W/TWL (GOWN DISPOSABLE) ×2 IMPLANT
GOWN STRL REUS W/TWL LRG LVL3 (GOWN DISPOSABLE) ×2 IMPLANT
HOLDER FOLEY CATH W/STRAP (MISCELLANEOUS) ×2 IMPLANT
KIT BASIN OR (CUSTOM PROCEDURE TRAY) ×2 IMPLANT
LIQUID BAND (GAUZE/BANDAGES/DRESSINGS) ×2 IMPLANT
PACK TOTAL JOINT (CUSTOM PROCEDURE TRAY) ×2 IMPLANT
SAW OSC TIP CART 19.5X105X1.3 (SAW) ×2 IMPLANT
SCREW 6.5MMX25MM (Screw) ×1 IMPLANT
SCREW 6.5MMX40MM (Screw) ×1 IMPLANT
SUCTION FRAZIER 12FR DISP (SUCTIONS) ×1 IMPLANT
SUT MNCRL AB 4-0 PS2 18 (SUTURE) ×2 IMPLANT
SUT VIC AB 1 CT1 36 (SUTURE) ×6 IMPLANT
SUT VIC AB 2-0 CT1 27 (SUTURE) ×4
SUT VIC AB 2-0 CT1 TAPERPNT 27 (SUTURE) ×2 IMPLANT
SUT VLOC 180 0 24IN GS25 (SUTURE) ×2 IMPLANT
TOWEL OR 17X26 10 PK STRL BLUE (TOWEL DISPOSABLE) ×2 IMPLANT
TOWEL OR NON WOVEN STRL DISP B (DISPOSABLE) IMPLANT
TRAY FOLEY CATH 14FRSI W/METER (CATHETERS) ×2 IMPLANT
WATER STERILE IRR 1500ML POUR (IV SOLUTION) ×2 IMPLANT

## 2014-01-01 NOTE — Anesthesia Preprocedure Evaluation (Addendum)
Anesthesia Evaluation  Patient identified by MRN, date of birth, ID band Patient awake    Reviewed: Allergy & Precautions, H&P , NPO status , Patient's Chart, lab work & pertinent test results  Airway Mallampati: II  TM Distance: >3 FB Neck ROM: Full    Dental no notable dental hx.    Pulmonary neg pulmonary ROS,  breath sounds clear to auscultation  Pulmonary exam normal       Cardiovascular negative cardio ROS  Rhythm:Regular Rate:Normal     Neuro/Psych negative neurological ROS  negative psych ROS   GI/Hepatic Neg liver ROS, PUD,   Endo/Other  negative endocrine ROS  Renal/GU negative Renal ROS     Musculoskeletal negative musculoskeletal ROS (+)   Abdominal   Peds  Hematology negative hematology ROS (+)   Anesthesia Other Findings   Reproductive/Obstetrics negative OB ROS                            Anesthesia Physical Anesthesia Plan  ASA: II  Anesthesia Plan:    Post-op Pain Management:    Induction:   Airway Management Planned:   Additional Equipment:   Intra-op Plan:   Post-operative Plan:   Informed Consent: I have reviewed the patients History and Physical, chart, labs and discussed the procedure including the risks, benefits and alternatives for the proposed anesthesia with the patient or authorized representative who has indicated his/her understanding and acceptance.   Dental advisory given  Plan Discussed with: CRNA  Anesthesia Plan Comments:         Anesthesia Quick Evaluation

## 2014-01-01 NOTE — Interval H&P Note (Signed)
History and Physical Interval Note:  01/01/2014 1:52 PM  Brianna Shaw  has presented today for surgery, with the diagnosis of left hip AVN with clollapse  The various methods of treatment have been discussed with the patient and family. After consideration of risks, benefits and other options for treatment, the patient has consented to  Procedure(s): LEFT TOTAL HIP ARTHROPLASTY ANTERIOR APPROACH (Left) as a surgical intervention .  The patient's history has been reviewed, patient examined, no change in status, stable for surgery.  I have reviewed the patient's chart and labs.  Questions were answered to the patient's satisfaction.     Mauri Pole

## 2014-01-01 NOTE — Transfer of Care (Signed)
Immediate Anesthesia Transfer of Care Note  Patient: Brianna Shaw  Procedure(s) Performed: Procedure(s) (LRB): LEFT TOTAL HIP ARTHROPLASTY ANTERIOR APPROACH (Left)  Patient Location: PACU  Anesthesia Type: Spinal  Level of Consciousness: sedated, patient cooperative and responds to stimulation  Airway & Oxygen Therapy: Patient Spontanous Breathing and Patient connected to face mask oxgen  Post-op Assessment: Report given to PACU RN and Post -op Vital signs reviewed and stable  Post vital signs: Reviewed and stable  Complications: No apparent anesthesia complications

## 2014-01-01 NOTE — Op Note (Signed)
NAME:  Brianna Shaw NO.: [id number]      MEDICAL RECORD NO.: [id number]      FACILITY:  [facility]      PHYSICIAN:  Paralee Cancel D  DATE OF BIRTH:  1980-10-02     DATE OF PROCEDURE:  01/01/2014                                 OPERATIVE REPORT         PREOPERATIVE DIAGNOSIS: Left  hip avascular necrosis.      POSTOPERATIVE DIAGNOSIS:  Left hip avascular necrosis.      PROCEDURE:  Left total hip replacement through an anterior approach   utilizing DePuy THR system, component size 62m Gription pinnacle cup, a size 28    Ceramax ceramic liner, a size 5 Hi Tri Lock stem with a 28+1.5 delta ceramic   ball.      SURGEON:  MPietro Cassis OAlvan Dame M.D.      ASSISTANT:  MDanae Orleans PA-C      ANESTHESIA:  Spinal.      SPECIMENS:  None.      COMPLICATIONS:  None.      BLOOD LOSS:  200 cc     DRAINS:  One Hemovac.      INDICATION OF THE PROCEDURE:  Brianna LANYONis a 33y.o. female who had   presented to office for evaluation of left hip pain.  Radiographs revealed avascular necrosis with femoral head collapse with greater than 50% of the femoral head involved.  She had a painful limited range of   motion significantly affecting their overall quality of life.  The patient was failing to    respond to conservative measures, and at this point was ready   to proceed with more definitive measures.  The patient has noted progressive   degenerative changes in his hip, progressive problems and dysfunction   with regarding the hip prior to surgery.  Consent was obtained for   benefit of pain relief.  Specific risk of infection, DVT, component   failure, dislocation, need for revision surgery, as well discussion of   the anterior versus posterior approach were reviewed.  Consent was   obtained for benefit of anterior pain relief through an anterior   approach.      PROCEDURE IN DETAIL:  The patient was brought to operative theater.   Once  adequate anesthesia, preoperative antibiotics, 2gm of Ancef administered.   The patient was positioned supine on the OSI Hanna table.  Once adequate   padding of boney process was carried out, we had predraped out the hip, and  used fluoroscopy to confirm orientation of the pelvis and position.      The left hip was then prepped and draped from proximal iliac crest to   mid thigh with shower curtain technique.      Time-out was performed identifying the patient, planned procedure, and   extremity.     An incision was then made 2 cm distal and lateral to the   anterior superior iliac spine extending over the orientation of the   tensor fascia lata muscle and sharp dissection was carried down to the   fascia of the muscle and protractor placed in the soft tissues.      The fascia was then incised.  The  muscle belly was identified and swept   laterally and retractor placed along the superior neck.  Following   cauterization of the circumflex vessels and removing some pericapsular   fat, a second cobra retractor was placed on the inferior neck.  A third   retractor was placed on the anterior acetabulum after elevating the   anterior rectus.  A L-capsulotomy was along the line of the   superior neck to the trochanteric fossa, then extended proximally and   distally.  Tag sutures were placed and the retractors were then placed   intracapsular.  We then identified the trochanteric fossa and   orientation of my neck cut, confirmed this radiographically   and then made a neck osteotomy with the femur on traction.  The femoral   head was removed without difficulty or complication.  Traction was let   off and retractors were placed posterior and anterior around the   acetabulum.      The labrum and foveal tissue were debrided.  I began reaming with a 36m   reamer and reamed up to 412mreamer with good bony bed preparation and a 5077mription cup was selected based on native acetabular  retroversion and deficient posterior wall.  The final 71m75miption  Pinnacle cup was then impacted under fluoroscopy  to confirm the depth of penetration and orientation with respect to   abduction.  A screw was placed followed by the hole eliminator.  The final   28 neutral Ceramax ceramic liner was impacted with good visualized rim fit and no fracture of the ceramic liner.  The cup was positioned anatomically within the acetabular portion of the pelvis.      At this point, the femur was rolled at 80 degrees.  Further capsule was   released off the inferior aspect of the femoral neck.  I then   released the superior capsule proximally.  The hook was placed laterally   along the femur and elevated manually and held in position with the bed   hook.  The leg was then extended and adducted with the leg rolled to 100   degrees of external rotation.  Once the proximal femur was fully   exposed, I used a box osteotome to set orientation.  I then began   broaching with the starting chili pepper broach and passed this by hand and then broached up to 5.  With the 5 broach in place I chose a high offset neck and did a trial reduction.  The offset was appropriate, leg lengths   appeared to be equal, confirmed radiographically.   Given these findings, I went ahead and dislocated the hip, repositioned all   retractors and positioned the right hip in the extended and abducted position.  The final 5 Hi Tri Lock stem was   chosen and it was impacted down to the level of neck cut.  Based on this   and the trial reduction, a 28+1.5 delta ceramic ball was chosen and   impacted onto a clean and dry trunnion, and the hip was reduced.  The   hip had been irrigated throughout the case again at this point.  I did   reapproximate the superior capsular leaflet to the anterior leaflet   using #1 Vicryl.  The fascia of the   tensor fascia lata muscle was then reapproximated using #1 Vicryl and #0 V-lock sutures.  The    remaining wound was closed with 2-0 Vicryl and running 4-0 Monocryl.  The hip was cleaned, dried, and dressed sterilely using Dermabond and   Aquacel dressing.  She was then brought   to recovery room in stable condition tolerating the procedure well.   She was noted to have very obvious osteopenic bone changes in her femur and acetabulum.  I have told her mother and will recommend for now calcium and vitamin D treatment with appropriate follow up or referrals.  Also, when in doubt I will have her rest to allow for muscle and bone healing.   Danae Orleans, PA-C was present for the entirety of the case involved from   preoperative positioning, perioperative retractor management, general   facilitation of the case, as well as primary wound closure as assistant.            Pietro Cassis Alvan Dame, M.D.        01/01/2014 6:03 PM

## 2014-01-01 NOTE — Anesthesia Postprocedure Evaluation (Signed)
Anesthesia Post Note  Patient: Brianna Shaw  Procedure(s) Performed: Procedure(s) (LRB): LEFT TOTAL HIP ARTHROPLASTY ANTERIOR APPROACH (Left)  Anesthesia type: Spinal  Patient location: PACU  Post pain: Pain level controlled  Post assessment: Post-op Vital signs reviewed  Last Vitals: BP 113/73 mmHg  Pulse 83  Temp(Src) 36.9 C (Oral)  Resp 23  Ht 5' 5"  (1.651 m)  Wt 123 lb (55.792 kg)  BMI 20.47 kg/m2  SpO2 100%  Post vital signs: Reviewed  Level of consciousness: sedated  Complications: No apparent anesthesia complications

## 2014-01-01 NOTE — Anesthesia Procedure Notes (Signed)
Spinal Patient location during procedure: OR Start time: 01/01/2014 2:54 PM End time: 01/01/2014 3:00 PM Staffing Anesthesiologist: Nickie Retort Resident/CRNA: Darlys Gales R Performed by: resident/CRNA  Preanesthetic Checklist Completed: patient identified, site marked, surgical consent, pre-op evaluation, timeout performed, IV checked, risks and benefits discussed and monitors and equipment checked Spinal Block Patient position: sitting Prep: Betadine Patient monitoring: heart rate, cardiac monitor, continuous pulse ox and blood pressure Approach: midline Location: L3-4 Injection technique: single-shot Needle Needle type: Sprotte  Needle gauge: 24 G Needle length: 9 cm Needle insertion depth: 6 cm Assessment Sensory level: T6

## 2014-01-02 ENCOUNTER — Encounter (HOSPITAL_COMMUNITY): Payer: Self-pay | Admitting: Orthopedic Surgery

## 2014-01-02 LAB — CBC
HCT: 33.1 % — ABNORMAL LOW (ref 36.0–46.0)
Hemoglobin: 11.1 g/dL — ABNORMAL LOW (ref 12.0–15.0)
MCH: 31.8 pg (ref 26.0–34.0)
MCHC: 33.5 g/dL (ref 30.0–36.0)
MCV: 94.8 fL (ref 78.0–100.0)
Platelets: 207 10*3/uL (ref 150–400)
RBC: 3.49 MIL/uL — AB (ref 3.87–5.11)
RDW: 12.9 % (ref 11.5–15.5)
WBC: 10.1 10*3/uL (ref 4.0–10.5)

## 2014-01-02 LAB — BASIC METABOLIC PANEL
Anion gap: 5 (ref 5–15)
BUN: 11 mg/dL (ref 6–23)
CO2: 23 mmol/L (ref 19–32)
Calcium: 8.4 mg/dL (ref 8.4–10.5)
Chloride: 105 mEq/L (ref 96–112)
Creatinine, Ser: 0.67 mg/dL (ref 0.50–1.10)
GFR calc Af Amer: >90 mL/min (ref >90)
GFR calc non Af Amer: >90 mL/min (ref >90)
GLUCOSE: 150 mg/dL — AB (ref 70–99)
POTASSIUM: 4 mmol/L (ref 3.5–5.1)
Sodium: 133 mmol/L — ABNORMAL LOW (ref 135–145)

## 2014-01-02 MED ORDER — HYDROCODONE-ACETAMINOPHEN 7.5-325 MG PO TABS: 1.0000 | ORAL_TABLET | ORAL | Status: DC | PRN

## 2014-01-02 MED ORDER — ASPIRIN 325 MG PO TBEC: 325.0000 mg | DELAYED_RELEASE_TABLET | Freq: Two times a day (BID) | ORAL | Status: AC

## 2014-01-02 MED ORDER — METHOCARBAMOL 500 MG PO TABS: 500.0000 mg | ORAL_TABLET | Freq: Four times a day (QID) | ORAL | Status: DC | PRN

## 2014-01-02 MED ORDER — POLYETHYLENE GLYCOL 3350 17 G PO PACK: 17.0000 g | PACK | Freq: Two times a day (BID) | ORAL | Status: DC

## 2014-01-02 MED ORDER — FERROUS SULFATE 325 (65 FE) MG PO TABS: 325.0000 mg | ORAL_TABLET | Freq: Three times a day (TID) | ORAL | Status: DC

## 2014-01-02 MED ORDER — DSS 100 MG PO CAPS: 100.0000 mg | ORAL_CAPSULE | Freq: Two times a day (BID) | ORAL | Status: DC

## 2014-01-02 NOTE — Progress Notes (Signed)
Discharged from floor via w/c, belongings and family with pt. No changes in assessment. Brianna Shaw, [employer]

## 2014-01-02 NOTE — Evaluation (Signed)
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  All education was completed.  No further OT is needed at this time.    Follow Up Recommendations  No OT follow up    Equipment Recommendations  3 in 1 bedside comode    Recommendations for Other Services       Precautions / Restrictions Precautions Precautions: Fall Restrictions Weight Bearing Restrictions: No      Mobility Bed Mobility                  Transfers Overall transfer level: Modified independent Equipment used: Rolling walker (2 wheeled)                  Balance                                            ADL Overall ADL's : Needs assistance/impaired                                       General ADL Comments: Pt was on her way back from commode:  mod I ambulating.  She is waiting for 3:1 commode.  Pt needs min A for LB bathing/dressing.  Educated to work within pain free tolerance:  Pt states she has been dealing with pain for a year.   Discussed tub readiness.  She has a suction cup grab bar:  recommended she reapply it every time she uses it.  Demonstrated sidestepping into tub     Vision                     Perception     Praxis      Pertinent Vitals/Pain Pain Assessment: 0-10 Pain Score: 2  Pain Location: L hip Pain Descriptors / Indicators: Sore     Hand Dominance     Extremity/Trunk Assessment Upper Extremity Assessment Upper Extremity Assessment: Overall WFL for tasks assessed           Communication Communication Communication: No difficulties   Cognition Arousal/Alertness: Awake/alert Behavior During Therapy: WFL for tasks assessed/performed Overall Cognitive Status: Within Functional Limits for tasks assessed                      General Comments       Exercises       Shoulder Instructions      Home Living Family/patient expects to be discharged to:: Private residence Living Arrangements: Alone Available Help at Discharge: Family               Bathroom Shower/Tub: Tub/shower unit Shower/tub characteristics: Architectural technologist: Standard     Home Equipment: Building services engineer Comments: 3:1 will be delivered      Prior Functioning/Environment Level of Independence: Independent             OT Diagnosis: Acute pain   OT Problem List:     OT Treatment/Interventions:      OT Goals(Current goals can be found in the care plan section)    OT Frequency:  Barriers to D/C:            Co-evaluation              End of Session    Activity Tolerance: Patient tolerated treatment well Patient left: in chair;with call bell/phone within reach;with family/visitor present   Time: 1137-1144 OT Time Calculation (min): 7 min Charges:  OT General Charges $OT Visit: 1 Procedure OT Evaluation $Initial OT Evaluation Tier I: 1 Procedure G-Codes:    Aislin Onofre 17-Jan-2014, 11:54 AM  Lesle Chris, OTR/L [phone] 01-17-14

## 2014-01-02 NOTE — Care Management Note (Addendum)
    Page 1 of 2   01/02/2014     11:39:38 AM CARE MANAGEMENT NOTE 01/02/2014  Patient:  Brianna Shaw, Brianna Shaw   Account Number:  [id number]  Date Initiated:  01/02/2014  Documentation initiated by:  [facility]  Subjective/Objective Assessment:   XGX:IVHS TOTAL HIP ARTHROPLASTY ANTERIOR APPROACH (Left)     Action/Plan:   discharge planning   Anticipated DC Date:  01/02/2014   Anticipated DC Plan:  [place]  CM consult      [facility] Choice  HOME HEALTH   Choice offered to / List presented to:  [name]   DME arranged  3-N-1  [name]      DME agency  [place]  [place]   Status of service:  Completed, signed off Medicare Important Message given?   (If response is "NO", the following Medicare IM given date fields will be blank) Date Medicare IM given:   Medicare IM given by:   Date Additional Medicare IM given:   Additional Medicare IM given by:    Discharge Disposition:  [place]  Per UR Regulation:  Reviewed for med. necessity/level of care/duration of stay  If discussed at [place] of Stay Meetings, dates discussed:    Comments:  01/02/14 11:37 Pt has changed mind and will require a 3n1 and rolling walker.  CM called [facility] DME rep, Lecretia to please delivfer DMe prior to discharge today.  No other Cm needs were communicated.  [name], BSN, [name] [phone]. 01/02/14 07:30 CM met with pt in room to offer choice of home health agency.  Pt chooses [facility] to render HHPT. Address and contact information verified with pt.  No DME needed.  Referral called to [facility] rep, Tim.  No other Cm needs were communicated.  [name], BSN, [place].

## 2014-01-02 NOTE — Progress Notes (Signed)
     Subjective: 1 Day Post-Op Procedure(s) (LRB): LEFT TOTAL HIP ARTHROPLASTY ANTERIOR APPROACH (Left)   Patient reports pain as mild, pain controlled. No events throughout the night. Ready to be discharged home.  Objective:   VITALS:   Filed Vitals:   01/02/14 0628  BP: 110/66  Pulse: 87  Temp: 97.7 F (36.5 C)  Resp: 19    Dorsiflexion/Plantar flexion intact Incision: dressing C/D/I No cellulitis present Compartment soft  LABS  Recent Labs  01/02/14 0420  HGB 11.1*  HCT 33.1*  WBC 10.1  PLT 207     Recent Labs  01/02/14 0420  NA 133*  K 4.0  BUN 11  CREATININE 0.67  GLUCOSE 150*     Assessment/Plan: 1 Day Post-Op Procedure(s) (LRB): LEFT TOTAL HIP ARTHROPLASTY ANTERIOR APPROACH (Left) Foley cath d/c'ed Advance diet Up with therapy D/C IV fluids Discharge home with home health  Follow up in 2 weeks at [facility]. Follow up with OLIN,Jiya Kissinger D in 2 weeks.  Contact information:  [facility] [address], [address] [place] Yamilett Anastos   PAC  01/02/2014, 8:59 AM

## 2014-01-02 NOTE — Evaluation (Signed)
Physical Therapy Evaluation Patient Details Name: Brianna Shaw MRN: [id number] DOB: 06-14-1980 Today's Date: 01/02/2014   History of Present Illness  L DA THA  Clinical Impression  Pt s/p L THR presents with decreased L LE strength/ROM and post op pain limiting functional mobility.  Pt eager for d/c home this date and able to demonstrate ability to mobilize at supervision level including amb 400' with RW.    Follow Up Recommendations Home health PT    Equipment Recommendations       Recommendations for Other Services OT consult     Precautions / Restrictions Precautions Precautions: Fall Restrictions Weight Bearing Restrictions: No      Mobility  Bed Mobility Overal bed mobility: Modified Independent                Transfers Overall transfer level: Needs assistance Equipment used: Rolling walker (2 wheeled) Transfers: Sit to/from Stand Sit to Stand: Supervision         General transfer comment: cues for use of UEs to self assist and LE managemetn  Ambulation/Gait Ambulation/Gait assistance: Min guard;Supervision Ambulation Distance (Feet): 400 Feet Assistive device: Rolling walker (2 wheeled) Gait Pattern/deviations: Step-to pattern;Step-through pattern;Decreased step length - right;Decreased step length - left;Shuffle;Trunk flexed     General Gait Details: cues for posture, ER on L, position from RW and initial sequence  Stairs            Wheelchair Mobility    Modified Rankin (Stroke Patients Only)       Balance                                             Pertinent Vitals/Pain Pain Assessment: 0-10 Pain Score: 2  Pain Location: L hip Pain Descriptors / Indicators: Aching;Burning Pain Intervention(s): Limited activity within patient's tolerance;Monitored during session;Premedicated before session;Ice applied    Home Living Family/patient expects to be discharged to:: Private residence Living Arrangements:  Alone Available Help at Discharge: Family Type of Home: Apartment Home Access: Level entry     Home Layout: One level Home Equipment: Clinical cytogeneticist - 2 wheels Additional Comments: 3:1 will be delivered    Prior Function Level of Independence: Independent               Hand Dominance        Extremity/Trunk Assessment   Upper Extremity Assessment: Overall WFL for tasks assessed           Lower Extremity Assessment: LLE deficits/detail   LLE Deficits / Details: 3-/5 hip strength with AAROM at hip to 90 flex and 20 abd  Cervical / Trunk Assessment: Normal  Communication   Communication: No difficulties  Cognition Arousal/Alertness: Awake/alert Behavior During Therapy: WFL for tasks assessed/performed Overall Cognitive Status: Within Functional Limits for tasks assessed                      General Comments      Exercises Total Joint Exercises Ankle Circles/Pumps: AROM;Both;15 reps;Supine Quad Sets: AROM;Both;10 reps;Supine Heel Slides: AAROM;Left;15 reps;Supine Hip ABduction/ADduction: AAROM;10 reps;Supine;Left      Assessment/Plan    PT Assessment Patient needs continued PT services  PT Diagnosis Difficulty walking   PT Problem List Decreased strength;Decreased range of motion;Decreased activity tolerance;Decreased mobility;Decreased knowledge of use of DME;Pain  PT Treatment Interventions DME instruction;Gait training;Functional mobility training;Therapeutic activities;Therapeutic exercise;Patient/family education   PT  Goals (Current goals can be found in the Care Plan section) Acute Rehab PT Goals Patient Stated Goal: HOME PT Goal Formulation: All assessment and education complete, DC therapy Time For Goal Achievement: 01/09/14 Potential to Achieve Goals: Good    Frequency 7X/week   Barriers to discharge        Co-evaluation               End of Session   Activity Tolerance: Patient tolerated treatment well Patient  left: in chair;with call bell/phone within reach;with family/visitor present Nurse Communication: Mobility status         Time: 8372-9021 PT Time Calculation (min) (ACUTE ONLY): 28 min   Charges:   PT Evaluation $Initial PT Evaluation Tier I: 1 Procedure PT Treatments $Gait Training: 8-22 mins $Therapeutic Exercise: 8-22 mins   PT G Codes:          [place] Cost 01/02/2014, 4:39 PM

## 2014-01-02 NOTE — Plan of Care (Signed)
Problem: Discharge Progression Outcomes Goal: Anticoagulant follow-up in place Outcome: Not Applicable Date Met:  74/09/92 asa

## 2014-01-10 NOTE — Discharge Summary (Signed)
Physician Discharge Summary  Patient ID: Brianna Shaw MRN: [id number] DOB/AGE: 34/11/1980 34 y.o.  Admit date: 01/01/2014 Discharge date: 01/02/2014   Procedures:  Procedure(s) (LRB): LEFT TOTAL HIP ARTHROPLASTY ANTERIOR APPROACH (Left)  Attending Physician:  Dr. 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 Lyme disease 2009  . Colitis, ulcerative   . Personal history of colonic polyps     HPI: Brianna Shaw, 33 y.o. female, has a history of pain and functional disability in the left hip(s) due to arthritis and patient has failed non-surgical conservative treatments for greater than 12 weeks to include NSAID's and/or analgesics, supervised PT with diminished ADL's post treatment and activity modification. Onset of symptoms was abrupt starting in January 2015 with gradually worsening course since that time.The patient noted no past surgery on the left hip(s). Patient currently rates pain in the left hip at 8 out of 10 with activity. Patient has night pain, worsening of pain with activity and weight bearing, trendelenberg gait, pain that interfers with activities of daily living and pain with passive range of motion. Patient has evidence of periarticular osteophytes, joint space narrowing and severe AVN on MRI by imaging studies. This condition presents safety issues increasing the risk of falls. There is no current active infection. Risks, benefits and expectations were discussed with the patient. Risks including but not limited to the risk of anesthesia, blood clots, nerve damage, blood vessel damage, failure of the prosthesis, infection and up to and including death. Patient understand the risks, benefits and expectations and wishes to proceed with surgery.   PCP: [name], MD   Discharged Condition: good  Hospital Course:  Patient  underwent the above stated procedure on 01/01/2014. Patient tolerated the procedure well and brought to the recovery room in good condition and subsequently to the floor.  POD #1 BP: 110/66 ; Pulse: 87 ; Temp: 97.7 F (36.5 C) ; Resp: 19 Patient reports pain as mild, pain controlled. No events throughout the night. Ready to be discharged home. Dorsiflexion/plantar flexion intact, incision: dressing C/D/I, no cellulitis present and compartment soft.   LABS  Basename    HGB  11.1  HCT  33.1    Discharge Exam: General appearance: alert, cooperative and no distress Extremities: Homans sign is negative, no sign of DVT, no edema, redness or tenderness in the calves or thighs and no ulcers, gangrene or trophic changes  Disposition:   Home with follow up in 2 weeks   Follow-up Information    Follow up with Mauri Pole, MD. Schedule an appointment as soon as possible for a visit in 2 weeks.   Specialty:  Orthopedic Surgery   Contact information:   [address] [place] [address] [phone]       Follow up with [facility].   Why:  home health physical therapy   Contact information:   [place] [address] [phone]       Follow up with [facility].   Why:  rolling walker and 3n1   Contact information:   [place] [address] [phone]       Discharge Instructions    Call MD / Call 911    Complete by:  As directed   If you experience chest pain  or shortness of breath, CALL 911 and be transported to the hospital emergency room.  If you develope a fever above 101 F, pus (white drainage) or increased drainage or redness at the wound, or calf pain, call your surgeon's office.     Change dressing    Complete by:  As directed   Maintain surgical dressing until follow up in the clinic. If the edges start to pull up, may reinforce with tape. If the dressing is no longer working,  may remove and cover with gauze and tape, but must keep the area dry and clean.  Call with any questions or concerns.     Constipation Prevention    Complete by:  As directed   Drink plenty of fluids.  Prune juice may be helpful.  You may use a stool softener, such as Colace (over the counter) 100 mg twice a day.  Use MiraLax (over the counter) for constipation as needed.     Diet - low sodium heart healthy    Complete by:  As directed      Discharge instructions    Complete by:  As directed   Maintain surgical dressing until follow up in the clinic. If the edges start to pull up, may reinforce with tape. If the dressing is no longer working, may remove and cover with gauze and tape, but must keep the area dry and clean.  Follow up in 2 weeks at [facility]. Call with any questions or concerns.     Increase activity slowly as tolerated    Complete by:  As directed      TED hose    Complete by:  As directed   Use stockings (TED hose) for 2 weeks on both leg(s).  You may remove them at night for sleeping.     Weight bearing as tolerated    Complete by:  As directed   Laterality:  left  Extremity:  Lower             Medication List    STOP taking these medications        ibuprofen 200 MG tablet  Commonly known as:  ADVIL,MOTRIN     traMADol 50 MG tablet  Commonly known as:  ULTRAM      TAKE these medications        aspirin 325 MG EC tablet  Take 1 tablet (325 mg total) by mouth 2 (two) times daily.     CALCIUM CITRATE + PO  Take 1 tablet by mouth every morning.     DSS 100 MG Caps  Take 100 mg by mouth 2 (two) times daily.     ferrous sulfate 325 (65 FE) MG tablet  Take 1 tablet (325 mg total) by mouth 3 (three) times daily after meals.     HYDROcodone-acetaminophen 7.5-325 MG per tablet  Commonly known as:  NORCO  Take 1-2 tablets by mouth every 4 (four) hours as needed for moderate pain.     lidocaine 5 %  Commonly known as:  LIDODERM  Place 1 patch  onto the skin daily as needed (hip pain.). Remove & Discard patch within 12 hours or as directed by MD     medroxyPROGESTERone 150 MG/ML injection  Commonly known as:  DEPO-PROVERA  Inject 150 mg into the muscle every 3 (three) months.     mesalamine 1.2 G EC tablet  Commonly known as:  LIALDA  Take 2 tablets (2.4 g total) by mouth daily.  methocarbamol 500 MG tablet  Commonly known as:  ROBAXIN  Take 1 tablet (500 mg total) by mouth every 6 (six) hours as needed for muscle spasms.     polyethylene glycol packet  Commonly known as:  MIRALAX / GLYCOLAX  Take 17 g by mouth 2 (two) times daily.     polyvinyl alcohol 1.4 % ophthalmic solution  Commonly known as:  LIQUIFILM TEARS  Place 1-2 drops into both eyes daily as needed for dry eyes.     PROBIOTIC DAILY Caps  Take 1 capsule by mouth daily.         Signed: [place]. Dameisha Tschida   PA-C  01/10/2014, 5:50 PM

## 2014-02-21 ENCOUNTER — Telehealth: Payer: Self-pay | Admitting: Internal Medicine

## 2014-02-21 DIAGNOSIS — K5100 Ulcerative (chronic) pancolitis without complications: Secondary | ICD-10-CM

## 2014-02-21 MED ORDER — MESALAMINE 1.2 G PO TBEC: 1.2000 g | DELAYED_RELEASE_TABLET | Freq: Two times a day (BID) | ORAL | Status: DC

## 2014-02-21 NOTE — Telephone Encounter (Signed)
Spoke with mom Arbie Cookey , she said Brianna Shaw will get some [facility] assistance with her Lialda.  I will route to Dr. Carlean Purl to get him to confirm dosage to reorder.

## 2014-02-21 NOTE — Telephone Encounter (Signed)
1.2 g dose take 2 each day  May refill 1 month + 5 RF

## 2014-02-21 NOTE — Telephone Encounter (Signed)
Rx sent in as requested.

## 2014-02-22 ENCOUNTER — Telehealth: Payer: Self-pay | Admitting: Internal Medicine

## 2014-02-25 NOTE — Telephone Encounter (Signed)
Left message for patient to call back  

## 2014-02-26 NOTE — Telephone Encounter (Signed)
Patient's orthopedic surgeon wants her to see someone about mineral and vitamin deficiencies to prevent further fractures.  She recently had a hip replacement for avascular necrosis.  She will come in and see Dr. Carlean Purl to discuss.  Her orthopedic wants her to see a endocrinologist that specializes in bones.  She will discuss at the office visit on 03/20/13 2:15

## 2014-02-27 ENCOUNTER — Telehealth: Payer: Self-pay | Admitting: Internal Medicine

## 2014-02-27 DIAGNOSIS — K5100 Ulcerative (chronic) pancolitis without complications: Secondary | ICD-10-CM

## 2014-02-27 NOTE — Telephone Encounter (Signed)
Spoke with mom Arbie Cookey) , she is requesting that we re-send the rx for 1.2 Lialda 4 tabs qd instead of 2 qd.  Mom thought she was suppose to be on that dose originally and we got it down to 2 tabs qd b/c of cost.

## 2014-02-28 MED ORDER — MESALAMINE 1.2 G PO TBEC: 2.4000 g | DELAYED_RELEASE_TABLET | Freq: Two times a day (BID) | ORAL | Status: DC

## 2014-02-28 NOTE — Telephone Encounter (Signed)
Spoke with mom Arbie Cookey and told her that apparently Graceanna has talked to [facility] and has an appointment to come in and discuss medicines.  Arbie Cookey is requesting that we still send in the new Lialda rx because there is a cut off for her assistance and it's before her appointment.  Since Dr. Carlean Purl approved this I will send it and Evolette will keep appointment for early March.

## 2014-02-28 NOTE — Telephone Encounter (Signed)
Ok fine 

## 2014-03-21 ENCOUNTER — Ambulatory Visit: Payer: Self-pay | Admitting: Internal Medicine

## 2014-03-21 ENCOUNTER — Telehealth: Payer: Self-pay | Admitting: Internal Medicine

## 2014-03-21 NOTE — Telephone Encounter (Signed)
Dr. Carlean Purl,  Patient rescheduled her same day apt for 4/27.  She is waiting to have other testing done to confirm her vit def before she sees you.

## 2014-03-21 NOTE — Telephone Encounter (Signed)
No charge. 

## 2014-04-08 ENCOUNTER — Encounter: Payer: Self-pay | Admitting: Internal Medicine

## 2014-05-08 ENCOUNTER — Ambulatory Visit: Payer: Self-pay | Admitting: Internal Medicine

## 2014-06-17 ENCOUNTER — Telehealth: Payer: Self-pay | Admitting: Internal Medicine

## 2014-06-17 ENCOUNTER — Encounter: Payer: Self-pay | Admitting: Internal Medicine

## 2014-06-17 DIAGNOSIS — K5100 Ulcerative (chronic) pancolitis without complications: Secondary | ICD-10-CM

## 2014-06-17 MED ORDER — MESALAMINE 1.2 G PO TBEC: 2.4000 g | DELAYED_RELEASE_TABLET | Freq: Two times a day (BID) | ORAL | Status: DC

## 2014-06-17 NOTE — Telephone Encounter (Signed)
rx sent in as requested.

## 2014-08-28 ENCOUNTER — Encounter: Payer: Self-pay | Admitting: Physician Assistant

## 2014-08-28 ENCOUNTER — Ambulatory Visit (INDEPENDENT_AMBULATORY_CARE_PROVIDER_SITE_OTHER): Payer: 59 | Admitting: Physician Assistant

## 2014-08-28 VITALS — BP 104/68 | HR 72 | Temp 98.7°F | Resp 16 | Ht 65.0 in | Wt 123.0 lb

## 2014-08-28 DIAGNOSIS — M87052 Idiopathic aseptic necrosis of left femur: Secondary | ICD-10-CM | POA: Diagnosis not present

## 2014-08-28 DIAGNOSIS — G4726 Circadian rhythm sleep disorder, shift work type: Secondary | ICD-10-CM

## 2014-08-28 DIAGNOSIS — M87051 Idiopathic aseptic necrosis of right femur: Secondary | ICD-10-CM | POA: Insufficient documentation

## 2014-08-28 DIAGNOSIS — Z9660 Presence of unspecified orthopedic joint implant: Secondary | ICD-10-CM | POA: Diagnosis not present

## 2014-08-28 DIAGNOSIS — Z96649 Presence of unspecified artificial hip joint: Secondary | ICD-10-CM

## 2014-08-28 DIAGNOSIS — K51919 Ulcerative colitis, unspecified with unspecified complications: Secondary | ICD-10-CM

## 2014-08-28 MED ORDER — CLONAZEPAM 0.5 MG PO TABS: ORAL_TABLET | ORAL | Status: DC

## 2014-08-28 NOTE — Progress Notes (Signed)
Patient ID: Brianna Shaw MRN: [id number], DOB: 1980/11/16, 34 y.o. Date of Encounter: 08/28/2014, 10:22 AM    Chief Complaint:  Chief Complaint  Patient presents with  . Follow-up    need referral for hip surgery     HPI: 34 y.o. year old white female presents for above.  Her last office visit here was 03/30/2010. I reviewed some of her information in her paper chart.  She has a history of ulcerative colitis. She states that secondary to having to be on prednisone to treat this, she has developed avascular necrosis in the hip. Says that Dr. Alvan Dame did a left hip replacement 01/02/2015. Says that at the time of that evaluation, she also had had MRI of the right hip and he had told her to just follow-up whenever she started having pain in that hip and they would need to do right hip replacement as well. Says that she called his office to schedule follow-up and they said she needed referral from PCP so she presents for a visit today.  Says that she has been seeing gynecologist. Says they did a bone density scan that was normal so she knows that the hip problems are not secondary to osteoporosis but are secondary to prednisone and avascular necrosis.  She says that she also sees Dr. Carlean Purl for her ulcerative colitis. Is not sure whether she will need a referral in order to schedule follow-up visit with him so wants to go ahead and have referral placed to him just in case.  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 At her visit with me 03/30/2010 insomnia was one of the issues addressed at that visit. At that time, she said that she was unable to sleep during the day. Was feeling exhausted. At that visit I prescribed Lunesta. She says that that did not work for her. Says that the Klonopin did work well but she had not gotten any refills but would like to have that available to use on the days that she works.  Says that she currently is just working per  diem/part-time. Says that means she paid gets paid twice as much per hour when she does work but says that she does not have insurance coverage through the job with that. Says she also has no type of disability provided. Says that she tried going without any insurance for a while but then got Obama care plan. Says that she has been working more recently trying to save up money for her upcoming surgery.  Says that she grew up living across from Dr. 04/12/2010 parents. Says that she also grew up with Dr. Tawanna Sat daughters and has known them all of her life.     Home Meds:     Outpatient Prescriptions Prior to Visit  Medication Sig Dispense Refill  . Calcium Citrate-Vitamin D (CALCIUM CITRATE + PO) Take 1 tablet by mouth every morning.    . lidocaine (LIDODERM) 5 % Place 1 patch onto the skin daily as needed (hip pain.). Remove & Discard patch within 12 hours or as directed by MD    . mesalamine (LIALDA) 1.2 G EC tablet Take 2 tablets (2.4 g total) by mouth 2 (two) times daily. 120 tablet 5  . methocarbamol (ROBAXIN) 500 MG tablet Take 1 tablet (500 mg total) by mouth every 6 (six) hours as needed for muscle spasms. 50 tablet 0  .      .      .      .      .      Tawanna Sat  No facility-administered medications prior to visit.    Allergies:  Allergies  Allergen Reactions  . Flagyl [Metronidazole] Nausea And Vomiting    Po medication only      Review of Systems: See HPI for pertinent ROS. All other ROS negative.    Physical Exam: Blood pressure 104/68, pulse 72, temperature 98.7 F (37.1 C), temperature source Oral, resp. rate 16, height 5' 5"  (1.651 m), weight 123 lb (55.792 kg)., Body mass index is 20.47 kg/(m^2). General: Very pleasant, [name].  Appears in no acute distress. Neck: Supple. No thyromegaly. No lymphadenopathy. Lungs: Clear bilaterally to auscultation without wheezes, rales, or rhonchi. Breathing is unlabored. Heart: Regular rhythm. No murmurs, rubs,  or gallops. Msk:  Strength and tone normal for age. Extremities/Skin: Warm and dry.  Neuro: Alert and oriented X 3. Moves all extremities spontaneously. Gait is normal. CNII-XII grossly in tact. Psych:  Responds to questions appropriately with a normal affect.     ASSESSMENT AND PLAN:  34 y.o. year old female with   1. Avascular necrosis of bones of both hips - Ambulatory referral to Orthopedic Surgery Comment added to schedule this with Dr. Adriana Mccallum at [facility] as patient has seen him for this problem in the past and needs follow-up with him but insurance is requiring referral.  2. S/P left THA, AA - Ambulatory referral to Orthopedic Surgery  3. Ulcerative colitis, unspecified complication - Ambulatory referral to Gastroenterology Comment added to schedule this with Dr. Arelia Longest as patient has been seeing him for this problem in the past and needs follow-up with him but insurance requiring referral  4. Shift work sleep disorder - clonazePAM (KLONOPIN) 0.5 MG tablet; One once daily as needed for sleep/insomnia.  Dispense: 30 tablet; Refill: 1   Signed, [address] [place], [place], [facility] 08/28/2014 10:22 AM

## 2014-09-05 ENCOUNTER — Encounter: Payer: Self-pay | Admitting: Internal Medicine

## 2014-09-11 ENCOUNTER — Telehealth: Payer: Self-pay | Admitting: *Deleted

## 2014-09-11 NOTE — Telephone Encounter (Signed)
Submitted [employer] compass referral to [place] to Dr. Ellender Hose with referral number [id number]  Type of referral: Consult and treat  Number of visits:6  Start date: 09/10/14  End date: 03/12/15  Dx: L95.320-EBXI in right hip  Copy has been faxed to [facility] ortho attn Caren Griffins and was given verbally

## 2014-10-28 ENCOUNTER — Telehealth: Payer: Self-pay

## 2014-10-28 DIAGNOSIS — K5100 Ulcerative (chronic) pancolitis without complications: Secondary | ICD-10-CM

## 2014-10-28 NOTE — Telephone Encounter (Signed)
Called [employer] [phone] to do a prior authorization for Lialda 1.2GM tablets, Dx : K51.90.  Gave this information to [place], she said it is being sent for review and they will fax [place] an answer.

## 2014-10-31 ENCOUNTER — Ambulatory Visit (AMBULATORY_SURGERY_CENTER): Payer: Self-pay | Admitting: *Deleted

## 2014-10-31 ENCOUNTER — Telehealth: Payer: Self-pay | Admitting: Physician Assistant

## 2014-10-31 VITALS — Ht 66.0 in | Wt 123.0 lb

## 2014-10-31 DIAGNOSIS — K5180 Other ulcerative colitis without complications: Secondary | ICD-10-CM

## 2014-10-31 NOTE — Telephone Encounter (Signed)
Patient is calling regarding a referral to Dr Carlean Purl at [place]. Her insurance company requires her to have a referral. She states that she has been seeing him for over 5 yrs.  You may reach her @ [phone]

## 2014-10-31 NOTE — Telephone Encounter (Signed)
Received denial for Lialda.  Patient aware.  I will route to Dr Carlean Purl to see if he can write an appeal letter.  She said this is what she's taken for years and it works.  She does not want to change to one of the approved rx's of Apriso or Sulfasalazine.  Gave her 2 boxes of samples of Lialda

## 2014-10-31 NOTE — Progress Notes (Signed)
No egg or soy allergy. No anesthesia problems.  No home O2.  No diet meds.

## 2014-11-01 NOTE — Telephone Encounter (Signed)
lmtrc

## 2014-11-04 NOTE — Telephone Encounter (Signed)
Submitted referral thru pt insurance with referral number [id number]  Authorized number of visits:6  Start date: 11/04/14  End date: 05/05/15  Dx: K51.8  I faxed over the authorization to Dr. [name] at [facility] gastro

## 2014-11-04 NOTE — Telephone Encounter (Signed)
Called and left message for pt to return my call to inform of the submission

## 2014-11-05 NOTE — Telephone Encounter (Signed)
Referral has been submitted, faxed to Dr. Carlean Purl office.

## 2014-11-07 ENCOUNTER — Telehealth: Payer: Self-pay | Admitting: *Deleted

## 2014-11-07 NOTE — Telephone Encounter (Signed)
Submitted referral thru [facility] compass on 11/04/14 for Dr [name] with referral number [id number]  Type of referral: consult and treat  Number of visits:6  Start date: 11/04/14  End date:05/05/15  Dx: K51.80-other ulcerative colitis without complications  Copy has been faxed to Dr. Carlean Purl office.

## 2014-11-13 NOTE — Telephone Encounter (Signed)
I think she needs to try Apriso and then if fails we can appeal

## 2014-11-14 NOTE — Telephone Encounter (Signed)
Left message for patient to call me back. 

## 2014-11-14 NOTE — Telephone Encounter (Signed)
Dr Carlean Purl please advise the sig for Apriso so I may send it in once I hear from her.

## 2014-11-18 NOTE — Telephone Encounter (Signed)
4 capsules qd #120 11 RF

## 2014-11-21 ENCOUNTER — Ambulatory Visit (AMBULATORY_SURGERY_CENTER): Payer: 59 | Admitting: Internal Medicine

## 2014-11-21 ENCOUNTER — Encounter: Payer: Self-pay | Admitting: Internal Medicine

## 2014-11-21 VITALS — BP 118/73 | HR 80 | Temp 98.1°F | Resp 15 | Ht 66.0 in | Wt 123.0 lb

## 2014-11-21 DIAGNOSIS — K5180 Other ulcerative colitis without complications: Secondary | ICD-10-CM

## 2014-11-21 DIAGNOSIS — Z86010 Personal history of colonic polyps: Secondary | ICD-10-CM

## 2014-11-21 DIAGNOSIS — K5100 Ulcerative (chronic) pancolitis without complications: Secondary | ICD-10-CM

## 2014-11-21 DIAGNOSIS — K529 Noninfective gastroenteritis and colitis, unspecified: Secondary | ICD-10-CM | POA: Diagnosis not present

## 2014-11-21 MED ORDER — SODIUM CHLORIDE 0.9 % IV SOLN: 500.0000 mL | INTRAVENOUS | Status: DC

## 2014-11-21 MED ORDER — MESALAMINE 1.2 G PO TBEC: 2.4000 g | DELAYED_RELEASE_TABLET | Freq: Two times a day (BID) | ORAL | Status: DC

## 2014-11-21 NOTE — Patient Instructions (Addendum)
A few areas of inflammation - nothing bad. I took biopsies and will let you know results and recommendations.  We should be able to do some more samples of Lialda until January.  I appreciate the opportunity to care for you. Gatha Mayer, MD, FACG  YOU HAD AN ENDOSCOPIC PROCEDURE TODAY AT [facility]:   Refer to the procedure report that was given to you for any specific questions about what was found during the examination.  If the procedure report does not answer your questions, please call your gastroenterologist to clarify.  If you requested that your care partner not be given the details of your procedure findings, then the procedure report has been included in a sealed envelope for you to review at your convenience later.  YOU SHOULD EXPECT: Some feelings of bloating in the abdomen. Passage of more gas than usual.  Walking can help get rid of the air that was put into your GI tract during the procedure and reduce the bloating. If you had a lower endoscopy (such as a colonoscopy or flexible sigmoidoscopy) you may notice spotting of blood in your stool or on the toilet paper. If you underwent a bowel prep for your procedure, you may not have a normal bowel movement for a few days.  Please Note:  You might notice some irritation and congestion in your nose or some drainage.  This is from the oxygen used during your procedure.  There is no need for concern and it should clear up in a day or so.  SYMPTOMS TO REPORT IMMEDIATELY:   Following lower endoscopy (colonoscopy or flexible sigmoidoscopy):  Excessive amounts of blood in the stool  Significant tenderness or worsening of abdominal pains  Swelling of the abdomen that is new, acute  Fever of 100F or higher     For urgent or emergent issues, a gastroenterologist can be reached at any hour by calling [phone].   DIET: Your first meal following the procedure should be a small meal and then it is ok to  progress to your normal diet. Heavy or fried foods are harder to digest and may make you feel nauseous or bloated.  Likewise, meals heavy in dairy and vegetables can increase bloating.  Drink plenty of fluids but you should avoid alcoholic beverages for 24 hours.  ACTIVITY:  You should plan to take it easy for the rest of today and you should NOT DRIVE or use heavy machinery until tomorrow (because of the sedation medicines used during the test).    FOLLOW UP: Our staff will call the number listed on your records the next business day following your procedure to check on you and address any questions or concerns that you may have regarding the information given to you following your procedure. If we do not reach you, we will leave a message.  However, if you are feeling well and you are not experiencing any problems, there is no need to return our call.  We will assume that you have returned to your regular daily activities without incident.  If any biopsies were taken you will be contacted by phone or by letter within the next 1-3 weeks.  Please call us at [phone] if you have not heard about the biopsies in 3 weeks.    SIGNATURES/CONFIDENTIALITY: You and/or your care partner have signed paperwork which will be entered into your electronic medical record.  These signatures attest to the fact that that the information above on  your After Visit Summary has been reviewed and is understood.  Full responsibility of the confidentiality of this discharge information lies with you and/or your care-partner.

## 2014-11-21 NOTE — Telephone Encounter (Signed)
Patient gets new insurance in January 2017 and she thinks her new insurance will cover [place].  Therefore we are going to supply her with more samples to cover her until January.

## 2014-11-21 NOTE — Telephone Encounter (Signed)
Mom informed that samples are up front for pick up.

## 2014-11-21 NOTE — Progress Notes (Signed)
Called to room to assist during endoscopic procedure.  Patient ID and intended procedure confirmed with present staff. Received instructions for my participation in the procedure from the performing physician.  

## 2014-11-21 NOTE — Progress Notes (Signed)
Report to PACU, RN, vss, BBS= Clear.  

## 2014-11-21 NOTE — Op Note (Signed)
[place]  [address] [place], [address]   COLONOSCOPY PROCEDURE REPORT  PATIENT: Brianna Shaw, Brianna Shaw  MR#: [id number] BIRTHDATE: 05/17/1980 , 34  yrs. old GENDER: female ENDOSCOPIST: Gatha Mayer, MD, [facility] PROCEDURE DATE:  11/21/2014 PROCEDURE:   Colonoscopy, surveillance and Colonoscopy with biopsy First Screening Colonoscopy - Avg.  risk and is 50 yrs.  old or older - No.  Prior Negative Screening - Now for repeat screening. N/A  History of Adenoma - Now for follow-up colonoscopy & has been > or = to 3 yrs.  Yes hx of adenoma.  Has been 3 or more years since last colonoscopy.  Polyps removed today? No Recommend repeat exam, <10 yrs? Yes high risk other ASA CLASS:   Class II INDICATIONS:Inflammatory bowel disease of the intestine if more precise diagnosis or determination of the extent / severity of activity of disease will influence immediate / future management and PH Colon Adenoma. MEDICATIONS: Propofol 240 mg IV and Monitored anesthesia care  DESCRIPTION OF PROCEDURE:   After the risks benefits and alternatives of the procedure were thoroughly explained, informed consent was obtained.  The digital rectal exam revealed no abnormalities of the rectum.   The [id number]  endoscope was introduced through the anus and advanced to the cecum, which was identified by both the appendix and ileocecal valve. No adverse events experienced.   The quality of the prep was excellent. (MiraLax was used)  The instrument was then slowly withdrawn as the colon was fully examined. Estimated blood loss is zero unless otherwise noted in this procedure report.  COLON FINDINGS: 1) Mild inflammation/colitis at appendiceal area - biopsied 2) Group of pseudopolyps and inflammation proximal descednding colon - biopsied 3) Scattered pseudopolyps throughout colon 4) Scarring - mild - sigmoid mucosa 5) Otherwise normal and normal terminal ileum.  Retroflexed views revealed  no abnormalities. The time to cecum = 2.9 Withdrawal time = 8.4   The scope was withdrawn and the procedure completed. COMPLICATIONS: There were no immediate complications.  ENDOSCOPIC IMPRESSION: 1) Mild inflammation/colitis at appendiceal area - biopsied 2) Group of pseudopolyps and inflammation proximal descednding colon - biopsied 3) Scattered pseudopolyps throughout colon 4) Scarring - mild - sigmoid mucosa 5) Otherwise normal and normal terminal ileum  RECOMMENDATIONS: 1.  Await pathology results 2.  She will try to continue Lialda with samples until Jan insurance change. Disease not completely controlled presently though only small amount of mucosal abnormality seen - ? change/add agents or perhaps not been able to use 4.8 g mesalamine daily eSigned:  Gatha Mayer, MD, [facility] 11/21/2014 10:01 AM  cc: The Patient

## 2014-11-22 ENCOUNTER — Telehealth: Payer: Self-pay | Admitting: *Deleted

## 2014-11-22 NOTE — Telephone Encounter (Signed)
  Follow up Call-  Call back number 11/21/2014  Post procedure Call Back phone  # [phone] cell  Permission to leave phone message Yes     Patient questions:  Message left to call us if necessary.

## 2014-11-25 NOTE — H&P (Signed)
TOTAL HIP ADMISSION H&P  Patient is admitted for right total hip arthroplasty, anterior approach.  Subjective:  Chief Complaint:    Right hip AVN / pain  HPI: Brianna Shaw, 34 y.o. female, has a history of pain and functional disability in the right hip(s) due to arthritis and AVN and patient has failed non-surgical conservative treatments for greater than 12 weeks to include NSAID's and/or analgesics and activity modification.  Onset of symptoms was gradual starting in January / February 2016 with gradually worsening course since that time.The patient noted prior procedures of the hip to include arthroplasty on the left hip in December of 2015 per Dr. Alvan Dame.  Patient currently rates pain in the right hip at 7 out of 10 with activity. Patient has night pain, worsening of pain with activity and weight bearing, trendelenberg gait, pain that interfers with activities of daily living and pain with passive range of motion. Patient has evidence of periarticular osteophytes, joint space narrowing and AVN by imaging studies. This condition presents safety issues increasing the risk of falls.   There is no current active infection.  Risks, benefits and expectations were discussed with the patient.  Risks including but not limited to the risk of anesthesia, blood clots, nerve damage, blood vessel damage, failure of the prosthesis, infection and up to and including death.  Patient understand the risks, benefits and expectations and wishes to proceed with surgery.   PCP: Brianna Juba, PA-C  D/C Plans:      Home with HHPT  Post-op Meds:       No Rx given   Tranexamic Acid:      To be given - IV   Decadron:      Is to be given  FYI:     ASA post-op  Norco post-op  Morphine 1-2 mg IV  ? Sleep med - Ambien doesn't work  NO OT order    Patient Active Problem List   Diagnosis Date Noted  . Avascular necrosis of bones of both hips ([place]) 08/28/2014  . 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 S/P left  THA, AA 01/01/2014  . COLONIC POLYPS, ADENOMATOUS, HX OF 06/03/2009  . Ulcerative colitis ([place]) 06/02/2009   Past Medical History  Diagnosis Date  . Lyme disease 2008  . Colitis, ulcerative ([place])   . Personal history of colonic polyps   . Avascular necrosis ([place])   . Allergy   . Anemia     Past Surgical History  Procedure Laterality Date  . Colonoscopy  multiple  . Total hip arthroplasty Left 01/01/2014    Procedure: LEFT TOTAL HIP ARTHROPLASTY ANTERIOR APPROACH;  Surgeon: Mauri Pole, MD;  Location: [facility];  Service: Orthopedics;  Laterality: Left;  . Left hip replacement    . Wisdom teeth extrcaction      No prescriptions prior to admission   Allergies  Allergen Reactions  . Flagyl [Metronidazole] Nausea And Vomiting    Po medication only    Social History  Substance Use Topics  . Smoking status: Never Smoker   . Smokeless tobacco: Never Used  . Alcohol Use: 4.8 oz/week    8 Cans of beer per week    Family History  Problem Relation Age of Onset  . Breast cancer Mother   . Diabetes Maternal Grandmother   . Heart disease Maternal Grandfather   . Diabetes Paternal Grandfather   . Colon cancer Paternal Grandmother   . Esophageal cancer Neg Hx   . Rectal cancer Neg  Hx   . Stomach cancer Neg Hx      Review of Systems  Constitutional: Negative.   HENT: Negative.   Eyes: Negative.   Respiratory: Negative.   Cardiovascular: Negative.   Gastrointestinal: Negative.   Genitourinary: Negative.   Musculoskeletal: Positive for joint pain.  Skin: Negative.   Neurological: Negative.   Endo/Heme/Allergies: Positive for environmental allergies.  Psychiatric/Behavioral: Negative.     Objective:  Physical Exam  Constitutional: She is oriented to person, place, and time. She appears well-developed and well-nourished.  HENT:  Head: Normocephalic and atraumatic.  Eyes: Pupils are equal, round, and reactive to light.  Neck: Neck supple. No JVD present. No tracheal  deviation present. No thyromegaly present.  Cardiovascular: Normal rate, regular rhythm, normal heart sounds and intact distal pulses.   Respiratory: Effort normal and breath sounds normal. No stridor. No respiratory distress. She has no wheezes.  GI: Soft. There is no tenderness. There is no guarding.  Musculoskeletal:       Right hip: She exhibits decreased range of motion, decreased strength, tenderness and bony tenderness. She exhibits no swelling, no deformity and no laceration.  Lymphadenopathy:    She has no cervical adenopathy.  Neurological: She is alert and oriented to person, place, and time.  Skin: Skin is warm and dry.  Psychiatric: She has a normal mood and affect.      Labs:  Estimated body mass index is 20.47 kg/(m^2) as calculated from the following:   Height as of 08/28/14: 5' 5"  (1.651 m).   Weight as of 08/28/14: 55.792 kg (123 lb).   Imaging Review Plain radiographs demonstrate severe degenerative joint disease of the right hip(s). The bone quality appears to be good for age and reported activity level.  Assessment/Plan:  End stage arthritis, right hip(s)  The patient history, physical examination, clinical judgement of the provider and imaging studies are consistent with end stage degenerative joint disease of the right hip(s) and total hip arthroplasty is deemed medically necessary. The treatment options including medical management, injection therapy, arthroscopy and arthroplasty were discussed at length. The risks and benefits of total hip arthroplasty were presented and reviewed. The risks due to aseptic loosening, infection, stiffness, dislocation/subluxation,  thromboembolic complications and other imponderables were discussed.  The patient acknowledged the explanation, agreed to proceed with the plan and consent was signed. Patient is being admitted for inpatient treatment for surgery, pain control, PT, OT, prophylactic antibiotics, VTE prophylaxis, progressive  ambulation and ADL's and discharge planning.The patient is planning to be discharged home with home health services.     Brianna Pugh Aleza Pew   PA-C  11/25/2014, 3:33 PM

## 2014-11-26 ENCOUNTER — Encounter (HOSPITAL_COMMUNITY): Payer: Self-pay

## 2014-11-27 ENCOUNTER — Encounter (HOSPITAL_COMMUNITY): Payer: Self-pay

## 2014-11-27 ENCOUNTER — Encounter (HOSPITAL_COMMUNITY)
Admission: RE | Admit: 2014-11-27 | Discharge: 2014-11-27 | Disposition: A | Discharge status: Home / Self Care | Payer: 59 | Source: Ambulatory Visit | Attending: Orthopedic Surgery | Admitting: Orthopedic Surgery

## 2014-11-27 DIAGNOSIS — Z01812 Encounter for preprocedural laboratory examination: Secondary | ICD-10-CM | POA: Insufficient documentation

## 2014-11-27 HISTORY — DX: Sleep disorder, unspecified: G47.9

## 2014-11-27 HISTORY — DX: Anorexia: R63.0

## 2014-11-27 LAB — CBC
HCT: 37.3 % (ref 36.0–46.0)
HEMOGLOBIN: 12.3 g/dL (ref 12.0–15.0)
MCH: 31.7 pg (ref 26.0–34.0)
MCHC: 33 g/dL (ref 30.0–36.0)
MCV: 96.1 fL (ref 78.0–100.0)
Platelets: 212 10*3/uL (ref 150–400)
RBC: 3.88 MIL/uL (ref 3.87–5.11)
RDW: 13 % (ref 11.5–15.5)
WBC: 5.8 10*3/uL (ref 4.0–10.5)

## 2014-11-27 LAB — URINE MICROSCOPIC-ADD ON: RBC / HPF: NONE SEEN RBC/hpf (ref 0–5)

## 2014-11-27 LAB — URINALYSIS, ROUTINE W REFLEX MICROSCOPIC
Bilirubin Urine: NEGATIVE
Glucose, UA: NEGATIVE mg/dL
Hgb urine dipstick: NEGATIVE
KETONES UR: NEGATIVE mg/dL
NITRITE: NEGATIVE
PROTEIN: NEGATIVE mg/dL
Specific Gravity, Urine: 1.011 (ref 1.005–1.030)
pH: 7.5 (ref 5.0–8.0)

## 2014-11-27 LAB — SURGICAL PCR SCREEN
MRSA, PCR: NEGATIVE
STAPHYLOCOCCUS AUREUS: POSITIVE — AB

## 2014-11-27 LAB — BASIC METABOLIC PANEL
ANION GAP: 6 (ref 5–15)
BUN: 16 mg/dL (ref 6–20)
CALCIUM: 9.2 mg/dL (ref 8.9–10.3)
CO2: 28 mmol/L (ref 22–32)
Chloride: 104 mmol/L (ref 101–111)
Creatinine, Ser: 0.76 mg/dL (ref 0.44–1.00)
GFR calc Af Amer: >60 mL/min (ref >60)
GLUCOSE: 103 mg/dL — AB (ref 65–99)
Potassium: 3.4 mmol/L — ABNORMAL LOW (ref 3.5–5.1)
Sodium: 138 mmol/L (ref 135–145)

## 2014-11-27 LAB — APTT: APTT: 26 s (ref 24–37)

## 2014-11-27 LAB — PROTIME-INR
INR: 0.94 (ref 0.00–1.49)
PROTHROMBIN TIME: 12.8 s (ref 11.6–15.2)

## 2014-11-27 LAB — HCG, SERUM, QUALITATIVE: PREG SERUM: NEGATIVE

## 2014-11-27 NOTE — Patient Instructions (Signed)
JUNETTA HEARN  11/27/2014   Your procedure is scheduled on: Tuesday December 10, 2014   Report to [facility] Main  Entrance take [place]  elevators to 3rd floor to  [place] at 5:00 AM.  Call this number if you have problems the morning of surgery [phone]   Remember: ONLY 1 PERSON MAY GO WITH YOU TO SHORT STAY TO GET  READY MORNING OF [place].  Do not eat food or drink liquids :After Midnight.     Take these medicines the morning of surgery with A SIP OF WATER: NONE                                You may not have any metal on your body including hair pins and              piercings  Do not wear jewelry, make-up, lotions, powders or perfumes, deodorant             Do not wear nail polish.  Do not shave  48 hours prior to surgery.            Do not bring valuables to the hospital. [place].  Contacts, dentures or bridgework may not be worn into surgery.  Leave suitcase in the car. After surgery it may be brought to your room.                Please read over the following fact sheets you were given:MRSA INFORMATION SHEET; INCENTIVE SPIROMETER; BLOOD TRANSFUSION INFORMATION SHEET _____________________________________________________________________             [facility] - Preparing for Surgery Before surgery, you can play an important role.  Because skin is not sterile, your skin needs to be as free of germs as possible.  You can reduce the number of germs on your skin by washing with CHG (chlorahexidine gluconate) soap before surgery.  CHG is an antiseptic cleaner which kills germs and bonds with the skin to continue killing germs even after washing. Please DO NOT use if you have an allergy to CHG or antibacterial soaps.  If your skin becomes reddened/irritated stop using the CHG and inform your nurse when you arrive at Short Stay. Do not shave (including legs and underarms) for at least 48  hours prior to the first CHG shower.  You may shave your face/neck. Please follow these instructions carefully:  1.  Shower with CHG Soap the night before surgery and the  morning of Surgery.  2.  If you choose to wash your hair, wash your hair first as usual with your  normal  shampoo.  3.  After you shampoo, rinse your hair and body thoroughly to remove the  shampoo.                           4.  Use CHG as you would any other liquid soap.  You can apply chg directly  to the skin and wash                       Gently with a scrungie or clean washcloth.  5.  Apply the CHG Soap  to your body ONLY FROM THE NECK DOWN.   Do not use on face/ open                           Wound or open sores. Avoid contact with eyes, ears mouth and genitals (private parts).                       Wash face,  Genitals (private parts) with your normal soap.             6.  Wash thoroughly, paying special attention to the area where your surgery  will be performed.  7.  Thoroughly rinse your body with warm water from the neck down.  8.  DO NOT shower/wash with your normal soap after using and rinsing off  the CHG Soap.                9.  Pat yourself dry with a clean towel.            10.  Wear clean pajamas.            11.  Place clean sheets on your bed the night of your first shower and do not  sleep with pets. Day of Surgery : Do not apply any lotions/deodorants the morning of surgery.  Please wear clean clothes to the hospital/surgery center.  FAILURE TO FOLLOW THESE INSTRUCTIONS MAY RESULT IN THE CANCELLATION OF YOUR SURGERY PATIENT SIGNATURE_________________________________  NURSE SIGNATURE__________________________________  ________________________________________________________________________   Adam Phenix  An incentive spirometer is a tool that can help keep your lungs clear and active. This tool measures how well you are filling your lungs with each breath. Taking long deep breaths may help  reverse or decrease the chance of developing breathing (pulmonary) problems (especially infection) following:  A long period of time when you are unable to move or be active. BEFORE THE PROCEDURE   If the spirometer includes an indicator to show your best effort, your nurse or respiratory therapist will set it to a desired goal.  If possible, sit up straight or lean slightly forward. Try not to slouch.  Hold the incentive spirometer in an upright position. INSTRUCTIONS FOR USE   Sit on the edge of your bed if possible, or sit up as far as you can in bed or on a chair.  Hold the incentive spirometer in an upright position.  Breathe out normally.  Place the mouthpiece in your mouth and seal your lips tightly around it.  Breathe in slowly and as deeply as possible, raising the piston or the ball toward the top of the column.  Hold your breath for 3-5 seconds or for as long as possible. Allow the piston or ball to fall to the bottom of the column.  Remove the mouthpiece from your mouth and breathe out normally.  Rest for a few seconds and repeat Steps 1 through 7 at least 10 times every 1-2 hours when you are awake. Take your time and take a few normal breaths between deep breaths.  The spirometer may include an indicator to show your best effort. Use the indicator as a goal to work toward during each repetition.  After each set of 10 deep breaths, practice coughing to be sure your lungs are clear. If you have an incision (the cut made at the time of surgery), support your incision when coughing by placing a pillow or rolled up towels firmly  against it. Once you are able to get out of bed, walk around indoors and cough well. You may stop using the incentive spirometer when instructed by your caregiver.  RISKS AND COMPLICATIONS  Take your time so you do not get dizzy or light-headed.  If you are in pain, you may need to take or ask for pain medication before doing incentive spirometry.  It is harder to take a deep breath if you are having pain. AFTER USE  Rest and breathe slowly and easily.  It can be helpful to keep track of a log of your progress. Your caregiver can provide you with a simple table to help with this. If you are using the spirometer at home, follow these instructions: [place] IF:   You are having difficultly using the spirometer.  You have trouble using the spirometer as often as instructed.  Your pain medication is not giving enough relief while using the spirometer.  You develop fever of 100.5 F (38.1 C) or higher. SEEK IMMEDIATE MEDICAL CARE IF:   You cough up bloody sputum that had not been present before.  You develop fever of 102 F (38.9 C) or greater.  You develop worsening pain at or near the incision site. MAKE SURE YOU:   Understand these instructions.  Will watch your condition.  Will get help right away if you are not doing well or get worse. Document Released: 05/10/2006 Document Revised: 03/22/2011 Document Reviewed: 07/11/2006 Exit[employer] Patient Information 2014 Exit[employer], [place].   ________________________________________________________________________  WHAT IS A BLOOD TRANSFUSION? Blood Transfusion Information  A transfusion is the replacement of blood or some of its parts. Blood is made up of multiple cells which provide different functions.  Red blood cells carry oxygen and are used for blood loss replacement.  White blood cells fight against infection.  Platelets control bleeding.  Plasma helps clot blood.  Other blood products are available for specialized needs, such as hemophilia or other clotting disorders. BEFORE THE TRANSFUSION  Who gives blood for transfusions?   Healthy volunteers who are fully evaluated to make sure their blood is safe. This is blood bank blood. Transfusion therapy is the safest it has ever been in the practice of medicine. Before blood is taken from a donor, a complete  history is taken to make sure that person has no history of diseases nor engages in risky social behavior (examples are intravenous drug use or sexual activity with multiple partners). The donor's travel history is screened to minimize risk of transmitting infections, such as malaria. The donated blood is tested for signs of infectious diseases, such as HIV and hepatitis. The blood is then tested to be sure it is compatible with you in order to minimize the chance of a transfusion reaction. If you or a relative donates blood, this is often done in anticipation of surgery and is not appropriate for emergency situations. It takes many days to process the donated blood. RISKS AND COMPLICATIONS Although transfusion therapy is very safe and saves many lives, the main dangers of transfusion include:   Getting an infectious disease.  Developing a transfusion reaction. This is an allergic reaction to something in the blood you were given. Every precaution is taken to prevent this. The decision to have a blood transfusion has been considered carefully by your caregiver before blood is given. Blood is not given unless the benefits outweigh the risks. AFTER THE TRANSFUSION  Right after receiving a blood transfusion, you will usually feel much better and  more energetic. This is especially true if your red blood cells have gotten low (anemic). The transfusion raises the level of the red blood cells which carry oxygen, and this usually causes an energy increase.  The nurse administering the transfusion will monitor you carefully for complications. HOME CARE INSTRUCTIONS  No special instructions are needed after a transfusion. You may find your energy is better. Speak with your caregiver about any limitations on activity for underlying diseases you may have. SEEK MEDICAL CARE IF:   Your condition is not improving after your transfusion.  You develop redness or irritation at the intravenous (IV) site. SEEK  IMMEDIATE MEDICAL CARE IF:  Any of the following symptoms occur over the next 12 hours:  Shaking chills.  You have a temperature by mouth above 102 F (38.9 C), not controlled by medicine.  Chest, back, or muscle pain.  People around you feel you are not acting correctly or are confused.  Shortness of breath or difficulty breathing.  Dizziness and fainting.  You get a rash or develop hives.  You have a decrease in urine output.  Your urine turns a dark color or changes to pink, red, or brown. Any of the following symptoms occur over the next 10 days:  You have a temperature by mouth above 102 F (38.9 C), not controlled by medicine.  Shortness of breath.  Weakness after normal activity.  The white part of the eye turns yellow (jaundice).  You have a decrease in the amount of urine or are urinating less often.  Your urine turns a dark color or changes to pink, red, or brown. Document Released: 12/26/1999 Document Revised: 03/22/2011 Document Reviewed: 08/14/2007 [facility] Patient Information 2014 [place], [place].  _______________________________________________________________________

## 2014-11-27 NOTE — Progress Notes (Signed)
Urinalysis and micro results per epic per PAT visit 11/27/2014 sent to Dr Alvan Dame

## 2014-11-27 NOTE — Progress Notes (Addendum)
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 Results sent to Dr Alvan Dame. Prescription for Mupriocin Ointment called to [facility]. Spoke with [facility] - pharmacist. Pt is aware.

## 2014-12-03 ENCOUNTER — Encounter: Payer: Self-pay | Admitting: Internal Medicine

## 2014-12-03 DIAGNOSIS — K5100 Ulcerative (chronic) pancolitis without complications: Secondary | ICD-10-CM

## 2014-12-03 NOTE — Progress Notes (Signed)
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

## 2014-12-09 NOTE — Anesthesia Preprocedure Evaluation (Addendum)
Anesthesia Evaluation  Patient identified by MRN, date of birth, ID band Patient awake    Reviewed: Allergy & Precautions, H&P , NPO status , Patient's Chart, lab work & pertinent test results  Airway Mallampati: II  TM Distance: >3 FB Neck ROM: Full    Dental  (+) Dental Advisory Given, Chipped Right front chipped:   Pulmonary neg pulmonary ROS,    Pulmonary exam normal breath sounds clear to auscultation       Cardiovascular negative cardio ROS Normal cardiovascular exam Rhythm:Regular Rate:Normal     Neuro/Psych negative neurological ROS  negative psych ROS   GI/Hepatic Neg liver ROS, PUD,   Endo/Other  negative endocrine ROS  Renal/GU negative Renal ROS     Musculoskeletal negative musculoskeletal ROS (+)   Abdominal   Peds  Hematology negative hematology ROS (+)   Anesthesia Other Findings   Reproductive/Obstetrics negative OB ROS                            Anesthesia Physical Anesthesia Plan  ASA: II  Anesthesia Plan: Spinal   Post-op Pain Management:    Induction:   Airway Management Planned:   Additional Equipment:   Intra-op Plan:   Post-operative Plan:   Informed Consent:   Plan Discussed with: Surgeon  Anesthesia Plan Comments:         Anesthesia Quick Evaluation

## 2014-12-10 ENCOUNTER — Ambulatory Visit (HOSPITAL_COMMUNITY): Payer: 59 | Admitting: Anesthesiology

## 2014-12-10 ENCOUNTER — Ambulatory Visit (HOSPITAL_COMMUNITY): Payer: 59

## 2014-12-10 ENCOUNTER — Encounter (HOSPITAL_COMMUNITY): Payer: Self-pay

## 2014-12-10 ENCOUNTER — Encounter (HOSPITAL_COMMUNITY)
Admission: RE | Disposition: A | Discharge status: Home / Self Care | Payer: Self-pay | Source: Ambulatory Visit | Attending: Orthopedic Surgery

## 2014-12-10 ENCOUNTER — Ambulatory Visit (HOSPITAL_COMMUNITY)
Admission: RE | Admit: 2014-12-10 | Discharge: 2014-12-10 | Disposition: A | Discharge status: Home / Self Care | Payer: 59 | Source: Ambulatory Visit | Attending: Orthopedic Surgery | Admitting: Orthopedic Surgery

## 2014-12-10 DIAGNOSIS — K279 Peptic ulcer, site unspecified, unspecified as acute or chronic, without hemorrhage or perforation: Secondary | ICD-10-CM | POA: Diagnosis not present

## 2014-12-10 DIAGNOSIS — M87851 Other osteonecrosis, right femur: Secondary | ICD-10-CM | POA: Insufficient documentation

## 2014-12-10 DIAGNOSIS — K5190 Ulcerative colitis, unspecified, without complications: Secondary | ICD-10-CM | POA: Insufficient documentation

## 2014-12-10 DIAGNOSIS — Z86010 Personal history of colonic polyps: Secondary | ICD-10-CM | POA: Insufficient documentation

## 2014-12-10 DIAGNOSIS — Z96649 Presence of unspecified artificial hip joint: Secondary | ICD-10-CM

## 2014-12-10 DIAGNOSIS — Z7982 Long term (current) use of aspirin: Secondary | ICD-10-CM | POA: Insufficient documentation

## 2014-12-10 DIAGNOSIS — M1611 Unilateral primary osteoarthritis, right hip: Secondary | ICD-10-CM | POA: Insufficient documentation

## 2014-12-10 DIAGNOSIS — G479 Sleep disorder, unspecified: Secondary | ICD-10-CM | POA: Insufficient documentation

## 2014-12-10 DIAGNOSIS — Z888 Allergy status to other drugs, medicaments and biological substances status: Secondary | ICD-10-CM | POA: Insufficient documentation

## 2014-12-10 HISTORY — PX: TOTAL HIP ARTHROPLASTY: SHX124

## 2014-12-10 LAB — TYPE AND SCREEN
ABO/RH(D): B POS
Antibody Screen: NEGATIVE

## 2014-12-10 SURGERY — ARTHROPLASTY, HIP, TOTAL, ANTERIOR APPROACH
Anesthesia: Spinal | Site: Hip | Laterality: Right

## 2014-12-10 MED ORDER — DEXAMETHASONE SODIUM PHOSPHATE 10 MG/ML IJ SOLN: 10.0000 mg | Freq: Once | INTRAMUSCULAR | Status: DC

## 2014-12-10 MED ORDER — METHOCARBAMOL 500 MG PO TABS
500.0000 mg | ORAL_TABLET | Freq: Four times a day (QID) | ORAL | Status: DC | PRN
  Administered 2014-12-10: 500 mg via ORAL
  Filled 2014-12-10 (×2): qty 1

## 2014-12-10 MED ORDER — TRANEXAMIC ACID 1000 MG/10ML IV SOLN
1000.0000 mg | Freq: Once | INTRAVENOUS | Status: AC
  Administered 2014-12-10: 08:00:00 via INTRAVENOUS
  Administered 2014-12-10: 1000 mg via INTRAVENOUS
  Filled 2014-12-10: qty 10

## 2014-12-10 MED ORDER — LACTATED RINGERS IV SOLN: INTRAVENOUS | Status: DC

## 2014-12-10 MED ORDER — CEFAZOLIN SODIUM-DEXTROSE 2-3 GM-% IV SOLR
2.0000 g | INTRAVENOUS | Status: AC
  Administered 2014-12-10: 2 g via INTRAVENOUS

## 2014-12-10 MED ORDER — LACTATED RINGERS IV SOLN
INTRAVENOUS | Status: DC | PRN
  Administered 2014-12-10 (×3): via INTRAVENOUS

## 2014-12-10 MED ORDER — PHENYLEPHRINE HCL 10 MG/ML IJ SOLN
INTRAMUSCULAR | Status: DC | PRN
  Administered 2014-12-10 (×3): 20 ug via INTRAVENOUS

## 2014-12-10 MED ORDER — METHOCARBAMOL 500 MG PO TABS: 500.0000 mg | ORAL_TABLET | Freq: Four times a day (QID) | ORAL | Status: DC | PRN

## 2014-12-10 MED ORDER — HYDROCODONE-ACETAMINOPHEN 7.5-325 MG PO TABS
1.0000 | ORAL_TABLET | ORAL | Status: DC | PRN
  Administered 2014-12-10 (×3): 1 via ORAL
  Filled 2014-12-10 (×4): qty 1

## 2014-12-10 MED ORDER — MIDAZOLAM HCL 5 MG/5ML IJ SOLN
INTRAMUSCULAR | Status: DC | PRN
  Administered 2014-12-10 (×2): 1 mg via INTRAVENOUS

## 2014-12-10 MED ORDER — FENTANYL CITRATE (PF) 100 MCG/2ML IJ SOLN
INTRAMUSCULAR | Status: DC | PRN
  Administered 2014-12-10 (×2): 50 ug via INTRAVENOUS

## 2014-12-10 MED ORDER — BUPIVACAINE IN DEXTROSE 0.75-8.25 % IT SOLN
INTRATHECAL | Status: DC | PRN
  Administered 2014-12-10: 2 mL via INTRATHECAL

## 2014-12-10 MED ORDER — HYDROMORPHONE HCL 2 MG PO TABS: 2.0000 mg | ORAL_TABLET | Freq: Two times a day (BID) | ORAL | Status: DC | PRN

## 2014-12-10 MED ORDER — PROPOFOL 10 MG/ML IV BOLUS
INTRAVENOUS | Status: AC
  Filled 2014-12-10: qty 40

## 2014-12-10 MED ORDER — MIDAZOLAM HCL 2 MG/2ML IJ SOLN
INTRAMUSCULAR | Status: AC
  Filled 2014-12-10: qty 2

## 2014-12-10 MED ORDER — FENTANYL CITRATE (PF) 100 MCG/2ML IJ SOLN
INTRAMUSCULAR | Status: AC
  Filled 2014-12-10: qty 2

## 2014-12-10 MED ORDER — CHLORHEXIDINE GLUCONATE 4 % EX LIQD: 60.0000 mL | Freq: Once | CUTANEOUS | Status: DC

## 2014-12-10 MED ORDER — HYDROCODONE-ACETAMINOPHEN 7.5-325 MG PO TABS: 1.0000 | ORAL_TABLET | ORAL | Status: DC | PRN

## 2014-12-10 MED ORDER — CEPHALEXIN 500 MG PO CAPS: 500.0000 mg | ORAL_CAPSULE | Freq: Three times a day (TID) | ORAL | Status: DC

## 2014-12-10 MED ORDER — HYDROMORPHONE HCL 1 MG/ML IJ SOLN: 0.2500 mg | INTRAMUSCULAR | Status: DC | PRN

## 2014-12-10 MED ORDER — ASPIRIN EC 325 MG PO TBEC: 325.0000 mg | DELAYED_RELEASE_TABLET | Freq: Two times a day (BID) | ORAL | Status: AC

## 2014-12-10 MED ORDER — ONDANSETRON HCL 4 MG/2ML IJ SOLN
INTRAMUSCULAR | Status: DC | PRN
  Administered 2014-12-10: 4 mg via INTRAVENOUS

## 2014-12-10 MED ORDER — DEXAMETHASONE SODIUM PHOSPHATE 4 MG/ML IJ SOLN
INTRAMUSCULAR | Status: DC | PRN
  Administered 2014-12-10: 10 mg via INTRAVENOUS

## 2014-12-10 MED ORDER — PROPOFOL 500 MG/50ML IV EMUL
INTRAVENOUS | Status: DC | PRN
  Administered 2014-12-10: 140 ug/kg/min via INTRAVENOUS

## 2014-12-10 MED ORDER — CEFAZOLIN SODIUM-DEXTROSE 2-3 GM-% IV SOLR
INTRAVENOUS | Status: AC
  Filled 2014-12-10: qty 50

## 2014-12-10 SURGICAL SUPPLY — 34 items
BAG DECANTER FOR FLEXI CONT (MISCELLANEOUS) IMPLANT
BAG SPEC THK2 15X12 ZIP CLS (MISCELLANEOUS)
BAG ZIPLOCK 12X15 (MISCELLANEOUS) IMPLANT
CAPT HIP TOTAL 3 ×1 IMPLANT
CLOTH BEACON ORANGE TIMEOUT ST (SAFETY) ×2 IMPLANT
COVER PERINEAL POST (MISCELLANEOUS) ×2 IMPLANT
DRAPE STERI IOBAN 125X83 (DRAPES) ×2 IMPLANT
DRAPE U-SHAPE 47X51 STRL (DRAPES) ×4 IMPLANT
DRSG AQUACEL AG ADV 3.5X10 (GAUZE/BANDAGES/DRESSINGS) ×2 IMPLANT
DURAPREP 26ML APPLICATOR (WOUND CARE) ×2 IMPLANT
ELECT REM PT RETURN 15FT ADLT (MISCELLANEOUS) IMPLANT
ELECT REM PT RETURN 9FT ADLT (ELECTROSURGICAL) ×2
ELECTRODE REM PT RTRN 9FT ADLT (ELECTROSURGICAL) ×1 IMPLANT
GLOVE BIOGEL M 7.0 STRL (GLOVE) IMPLANT
GLOVE BIOGEL PI IND STRL 7.5 (GLOVE) ×1 IMPLANT
GLOVE BIOGEL PI IND STRL 8.5 (GLOVE) ×1 IMPLANT
GLOVE BIOGEL PI INDICATOR 7.5 (GLOVE) ×1
GLOVE BIOGEL PI INDICATOR 8.5 (GLOVE) ×1
GLOVE ECLIPSE 8.0 STRL XLNG CF (GLOVE) ×4 IMPLANT
GLOVE ORTHO TXT STRL SZ7.5 (GLOVE) ×2 IMPLANT
GOWN STRL REUS W/TWL LRG LVL3 (GOWN DISPOSABLE) ×2 IMPLANT
GOWN STRL REUS W/TWL XL LVL3 (GOWN DISPOSABLE) ×2 IMPLANT
HOLDER FOLEY CATH W/STRAP (MISCELLANEOUS) ×2 IMPLANT
LIQUID BAND (GAUZE/BANDAGES/DRESSINGS) ×2 IMPLANT
PACK ANTERIOR HIP CUSTOM (KITS) ×2 IMPLANT
SAW OSC TIP CART 19.5X105X1.3 (SAW) ×2 IMPLANT
SUT MNCRL AB 4-0 PS2 18 (SUTURE) ×2 IMPLANT
SUT VIC AB 1 CT1 36 (SUTURE) ×6 IMPLANT
SUT VIC AB 2-0 CT1 27 (SUTURE) ×4
SUT VIC AB 2-0 CT1 TAPERPNT 27 (SUTURE) ×2 IMPLANT
SUT VLOC 180 0 24IN GS25 (SUTURE) ×2 IMPLANT
TRAY FOLEY W/METER SILVER 14FR (SET/KITS/TRAYS/PACK) ×1 IMPLANT
TRAY FOLEY W/METER SILVER 16FR (SET/KITS/TRAYS/PACK) IMPLANT
WATER STERILE IRR 1500ML POUR (IV SOLUTION) ×2 IMPLANT

## 2014-12-10 NOTE — Progress Notes (Addendum)
Patient has returned to [place] to go home today after right total hip surgery anterior approach. Spinal is wearing off. Patient now has numbness and tingling in feet. Foley catheter has been discontinued. Paged physical therapy who will come down around 1330 to work with patient. Patient lives on second floor of apt building which has elevator. She has walker and 3 in 1 bedside commode at home. Patient is motivated to go home today and does not have any questions at present.  1430  Patient up to recliner chair after PT had patient up in hallway.  1600  Clarified with Dr Alvan Dame that patient will not be receiving PT at home. No PT is to be ordered for home.  1615  Patient ambulated in hallway and tolerated well. Pain under control. Discharge instructions gone over. Ready for discharge.

## 2014-12-10 NOTE — Anesthesia Postprocedure Evaluation (Signed)
Anesthesia Post Note  Patient: Brianna Shaw  Procedure(s) Performed: Procedure(s) (LRB): TOTAL RIGHT HIP ARTHROPLASTY ANTERIOR APPROACH (Right)  Patient location during evaluation: PACU Anesthesia Type: Spinal Level of consciousness: awake and alert Pain management: pain level controlled Vital Signs Assessment: post-procedure vital signs reviewed and stable Respiratory status: spontaneous breathing, nonlabored ventilation, respiratory function stable and patient connected to nasal cannula oxygen Cardiovascular status: blood pressure returned to baseline and stable Postop Assessment: no signs of nausea or vomiting Anesthetic complications: no    Last Vitals:  Filed Vitals:   12/10/14 0909 12/10/14 1015  BP: 96/70   Pulse: 55   Temp: 36.4 C 36.3 C  Resp: 14     Last Pain:  Filed Vitals:   12/10/14 1031  PainSc: 0-No pain    LLE Motor Response: Purposeful movement LLE Sensation: Decreased RLE Motor Response: Purposeful movement RLE Sensation: Decreased L Sensory Level: L3-Anterior knee, lower leg R Sensory Level: L3-Anterior knee, lower leg  Zai Chmiel L

## 2014-12-10 NOTE — Interval H&P Note (Signed)
History and Physical Interval Note:  12/10/2014 7:07 AM  Brianna Shaw  has presented today for surgery, with the diagnosis of RIGHT HIP AVASCULAR NECROSIS   The various methods of treatment have been discussed with the patient and family. After consideration of risks, benefits and other options for treatment, the patient has consented to  Procedure(s): TOTAL RIGHT HIP ARTHROPLASTY ANTERIOR APPROACH (Right) as a surgical intervention .  The patient's history has been reviewed, patient examined, no change in status, stable for surgery.  I have reviewed the patient's chart and labs.  Questions were answered to the patient's satisfaction.     Mauri Pole

## 2014-12-10 NOTE — Anesthesia Procedure Notes (Signed)
Spinal Patient location during procedure: OR Start time: 12/10/2014 7:16 AM End time: 12/10/2014 7:22 AM Staffing Resident/CRNA: Dion Saucier E Performed by: resident/CRNA  Preanesthetic Checklist Completed: patient identified, site marked, surgical consent, pre-op evaluation, timeout performed, IV checked, risks and benefits discussed and monitors and equipment checked Spinal Block Patient position: sitting Prep: Betadine, site prepped and draped and alcohol swabs Patient monitoring: heart rate, continuous pulse ox and blood pressure Approach: midline Location: L2-3 Injection technique: single-shot Needle Needle type: Sprotte and Introducer  Needle gauge: 24 G Needle length: 9 cm Additional Notes Kit expiration date checked. negative heme, paresthesias, tolerated well

## 2014-12-10 NOTE — Transfer of Care (Signed)
Immediate Anesthesia Transfer of Care Note  Patient: Brianna Shaw  Procedure(s) Performed: Procedure(s): TOTAL RIGHT HIP ARTHROPLASTY ANTERIOR APPROACH (Right)  Patient Location: PACU  Anesthesia Type:Spinal  Level of Consciousness: awake, alert , oriented and patient cooperative  Airway & Oxygen Therapy: Patient Spontanous Breathing and Patient connected to face mask oxygen  Post-op Assessment: Report given to RN, Post -op Vital signs reviewed and stable and Patient moving all extremities  Post vital signs: Reviewed and stable  Last Vitals:  Filed Vitals:   12/10/14 0502 12/10/14 0510  BP: 113/78 113/78  Pulse: 92 92  Temp: 36.6 C 36.6 C  Resp: 18 18    Complications: No apparent anesthesia complications

## 2014-12-10 NOTE — Discharge Instructions (Addendum)
INSTRUCTIONS AFTER JOINT REPLACEMENT   o Remove items at home which could result in a fall. This includes throw rugs or furniture in walking pathways o ICE to the affected joint every three hours while awake for 30 minutes at a time, for at least the first 3-5 days, and then as needed for pain and swelling.  Continue to use ice for pain and swelling. You may notice swelling that will progress down to the foot and ankle.  This is normal after surgery.  Elevate your leg when you are not up walking on it.   o Continue to use the breathing machine you got in the hospital (incentive spirometer) which will help keep your temperature down.  It is common for your temperature to cycle up and down following surgery, especially at night when you are not up moving around and exerting yourself.  The breathing machine keeps your lungs expanded and your temperature down.   DIET:  As you were doing prior to hospitalization, we recommend a well-balanced diet.  DRESSING / WOUND CARE / SHOWERING  Keep the surgical dressing until follow up.  The dressing is water proof, so you can shower without any extra covering.  IF THE DRESSING FALLS OFF or the wound gets wet inside, change the dressing with sterile gauze.  Please use good hand washing techniques before changing the dressing.  Do not use any lotions or creams on the incision until instructed by your surgeon.    ACTIVITY  o Increase activity slowly as tolerated, but follow the weight bearing instructions below.   o No driving for 6 weeks or until further direction given by your physician.  You cannot drive while taking narcotics.  o No lifting or carrying greater than 10 lbs. until further directed by your surgeon. o Avoid periods of inactivity such as sitting longer than an hour when not asleep. This helps prevent blood clots.  o You may return to work once you are authorized by your doctor.     WEIGHT BEARING   Weight bearing as tolerated with assist  device (walker, cane, etc) as directed, use it as long as suggested by your surgeon or therapist, typically at least 4-6 weeks.   EXERCISES  Results after joint replacement surgery are often greatly improved when you follow the exercise, range of motion and muscle strengthening exercises prescribed by your doctor. Safety measures are also important to protect the joint from further injury. Any time any of these exercises cause you to have increased pain or swelling, decrease what you are doing until you are comfortable again and then slowly increase them. If you have problems or questions, call your caregiver or physical therapist for advice.   Rehabilitation is important following a joint replacement. After just a few days of immobilization, the muscles of the leg can become weakened and shrink (atrophy).  These exercises are designed to build up the tone and strength of the thigh and leg muscles and to improve motion. Often times heat used for twenty to thirty minutes before working out will loosen up your tissues and help with improving the range of motion but do not use heat for the first two weeks following surgery (sometimes heat can increase post-operative swelling).   These exercises can be done on a training (exercise) mat, on the floor, on a table or on a bed. Use whatever works the best and is most comfortable for you.    Use music or television while you are exercising so that  the exercises are a pleasant break in your day. This will make your life better with the exercises acting as a break in your routine that you can look forward to.   Perform all exercises about fifteen times, three times per day or as directed.  You should exercise both the operative leg and the other leg as well.  Exercises include:    Quad Sets - Tighten up the muscle on the front of the thigh (Quad) and hold for 5-10 seconds.    Straight Leg Raises - With your knee straight (if you were given a brace, keep it on),  lift the leg to 60 degrees, hold for 3 seconds, and slowly lower the leg.  Perform this exercise against resistance later as your leg gets stronger.   Leg Slides: Lying on your back, slowly slide your foot toward your buttocks, bending your knee up off the floor (only go as far as is comfortable). Then slowly slide your foot back down until your leg is flat on the floor again.   Angel Wings: Lying on your back spread your legs to the side as far apart as you can without causing discomfort.   Hamstring Strength:  Lying on your back, push your heel against the floor with your leg straight by tightening up the muscles of your buttocks.  Repeat, but this time bend your knee to a comfortable angle, and push your heel against the floor.  You may put a pillow under the heel to make it more comfortable if necessary.   A rehabilitation program following joint replacement surgery can speed recovery and prevent re-injury in the future due to weakened muscles. Contact your doctor or a physical therapist for more information on knee rehabilitation.    CONSTIPATION  Constipation is defined medically as fewer than three stools per week and severe constipation as less than one stool per week.  Even if you have a regular bowel pattern at home, your normal regimen is likely to be disrupted due to multiple reasons following surgery.  Combination of anesthesia, postoperative narcotics, change in appetite and fluid intake all can affect your bowels.   YOU MUST use at least one of the following options; they are listed in order of increasing strength to get the job done.  They are all available over the counter, and you may need to use some, POSSIBLY even all of these options:    Drink plenty of fluids (prune juice may be helpful) and high fiber foods Colace 100 mg by mouth twice a day  Senokot for constipation as directed and as needed Dulcolax (bisacodyl), take with full glass of water  Miralax (polyethylene glycol)  once or twice a day as needed.  If you have tried all these things and are unable to have a bowel movement in the first 3-4 days after surgery call either your surgeon or your primary doctor.    If you experience loose stools or diarrhea, hold the medications until you stool forms back up.  If your symptoms do not get better within 1 week or if they get worse, check with your doctor.  If you experience "the worst abdominal pain ever" or develop nausea or vomiting, please contact the office immediately for further recommendations for treatment.   ITCHING:  If you experience itching with your medications, try taking only a single pain pill, or even half a pain pill at a time.  You can also use Benadryl over the counter for itching or also to  help with sleep.   TED HOSE STOCKINGS:  Use stockings on both legs until for at least 2 weeks or as directed by physician office. They may be removed at night for sleeping.  MEDICATIONS:  See your medication summary on the After Visit Summary that nursing will review with you.  You may have some home medications which will be placed on hold until you complete the course of blood thinner medication.  It is important for you to complete the blood thinner medication as prescribed.  PRECAUTIONS:  If you experience chest pain or shortness of breath - call 911 immediately for transfer to the hospital emergency department.   If you develop a fever greater that 101 F, purulent drainage from wound, increased redness or drainage from wound, foul odor from the wound/dressing, or calf pain - CONTACT YOUR SURGEON.                                                   FOLLOW-UP APPOINTMENTS:  If you do not already have a post-op appointment, please call the office for an appointment to be seen by your surgeon.  Guidelines for how soon to be seen are listed in your After Visit Summary, but are typically between 1-4 weeks after surgery.  OTHER INSTRUCTIONS:   Knee  Replacement:  Do not place pillow under knee, focus on keeping the knee straight while resting.   MAKE SURE YOU:   Understand these instructions.   Get help right away if you are not doing well or get worse.    Thank you for letting us be a part of your medical care team.  It is a privilege we respect greatly.  We hope these instructions will help you stay on track for a fast and full recovery!       Spinal Anesthesia and Epidural Anesthesia, Care After Refer to this sheet in the next few weeks. These instructions provide you with information about caring for yourself after your procedure. Your health care provider may also give you more specific instructions. Your treatment has been planned according to current medical practices, but problems sometimes occur. Call your health care provider if you have any problems or questions after your procedure. WHAT TO EXPECT AFTER THE PROCEDURE After your procedure, it is typical to have the following:  Sleepiness.  Nausea and vomiting. HOME CARE INSTRUCTIONS  For the first 24 hours after anesthetic medicine:  Do not drive or operate heavy machinery.  Do not drink alcohol.  Do not make important decisions.  Have someone stay with you for at least 24-48 hours.  Drink enough fluid to keep your urine clear or pale yellow. SEEK MEDICAL CARE IF:  You have nausea and vomiting that continue the day after anesthetic medicine was given.  You develop a rash. SEEK IMMEDIATE MEDICAL CARE IF:   You have a fever.  You have a persistent or severe headache.  You develop blurred or double vision.  You develop dizziness or lightheadedness.  You faint.  You have weakness, numbness, or tingling in your arms or legs.  You have difficulty breathing.  You are unable to pass urine.   This information is not intended to replace advice given to you by your health care provider. Make sure you discuss any questions you have with your health care  provider.   Document  Released: 03/20/2003 Document Revised: 01/18/2014 Document Reviewed: 08/08/2013 Elsevier Interactive Patient Education [employer].

## 2014-12-10 NOTE — Discharge Summary (Signed)
Physician Discharge Summary  Patient ID: MALYSSA MARIS MRN: [id number] DOB/AGE: [birth year] 35 y.o.  Admit date: 12/10/2014 Discharge date: 12/10/2014   Procedures:  Procedure(s) (LRB): TOTAL RIGHT HIP ARTHROPLASTY ANTERIOR APPROACH (Right)  Attending Physician:  Dr. Paralee Cancel   Admission Diagnoses:   Right hip AVN / pain  Discharge Diagnoses:  Principal Problem:   S/P right THA, AA  Past Medical History  Diagnosis Date  . Lyme disease 2008  . Colitis, ulcerative ([place])   . Personal history of colonic polyps   . Avascular necrosis ([place])   . Allergy   . Anemia   . Anorexia     history of/received inpt treatment in [place]  . 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 female, has a history of pain and functional disability in the right hip(s) due to arthritis and AVN and patient has failed non-surgical conservative treatments for greater than 12 weeks to include NSAID's and/or analgesics and activity modification. Onset of symptoms was gradual starting in January / February 2016 with gradually worsening course since that time.The patient noted prior procedures of the hip to include arthroplasty on the left hip in December of 2015 per Dr. Alvan Dame. Patient currently rates pain in the right hip at 7 out of 10 with activity. Patient has night pain, worsening of pain with activity and weight bearing, trendelenberg gait, pain that interfers with activities of daily living and pain with passive range of motion. Patient has evidence of periarticular osteophytes, joint space narrowing and AVN by imaging studies. This condition presents safety issues increasing the risk of falls. There is no current active infection. Risks, benefits and expectations were discussed with the patient. Risks including but not limited to the risk of anesthesia, blood clots, nerve damage, blood vessel damage, failure of the prosthesis, infection and up to and  including death. Patient understand the risks, benefits and expectations and wishes to proceed with surgery.   PCP: Karis Juba, PA-C   Discharged Condition: good  Hospital Course:  Patient underwent the above stated procedure on 12/10/2014. Patient tolerated the procedure well and brought to the recovery room in good condition and subsequently short stay to be discharged home.  PT worked with the patient prior to discharge home.  Per PT report it was stressed to the patient to use her walker for 2 weeks.   Discharge Exam: General appearance: alert, cooperative and no distress Extremities: Homans sign is negative, no sign of DVT, no edema, redness or tenderness in the calves or thighs and no ulcers, gangrene or trophic changes  Disposition: Home with follow up in 2 weeks   Follow-up Information    Follow up with Mauri Pole, MD. Schedule an appointment as soon as possible for a visit in 2 weeks.   Specialty:  Orthopedic Surgery   Contact information:   [address] [place] [address] [phone]       Discharge Instructions    Call MD / Call 911    Complete by:  As directed   If you experience chest pain or shortness of breath, CALL 911 and be transported to the hospital emergency room.  If you develope a fever above 101 F, pus (white drainage) or increased drainage or redness at the wound, or calf pain, call your surgeon's office.     Change dressing    Complete by:  As directed  Maintain surgical dressing until follow up in the clinic. If the edges start to pull up, may reinforce with tape. If the dressing is no longer working, may remove and cover with gauze and tape, but must keep the area dry and clean.  Call with any questions or concerns.     Constipation Prevention    Complete by:  As directed   Drink plenty of fluids.  Prune juice may be helpful.  You may use a stool softener, such as Colace (over the counter) 100 mg twice a day.  Use MiraLax  (over the counter) for constipation as needed.     Diet - low sodium heart healthy    Complete by:  As directed      Discharge instructions    Complete by:  As directed   Maintain surgical dressing until follow up in the clinic. If the edges start to pull up, may reinforce with tape. If the dressing is no longer working, may remove and cover with gauze and tape, but must keep the area dry and clean.  Follow up in 2 weeks at [facility]. Call with any questions or concerns.     Increase activity slowly as tolerated    Complete by:  As directed      TED hose    Complete by:  As directed   Use stockings (TED hose) for 2 weeks on both leg(s).  You may remove them at night for sleeping.     Weight bearing as tolerated    Complete by:  As directed   USE A WALKER AT ALL TIMES FOR THE FIRST 2 WEEKS!  Laterality:  right  Extremity:  Lower             Medication List    STOP taking these medications        acetaminophen 500 MG tablet  Commonly known as:  TYLENOL      TAKE these medications        aspirin EC 325 MG tablet  Take 1 tablet (325 mg total) by mouth 2 (two) times daily. Take for 4 weeks.     CALCIUM CITRATE + PO  Take 1 tablet by mouth every morning.     cephALEXin 500 MG capsule  Commonly known as:  KEFLEX  Take 1 capsule (500 mg total) by mouth 3 (three) times daily.     clonazePAM 0.5 MG tablet  Commonly known as:  KLONOPIN  One once daily as needed for sleep/insomnia.     DEPO-PROVERA 150 MG/ML injection  Generic drug:  medroxyPROGESTERone  Inject 150 mg into the muscle every 3 (three) months.     HYDROcodone-acetaminophen 7.5-325 MG tablet  Commonly known as:  NORCO  Take 1-2 tablets by mouth every 4 (four) hours as needed for moderate pain.     HYDROmorphone 2 MG tablet  Commonly known as:  DILAUDID  Take 1-2 tablets (2-4 mg total) by mouth 2 (two) times daily as needed for severe pain (breakthrough pain).     mesalamine 1.2 G EC tablet    Commonly known as:  LIALDA  Take 2 tablets (2.4 g total) by mouth 2 (two) times daily.     methocarbamol 500 MG tablet  Commonly known as:  ROBAXIN  Take 1 tablet (500 mg total) by mouth every 6 (six) hours as needed for muscle spasms.     Vitamin D 2000 UNITS tablet  Take 4,000 Units by mouth daily.  Signed: [place] Pugh. Nikko Goldwire   PA-C  12/10/2014, 5:40 PM

## 2014-12-10 NOTE — Op Note (Signed)
NAME:  Brianna Shaw NO.: [id number]      MEDICAL RECORD NO.: [id number]      FACILITY:  [facility]      PHYSICIAN:  Paralee Cancel D  DATE OF BIRTH:  01/23/1980     DATE OF PROCEDURE:  12/10/2014                                 OPERATIVE REPORT         PREOPERATIVE DIAGNOSIS: Right  hip avascular necrosis.      POSTOPERATIVE DIAGNOSIS:  Right hip avascular necrosis.  History of left THR      PROCEDURE:  Right total hip replacement through an anterior approach   utilizing DePuy THR system, component size 31m pinnacle cup, a size 29mneutral   Altrex liner, a size 5 Hi Tri Lock stem with a 28+1.5 delta ceramic   ball.      SURGEON:  Brianna CassisOlAlvan DameM.D.      ASSISTANT:  MaDanae OrleansPA-C     ANESTHESIA:  Spinal.      SPECIMENS:  None.      COMPLICATIONS:  None.      BLOOD LOSS:  200 cc     DRAINS:  None.      INDICATION OF THE PROCEDURE:  Brianna Shaw who had   presented to office for evaluation of right hip pain.  Radiographs revealed avascular necrosis with collapse of femoral head.  The patient had painful limited range of   motion significantly affecting their overall quality of life.  The patient was failing to    respond to conservative measures, and at this point was ready   to proceed with more definitive measures.  The patient has noted progressive   degenerative changes in his hip, progressive problems and dysfunction   with regarding the hip prior to surgery.  Consent was obtained for   benefit of pain relief.  Specific risk of infection, DVT, component   failure, dislocation, need for revision surgery, as well discussion of   the anterior versus posterior approach were reviewed.  Consent was   obtained for benefit of anterior pain relief through an anterior   approach.      PROCEDURE IN DETAIL:  The patient was brought to operative theater.   Once adequate anesthesia, preoperative  antibiotics, 2gm of Ancef, 1 gm of Tranexamic Acid, and 10 mg of Decadron administered.   The patient was positioned supine on the OSI Hanna table.  Once adequate   padding of boney process was carried out, we had predraped out the hip, and  used fluoroscopy to confirm orientation of the pelvis and position.      The right hip was then prepped and draped from proximal iliac crest to   mid thigh with shower curtain technique.      Time-out was performed identifying the patient, planned procedure, and   extremity.     An incision was then made 2 cm distal and lateral to the   anterior superior iliac spine extending over the orientation of the   tensor fascia lata muscle and sharp dissection was carried down to the   fascia of the muscle and protractor placed in the soft tissues.      The fascia was then  incised.  The muscle belly was identified and swept   laterally and retractor placed along the superior neck.  Following   cauterization of the circumflex vessels and removing some pericapsular   fat, a second cobra retractor was placed on the inferior neck.  A third   retractor was placed on the anterior acetabulum after elevating the   anterior rectus.  A L-capsulotomy was along the line of the   superior neck to the trochanteric fossa, then extended proximally and   distally.  Tag sutures were placed and the retractors were then placed   intracapsular.  We then identified the trochanteric fossa and   orientation of my neck cut, confirmed this radiographically   and then made a neck osteotomy with the femur on traction.  The femoral   head was removed without difficulty or complication.  Traction was let   off and retractors were placed posterior and anterior around the   acetabulum.      The labrum and foveal tissue were debrided.  I began reaming with a 70m   reamer and reamed up to 444mreamer with good bony bed preparation and a 5031m cup was chosen.  The final 50m83minnacle  cup was then impacted under fluoroscopy  to confirm the depth of penetration and orientation with respect to   abduction.  A screw was placed followed by the hole eliminator.  The final   28mm55mamic liner was impacted with good visualized rim fit.  The cup was positioned anatomically within the acetabular portion of the pelvis.      At this point, the femur was rolled at 80 degrees.  Further capsule was   released off the inferior aspect of the femoral neck.  I then   released the superior capsule proximally.  The hook was placed laterally   along the femur and elevated manually and held in position with the bed   hook.  The leg was then extended and adducted with the leg rolled to 100   degrees of external rotation.  Once the proximal femur was fully   exposed, I used a box osteotome to set orientation.  I then began   broaching with the starting chili pepper broach and passed this by hand and then broached up to 5 to match the other hip.  With the 5 broach in place I chose a high offset neck and did a trial reduction with the 28+1.5 head ball.  The offset was appropriate, leg lengths   appeared to be equal, confirmed radiographically.   Given these findings, I went ahead and dislocated the hip, repositioned all   retractors and positioned the right hip in the extended and abducted position.  The final 5 Hi Tri Lock stem was   chosen and it was impacted down to the level of neck cut.  Based on this   and the trial reduction, a 28+1.5 delta ceramic ball was chosen and   impacted onto a clean and dry trunnion, and the hip was reduced.  The   hip had been irrigated throughout the case again at this point.  I did   reapproximate the superior capsular leaflet to the anterior leaflet   using #1 Vicryl.  The fascia of the   tensor fascia lata muscle was then reapproximated using #1 Vicryl and #0 V-lock sutures.  The   remaining wound was closed with 2-0 Vicryl and running 4-0 Monocryl.   The hip  was cleaned,  dried, and dressed sterilely using Dermabond and   Aquacel dressing.  She was then brought   to recovery room in stable condition tolerating the procedure well.    Brianna Orleans, PA-C was present for the entirety of the case involved from   preoperative positioning, perioperative retractor management, general   facilitation of the case, as well as primary wound closure as assistant.            Pietro Cassis Alvan Dame, M.D.        12/10/2014 8:49 AM

## 2014-12-10 NOTE — Evaluation (Addendum)
Physical Therapy Evaluation Patient Details Name: Brianna Shaw MRN: [id number] DOB: Sep 13, 1980 Today's Date: 12/10/2014   History of Present Illness  34 yo female s/p R THA-direct anterior 12/10/14. Hx of L THA-direct anterior 2015, Lyme's disease, AVN  Clinical Impression  On eval POD 0, pt was Min guard assist for mobility-walked ~100 feet with RW. During session pt became diaphoretic and mildly nauseous. BP 107/75. Assisted pt back to bed. Made RN aware and requested nursing ambulate with pt again later today prior to d/c home to ensure pt is safely tolerating activity. Recommend HHPT and supervision for OOB/mobility. Also informed pt of MD order to use walker for 2 weeks.     Follow Up Recommendations Home health PT;Supervision for mobility/OOB (pt prefers [facility] follow up)    Equipment Recommendations  None recommended by PT    Recommendations for Other Services       Precautions / Restrictions Precautions Precautions: Fall Restrictions Weight Bearing Restrictions: No RLE Weight Bearing: Weight bearing as tolerated      Mobility  Bed Mobility Overal bed mobility: Needs Assistance Bed Mobility: Supine to Sit;Sit to Supine     Supine to sit: Min guard Sit to supine: Min guard   General bed mobility comments: close guard for safety.   Transfers Overall transfer level: Needs assistance Equipment used: Rolling walker (2 wheeled) Transfers: Sit to/from Stand Sit to Stand: Min guard         General transfer comment: close guard for safety. VCs safety, hand placement  Ambulation/Gait Ambulation/Gait assistance: Min guard Ambulation Distance (Feet): 100 Feet Assistive device: Rolling walker (2 wheeled) Gait Pattern/deviations: Step-to pattern;Antalgic     General Gait Details: close guard for safety. VCs sequence.  Stairs  No steps to enter apartment.          Wheelchair Mobility    Modified Rankin (Stroke Patients Only)       Balance                                              Pertinent Vitals/Pain Pain Assessment: 0-10 Pain Score: 5  Pain Location: R hip Pain Descriptors / Indicators: Sore Pain Intervention(s): Monitored during session;Ice applied;Repositioned    Home Living Family/patient expects to be discharged to:: Private residence Living Arrangements: Alone Available Help at Discharge: Family Type of Home: Apartment Home Access: [place]: One level Home Equipment: Environmental consultant - 2 wheels;Shower seat      Prior Function Level of Independence: Independent               Hand Dominance        Extremity/Trunk Assessment   Upper Extremity Assessment: Overall WFL for tasks assessed           Lower Extremity Assessment: RLE deficits/detail RLE Deficits / Details: at least: hip flex 2/5, hip abd/add 2/5, moves ankle well.     Cervical / Trunk Assessment: Normal  Communication   Communication: No difficulties  Cognition Arousal/Alertness: Awake/alert Behavior During Therapy: WFL for tasks assessed/performed Overall Cognitive Status: Within Functional Limits for tasks assessed                      General Comments      Exercises Total Joint Exercises Ankle Circles/Pumps: AROM;Both;10 reps;Supine Quad Sets: AROM;Both;10 reps;Supine Heel Slides: AAROM;Right;10 reps;Supine Hip ABduction/ADduction: AAROM;Right;10 reps;Supine  Assessment/Plan    PT Assessment All further PT needs can be met in the next venue of care (HHPT)  PT Diagnosis Difficulty walking;Acute pain   PT Problem List    PT Treatment Interventions     PT Goals (Current goals can be found in the Care Plan section) Acute Rehab PT Goals Patient Stated Goal: home today PT Goal Formulation: All assessment and education complete, DC therapy    Frequency     Barriers to discharge        Co-evaluation               End of Session Equipment Utilized During Treatment: Gait  belt Activity Tolerance:  (Limited by diaphoresis, mild nausea) Patient left: in bed;with call bell/phone within reach;with family/visitor present      Functional Assessment Tool Used: clinical judgement Functional Limitation: Mobility: Walking and moving around Mobility: Walking and Moving Around Current Status (D3570): At least 1 percent but less than 20 percent impaired, limited or restricted Mobility: Walking and Moving Around Goal Status [phone]): At least 1 percent but less than 20 percent impaired, limited or restricted Mobility: Walking and Moving Around Discharge Status [phone]): At least 1 percent but less than 20 percent impaired, limited or restricted    Time: 1348-1410 PT Time Calculation (min) (ACUTE ONLY): 22 min   Charges:   PT Evaluation $Initial PT Evaluation Tier I: 1 Procedure     PT G Codes:   PT G-Codes **NOT FOR INPATIENT CLASS** Functional Assessment Tool Used: clinical judgement Functional Limitation: Mobility: Walking and moving around Mobility: Walking and Moving Around Current Status (P2330): At least 1 percent but less than 20 percent impaired, limited or restricted Mobility: Walking and Moving Around Goal Status [phone]): At least 1 percent but less than 20 percent impaired, limited or restricted Mobility: Walking and Moving Around Discharge Status [phone]): At least 1 percent but less than 20 percent impaired, limited or restricted    Weston Anna, MPT Pager: [phone]

## 2014-12-30 ENCOUNTER — Telehealth: Payer: Self-pay | Admitting: Physician Assistant

## 2014-12-30 NOTE — Telephone Encounter (Signed)
Amoxicillin 500 mg-----take 4 pills for total of 2 grams. Take 30-60 minutes prior to procedure. ---- dispense #4+0

## 2014-12-30 NOTE — Telephone Encounter (Signed)
[facility]  Patient is having a dental procedure coming up and would like to get antibiotic if possible called in before this is done  [phone] (H)

## 2014-12-31 MED ORDER — AMOXICILLIN 500 MG PO CAPS: ORAL_CAPSULE | ORAL | Status: DC

## 2014-12-31 NOTE — Telephone Encounter (Signed)
RX to pharmacy.  Left message for patient

## 2015-05-14 ENCOUNTER — Telehealth: Payer: Self-pay | Admitting: Physician Assistant

## 2015-05-14 NOTE — Telephone Encounter (Signed)
Pt is requesting a copy of her immunization records. [phone]

## 2015-05-15 NOTE — Telephone Encounter (Signed)
Pt got copy of [facility] report

## 2015-10-01 IMAGING — CR DG PORTABLE PELVIS
1 series · 1 of 1 positions shown · non-contrast
Comparison: 01/11/2012

CLINICAL DATA: Hip replacement

EXAM:
PORTABLE PELVIS 1-2 VIEWS

[AP]
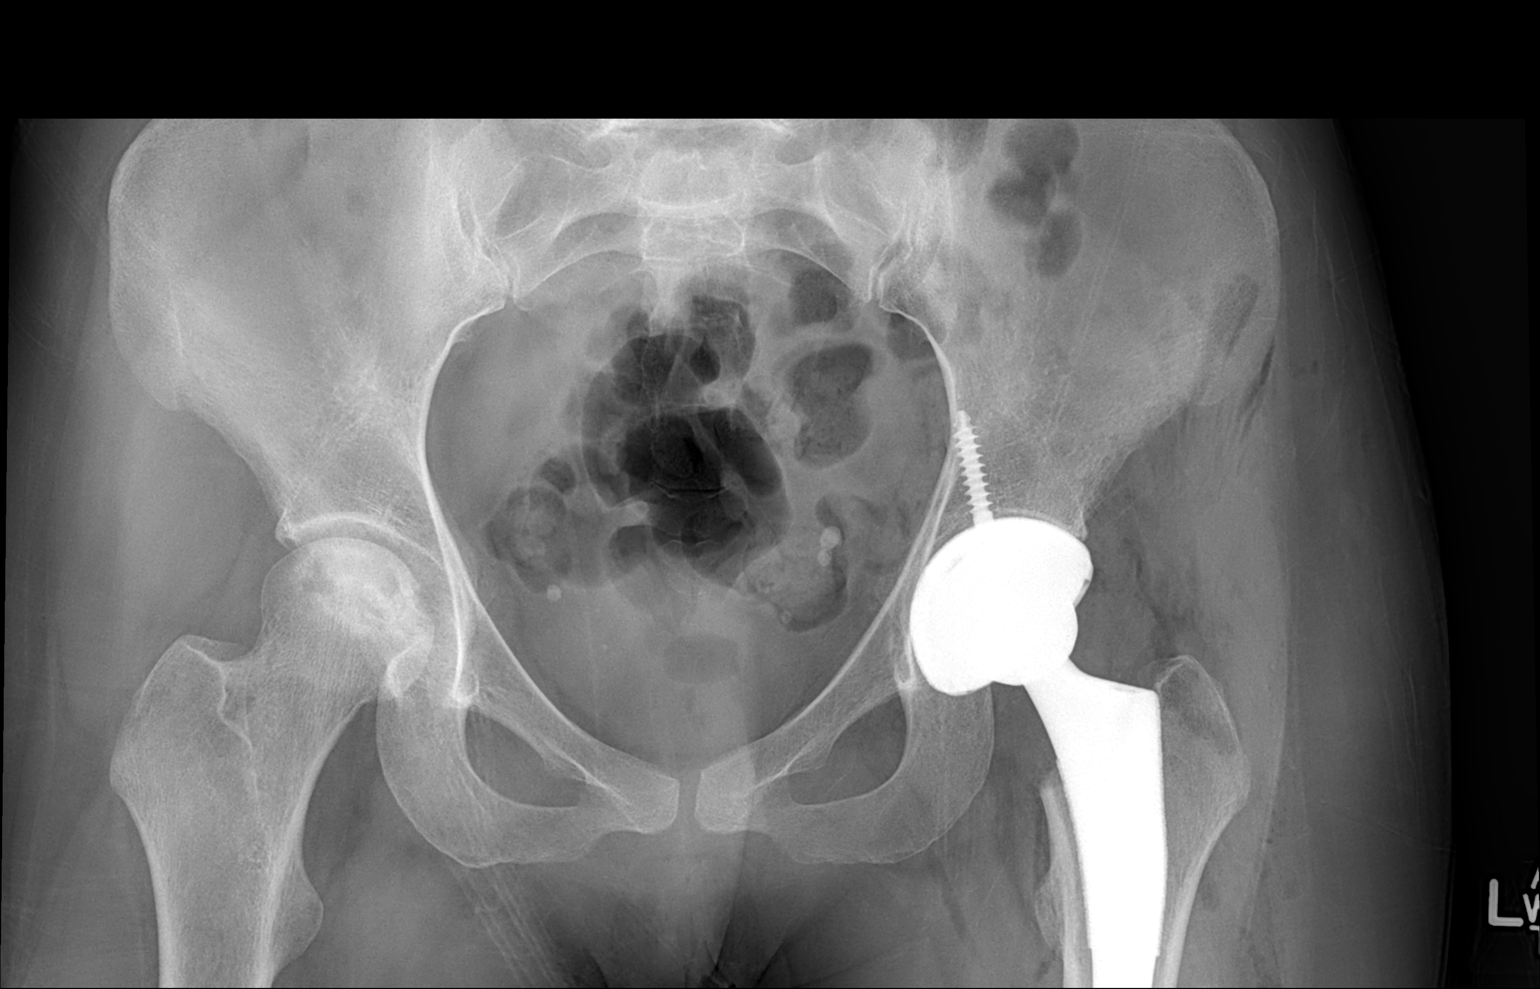

[1 of 1 positions shown; findings below may reference images not displayed]

FINDINGS: Sclerosis and cystic changes in the right femoral had are compatible
with avascular necrosis. Left total hip arthroplasty in place.
Anatomic alignment of the osseous and prostatic structures. No
breakage or loosening of the hardware. No fracture.
IMPRESSION: Avascular necrosis of the right femoral head

Left total hip arthroplasty anatomically aligned.

## 2015-12-03 ENCOUNTER — Other Ambulatory Visit: Payer: Self-pay | Admitting: Internal Medicine

## 2015-12-03 DIAGNOSIS — K5100 Ulcerative (chronic) pancolitis without complications: Secondary | ICD-10-CM

## 2016-02-03 ENCOUNTER — Other Ambulatory Visit: Payer: Self-pay | Admitting: Internal Medicine

## 2016-02-03 DIAGNOSIS — K5100 Ulcerative (chronic) pancolitis without complications: Secondary | ICD-10-CM

## 2016-02-12 ENCOUNTER — Encounter: Payer: Self-pay | Admitting: Physician Assistant

## 2016-02-12 ENCOUNTER — Telehealth: Payer: Self-pay | Admitting: Internal Medicine

## 2016-02-12 DIAGNOSIS — K5100 Ulcerative (chronic) pancolitis without complications: Secondary | ICD-10-CM

## 2016-02-12 MED ORDER — MESALAMINE 1.2 G PO TBEC: DELAYED_RELEASE_TABLET | ORAL | 0 refills | Status: DC

## 2016-02-12 NOTE — Telephone Encounter (Signed)
Spoke with Blair Hailey and she said Adamae has moved to a different Apt in [place] so we updated her address and I will mail her a rx for the generic Lialda so that she can see which pharmacy is going to be the best deal for her.  Mom aware that we need to make her a follow up appointment, last seen 11/2014.  Arthi doesn't have insurance currently. I'm enclosing several rx discount cards to see if that will help her, one is the [facility] card.  Arbie Cookey called because Jolanda works nights and she is currently asleep.

## 2016-07-13 ENCOUNTER — Telehealth: Payer: Self-pay | Admitting: Internal Medicine

## 2016-07-13 DIAGNOSIS — K5100 Ulcerative (chronic) pancolitis without complications: Secondary | ICD-10-CM

## 2016-07-13 NOTE — Telephone Encounter (Signed)
May we send this in Sir?

## 2016-07-14 NOTE — Telephone Encounter (Signed)
Refill for 3 mos ok Ask her to make a f/u appt

## 2016-07-15 MED ORDER — MESALAMINE 1.2 G PO TBEC: DELAYED_RELEASE_TABLET | ORAL | 2 refills | Status: DC

## 2016-07-15 NOTE — Telephone Encounter (Signed)
Refilled her Lialda and noted for her to make appointment.

## 2016-07-19 MED FILL — LIALDA 1.2 GM TABLET SA: 1.2 | 30 days supply | Qty: 120 | Fill #0

## 2016-09-08 ENCOUNTER — Telehealth: Payer: Self-pay | Admitting: Physician Assistant

## 2016-09-08 NOTE — Telephone Encounter (Signed)
error 

## 2016-09-10 ENCOUNTER — Telehealth: Payer: Self-pay | Admitting: Physician Assistant

## 2016-09-10 NOTE — Telephone Encounter (Signed)
Pt calling to get clarification about amoxicillin for dental procedure she is having done next week. We have prescribed it for her before and needs to know the dosage so the dentist can call it in to pharmacy for her

## 2016-09-10 NOTE — Telephone Encounter (Signed)
Spoke with patient dosing info for amoxicillin has been provided to patient she states she will have her dentist call in a rx for her

## 2016-10-21 ENCOUNTER — Telehealth: Payer: Self-pay | Admitting: Internal Medicine

## 2016-10-21 DIAGNOSIS — K5100 Ulcerative (chronic) pancolitis without complications: Secondary | ICD-10-CM

## 2016-10-21 MED ORDER — MESALAMINE 1.2 G PO TBEC: DELAYED_RELEASE_TABLET | ORAL | 0 refills | Status: DC

## 2016-10-21 MED FILL — LIALDA 1.2 GM TABLET SA: 1.2 | 30 days supply | Qty: 120 | Fill #0

## 2016-10-21 NOTE — Telephone Encounter (Signed)
Lialda refilled and marked to call for appt, overdue.

## 2017-01-07 ENCOUNTER — Encounter: Payer: Self-pay | Admitting: Internal Medicine

## 2017-01-27 MED FILL — LIALDA 1.2 GM TABLET SA: 1.2 | 30 days supply | Qty: 120 | Fill #1

## 2017-04-20 DIAGNOSIS — Z6821 Body mass index (BMI) 21.0-21.9, adult: Secondary | ICD-10-CM | POA: Diagnosis not present

## 2017-04-20 DIAGNOSIS — Z3042 Encounter for surveillance of injectable contraceptive: Secondary | ICD-10-CM | POA: Diagnosis not present

## 2017-04-20 DIAGNOSIS — Z01419 Encounter for gynecological examination (general) (routine) without abnormal findings: Secondary | ICD-10-CM | POA: Diagnosis not present

## 2017-05-19 DIAGNOSIS — Z1231 Encounter for screening mammogram for malignant neoplasm of breast: Secondary | ICD-10-CM | POA: Diagnosis not present

## 2017-06-10 ENCOUNTER — Telehealth: Payer: Self-pay | Admitting: Internal Medicine

## 2017-06-10 DIAGNOSIS — K5100 Ulcerative (chronic) pancolitis without complications: Secondary | ICD-10-CM

## 2017-06-10 MED ORDER — MESALAMINE 1.2 G PO TBEC: DELAYED_RELEASE_TABLET | ORAL | 0 refills | Status: DC

## 2017-06-10 NOTE — Telephone Encounter (Signed)
Spoke to patient and confirmed pharmacy. Sent in [place] and we will see her at her August appointment.

## 2017-06-13 MED FILL — LIALDA 1.2 GM TABLET SA: 1.2 | 90 days supply | Qty: 360 | Fill #0

## 2017-08-22 DIAGNOSIS — M25522 Pain in left elbow: Secondary | ICD-10-CM | POA: Diagnosis not present

## 2017-09-07 ENCOUNTER — Ambulatory Visit: Payer: Self-pay | Admitting: Internal Medicine

## 2017-10-21 ENCOUNTER — Encounter: Payer: Self-pay | Admitting: Internal Medicine

## 2017-10-21 ENCOUNTER — Other Ambulatory Visit (INDEPENDENT_AMBULATORY_CARE_PROVIDER_SITE_OTHER): Payer: 59

## 2017-10-21 ENCOUNTER — Ambulatory Visit: Payer: 59 | Admitting: Internal Medicine

## 2017-10-21 DIAGNOSIS — Z86010 Personal history of colonic polyps: Secondary | ICD-10-CM

## 2017-10-21 DIAGNOSIS — K5100 Ulcerative (chronic) pancolitis without complications: Secondary | ICD-10-CM

## 2017-10-21 LAB — CBC WITH DIFFERENTIAL/PLATELET
BASOS ABS: 0 10*3/uL (ref 0.0–0.1)
Basophils Relative: 0.3 % (ref 0.0–3.0)
EOS PCT: 1.8 % (ref 0.0–5.0)
Eosinophils Absolute: 0.2 10*3/uL (ref 0.0–0.7)
HCT: 41.1 % (ref 36.0–46.0)
HEMOGLOBIN: 13.9 g/dL (ref 12.0–15.0)
LYMPHS ABS: 2.6 10*3/uL (ref 0.7–4.0)
LYMPHS PCT: 27.7 % (ref 12.0–46.0)
MCHC: 33.9 g/dL (ref 30.0–36.0)
MCV: 95.9 fl (ref 78.0–100.0)
MONOS PCT: 8.2 % (ref 3.0–12.0)
Monocytes Absolute: 0.8 10*3/uL (ref 0.1–1.0)
NEUTROS PCT: 62 % (ref 43.0–77.0)
Neutro Abs: 5.8 10*3/uL (ref 1.4–7.7)
Platelets: 200 10*3/uL (ref 150.0–400.0)
RBC: 4.29 Mil/uL (ref 3.87–5.11)
RDW: 13.2 % (ref 11.5–15.5)
WBC: 9.3 10*3/uL (ref 4.0–10.5)

## 2017-10-21 LAB — COMPREHENSIVE METABOLIC PANEL
ALK PHOS: 61 U/L (ref 39–117)
ALT: 38 U/L — AB (ref 0–35)
AST: 28 U/L (ref 0–37)
Albumin: 4.6 g/dL (ref 3.5–5.2)
BILIRUBIN TOTAL: 0.8 mg/dL (ref 0.2–1.2)
BUN: 23 mg/dL (ref 6–23)
CALCIUM: 9.8 mg/dL (ref 8.4–10.5)
CO2: 28 meq/L (ref 19–32)
Chloride: 101 mEq/L (ref 96–112)
Creatinine, Ser: 0.78 mg/dL (ref 0.40–1.20)
GFR: 87.88 mL/min (ref >60.00)
GLUCOSE: 77 mg/dL (ref 70–99)
Potassium: 4.1 mEq/L (ref 3.5–5.1)
Sodium: 138 mEq/L (ref 135–145)
Total Protein: 8 g/dL (ref 6.0–8.3)

## 2017-10-21 MED ORDER — MESALAMINE 1.2 G PO TBEC: DELAYED_RELEASE_TABLET | ORAL | 3 refills | Status: DC

## 2017-10-21 MED FILL — LIALDA 1.2 GM TABLET SA: 1.2 | 90 days supply | Qty: 360 | Fill #0

## 2017-10-21 NOTE — Assessment & Plan Note (Signed)
Colonoscopy follow-up early 2020

## 2017-10-21 NOTE — Assessment & Plan Note (Signed)
Refill Lialda Check labs Colonoscopy Feb 2020

## 2017-10-21 NOTE — Patient Instructions (Signed)
  Your provider has requested that you go to the basement level for lab work before leaving today. Press "B" on the elevator. The lab is located at the first door on the left as you exit the elevator.   We will put you in for a colon recall for 01/2018.   We have sent the following medications to your pharmacy for you to pick up at your convenience: lialda    I appreciate the opportunity to care for you. [name], MD, [facility]

## 2017-10-21 NOTE — Progress Notes (Signed)
   Brianna Shaw 37 y.o. December 29, 1980 [id number]  Assessment & Plan:  Ulcerative colitis ([place]) Refill Lialda Check labs Colonoscopy Feb 2020  COLONIC POLYPS, ADENOMATOUS, HX OF Colonoscopy follow-up early 2020      Subjective:   Chief Complaint: Follow-up of ulcerative colitis  HPI Brianna Shaw is here, having last been seen in 2016 for a colonoscopy that had some pseudopolyps and some patchy  mild inflammation.  He is asymptomatic without diarrhea abdominal pain rectal bleeding on 4.8 g mesalamine in the form of Lialda daily.  No labs since hip surgery in 2015 I think.  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 Allergies  Allergen Reactions  . Flagyl [Metronidazole] Nausea And Vomiting    Po medication only, tolerates IV   Current Meds  Medication Sig  . amoxicillin (AMOXIL) 500 MG capsule Take 4 pills for total 2 grams.  Take 30-60 minutes prior to dental procedure. (Patient taking differently: Take 4 pills for total 2 grams.  Take 30-60 minutes prior to dental procedure.  Because she has had bilateral hip replacements)  . Ascorbic Acid (VITAMIN C) 1000 MG tablet Take 1,000 mg by mouth daily.  . Calcium Citrate-Vitamin D (CALCIUM CITRATE + PO) Take 1 tablet by mouth every morning.  . cetirizine (ZYRTEC) 10 MG tablet Take 10 mg by mouth daily.  . Cholecalciferol (VITAMIN D) 2000 UNITS tablet Take 4,000 Units by mouth daily.  . clonazePAM (KLONOPIN) 0.5 MG tablet One once daily as needed for sleep/insomnia.  . medroxyPROGESTERone (DEPO-PROVERA) 150 MG/ML injection Inject 150 mg into the muscle every 3 (three) months.  . mesalamine (LIALDA) 1.2 g EC tablet TAKE 2 TABLETS(2.4 GRAMS) BY MOUTH TWICE DAILY  . [DISCONTINUED] mesalamine (LIALDA) 1.2 g EC tablet TAKE 2 TABLETS(2.4 GRAMS) BY MOUTH TWICE DAILY   Past Medical History:  Diagnosis Date  .  Allergy   . Anemia   . Anorexia    history of/received inpt treatment in [place]  . Avascular necrosis ([place])   . Colitis, ulcerative ([place])   . Lyme disease 2008  . Personal history of colonic polyps   . 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 COLONOSCOPY  multiple  . eustachian tube     placement in childhood then removed / bilat   . iron infusion    . TOTAL HIP ARTHROPLASTY Left 01/01/2014   Procedure: LEFT TOTAL HIP ARTHROPLASTY ANTERIOR APPROACH;  Surgeon: Mauri Pole, MD;  Location: [facility];  Service: Orthopedics;  Laterality: Left;  . TOTAL HIP ARTHROPLASTY Right 12/10/2014   Procedure: TOTAL RIGHT HIP ARTHROPLASTY ANTERIOR APPROACH;  Surgeon: Paralee Cancel, MD;  Location: [facility];  Service: Orthopedics;  Laterality: Right;  . 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   Review of Systems   Objective:   Physical Exam

## 2017-10-22 DIAGNOSIS — H5213 Myopia, bilateral: Secondary | ICD-10-CM | POA: Diagnosis not present

## 2017-10-24 NOTE — Progress Notes (Signed)
My chart note Labs ok except trivial ALT elevation

## 2018-03-22 ENCOUNTER — Encounter: Payer: Self-pay | Admitting: Internal Medicine

## 2018-04-14 ENCOUNTER — Encounter: Payer: Self-pay | Admitting: Internal Medicine

## 2018-04-26 MED FILL — LIALDA 1.2 GM TABLET SA: 1.2 | 90 days supply | Qty: 360 | Fill #1

## 2018-07-11 DIAGNOSIS — Z1231 Encounter for screening mammogram for malignant neoplasm of breast: Secondary | ICD-10-CM | POA: Diagnosis not present

## 2018-07-11 DIAGNOSIS — Z114 Encounter for screening for human immunodeficiency virus [HIV]: Secondary | ICD-10-CM | POA: Diagnosis not present

## 2018-07-11 DIAGNOSIS — Z3042 Encounter for surveillance of injectable contraceptive: Secondary | ICD-10-CM | POA: Diagnosis not present

## 2018-07-11 DIAGNOSIS — Z118 Encounter for screening for other infectious and parasitic diseases: Secondary | ICD-10-CM | POA: Diagnosis not present

## 2018-07-11 DIAGNOSIS — Z803 Family history of malignant neoplasm of breast: Secondary | ICD-10-CM | POA: Diagnosis not present

## 2018-07-11 DIAGNOSIS — R5383 Other fatigue: Secondary | ICD-10-CM | POA: Diagnosis not present

## 2018-07-11 DIAGNOSIS — Z1159 Encounter for screening for other viral diseases: Secondary | ICD-10-CM | POA: Diagnosis not present

## 2018-07-11 DIAGNOSIS — Z113 Encounter for screening for infections with a predominantly sexual mode of transmission: Secondary | ICD-10-CM | POA: Diagnosis not present

## 2018-07-11 DIAGNOSIS — Z01419 Encounter for gynecological examination (general) (routine) without abnormal findings: Secondary | ICD-10-CM | POA: Diagnosis not present

## 2018-07-11 DIAGNOSIS — Z6822 Body mass index (BMI) 22.0-22.9, adult: Secondary | ICD-10-CM | POA: Diagnosis not present

## 2018-07-12 ENCOUNTER — Other Ambulatory Visit: Payer: Self-pay | Admitting: Obstetrics and Gynecology

## 2018-07-12 DIAGNOSIS — N6320 Unspecified lump in the left breast, unspecified quadrant: Secondary | ICD-10-CM

## 2018-07-26 ENCOUNTER — Ambulatory Visit
Admission: RE | Admit: 2018-07-26 | Discharge: 2018-07-26 | Disposition: A | Discharge status: Home / Self Care | Payer: 59 | Source: Ambulatory Visit | Attending: Obstetrics and Gynecology | Admitting: Obstetrics and Gynecology

## 2018-07-26 ENCOUNTER — Other Ambulatory Visit: Payer: Self-pay | Admitting: Obstetrics and Gynecology

## 2018-07-26 DIAGNOSIS — D242 Benign neoplasm of left breast: Secondary | ICD-10-CM | POA: Diagnosis not present

## 2018-07-26 DIAGNOSIS — N6320 Unspecified lump in the left breast, unspecified quadrant: Secondary | ICD-10-CM

## 2018-07-26 DIAGNOSIS — R922 Inconclusive mammogram: Secondary | ICD-10-CM | POA: Diagnosis not present

## 2018-07-26 DIAGNOSIS — N6323 Unspecified lump in the left breast, lower outer quadrant: Secondary | ICD-10-CM | POA: Diagnosis not present

## 2018-07-26 DIAGNOSIS — Z803 Family history of malignant neoplasm of breast: Secondary | ICD-10-CM | POA: Diagnosis not present

## 2018-10-05 MED FILL — LIALDA 1.2 GM TABLET SA: 1.2 | 90 days supply | Qty: 360 | Fill #2

## 2019-02-23 ENCOUNTER — Other Ambulatory Visit: Payer: Self-pay | Admitting: Internal Medicine

## 2019-02-23 DIAGNOSIS — K5100 Ulcerative (chronic) pancolitis without complications: Secondary | ICD-10-CM

## 2019-02-23 MED FILL — LIALDA 1.2 GM TABLET SA: 1.2 | 90 days supply | Qty: 360 | Fill #0

## 2019-05-21 DIAGNOSIS — Z96642 Presence of left artificial hip joint: Secondary | ICD-10-CM | POA: Diagnosis not present

## 2019-05-21 DIAGNOSIS — M25552 Pain in left hip: Secondary | ICD-10-CM | POA: Diagnosis not present

## 2019-05-21 DIAGNOSIS — T8484XA Pain due to internal orthopedic prosthetic devices, implants and grafts, initial encounter: Secondary | ICD-10-CM | POA: Diagnosis not present

## 2019-06-29 ENCOUNTER — Other Ambulatory Visit: Payer: Self-pay | Admitting: Internal Medicine

## 2019-06-29 DIAGNOSIS — K5100 Ulcerative (chronic) pancolitis without complications: Secondary | ICD-10-CM

## 2019-06-29 MED FILL — LIALDA 1.2 GM TABLET SA: 1.2 | 90 days supply | Qty: 360 | Fill #0

## 2019-07-30 DIAGNOSIS — Z1231 Encounter for screening mammogram for malignant neoplasm of breast: Secondary | ICD-10-CM | POA: Diagnosis not present

## 2019-07-30 DIAGNOSIS — Z6822 Body mass index (BMI) 22.0-22.9, adult: Secondary | ICD-10-CM | POA: Diagnosis not present

## 2019-07-30 DIAGNOSIS — Z13220 Encounter for screening for lipoid disorders: Secondary | ICD-10-CM | POA: Diagnosis not present

## 2019-07-30 DIAGNOSIS — R5383 Other fatigue: Secondary | ICD-10-CM | POA: Diagnosis not present

## 2019-07-30 DIAGNOSIS — Z01419 Encounter for gynecological examination (general) (routine) without abnormal findings: Secondary | ICD-10-CM | POA: Diagnosis not present

## 2019-08-14 DIAGNOSIS — Z13820 Encounter for screening for osteoporosis: Secondary | ICD-10-CM | POA: Diagnosis not present

## 2019-09-14 ENCOUNTER — Telehealth: Payer: Self-pay | Admitting: Infectious Diseases

## 2019-09-14 ENCOUNTER — Other Ambulatory Visit: Payer: Self-pay

## 2019-09-14 ENCOUNTER — Telehealth (INDEPENDENT_AMBULATORY_CARE_PROVIDER_SITE_OTHER): Payer: 59 | Admitting: Family Medicine

## 2019-09-14 ENCOUNTER — Other Ambulatory Visit: Payer: Self-pay | Admitting: Infectious Diseases

## 2019-09-14 DIAGNOSIS — K5100 Ulcerative (chronic) pancolitis without complications: Secondary | ICD-10-CM

## 2019-09-14 DIAGNOSIS — U071 COVID-19: Secondary | ICD-10-CM

## 2019-09-14 NOTE — Telephone Encounter (Signed)
Called to Discuss with patient about Covid symptoms and the use of the monoclonal antibody infusion for those with mild to moderate Covid symptoms and at a high risk of hospitalization.     Pt appears to qualify for this infusion due to co-morbid conditions and/or a member of an at-risk group in accordance with the FDA Emergency Use Authorization.    Unable to reach pt  LVM and MyChart sent   She has a qualifier of ulcerative colitis. Sx started Monday 8/30

## 2019-09-14 NOTE — Telephone Encounter (Signed)
Duplicate

## 2019-09-14 NOTE — Progress Notes (Signed)
I connected by phone with Joselyn Glassman on 09/14/2019 at 2:05 PM to discuss the potential use of a new treatment for mild to moderate COVID-19 viral infection in non-hospitalized patients.  This patient is a 39 y.o. female that meets the FDA criteria for Emergency Use Authorization of COVID monoclonal antibody casirivimab/imdevimab.  Has a (+) direct SARS-CoV-2 viral test result  Has mild or moderate COVID-19   Is NOT hospitalized due to COVID-19  Is within 10 days of symptom onset  Has at least one of the high risk factor(s) for progression to severe COVID-19 and/or hospitalization as defined in EUA.  Specific high risk criteria : Immunosuppressive Disease or Treatment   I have spoken and communicated the following to the patient or parent/caregiver regarding COVID monoclonal antibody treatment:  1. FDA has authorized the emergency use for the treatment of mild to moderate COVID-19 in adults and pediatric patients with positive results of direct SARS-CoV-2 viral testing who are 46 years of age and older weighing at least 40 kg, and who are at high risk for progressing to severe COVID-19 and/or hospitalization.  2. The significant known and potential risks and benefits of COVID monoclonal antibody, and the extent to which such potential risks and benefits are unknown.  3. Information on available alternative treatments and the risks and benefits of those alternatives, including clinical trials.  4. Patients treated with COVID monoclonal antibody should continue to self-isolate and use infection control measures (e.g., wear mask, isolate, social distance, avoid sharing personal items, clean and disinfect "high touch" surfaces, and frequent handwashing) according to CDC guidelines.   5. The patient or parent/caregiver has the option to accept or refuse COVID monoclonal antibody treatment.  After reviewing this information with the patient, The patient agreed to proceed with receiving  casirivimab\imdevimab infusion and will be provided a copy of the Fact sheet prior to receiving the infusion. Janene Madeira 09/14/2019 2:05 PM

## 2019-09-14 NOTE — Progress Notes (Signed)
Subjective:    Patient ID: Brianna Shaw, female    DOB: July 13, 1980, 39 y.o.   MRN: [id number]  HPI  Patient is a very pleasant 39 year old Caucasian female.  Past medical history is significant for ulcerative colitis.  She is on Lialda.  She also has a history of bilateral avascular necrosis of the hips due to steroid use for the ulcerative colitis.  Patient is currently at her home.  I am at my office.  She consents to be seen by video.  Phone call began at 1054.  Phone call concluded at 1104.  Patient began feeling poorly on Monday.  Oxygen saturations on Monday were 98%.  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  Patient had both doses of the Pfizer vaccine in January.  Tuesday she tested positive for Covid.  She denies any chest pain.  She denies any shortness of breath.  She does report body aches.  She reports fatigue.  She reports head congestion.  She reports pain behind her eyes.  She reports pressure in her nose and in her sinuses.  She denies any pleurisy or hemoptysis.  She denies any cough. Past Medical History:  Diagnosis Date  . Allergy   . Anemia   . Anorexia    [place] of[place]  . Avascular necrosis ([place])   . Colitis, ulcerative ([place])   . Lyme disease 2008  . Personal history of colonic polyps   . 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 COLONOSCOPY  multiple  . eustachian tube     placement in childhood then removed / bilat   . iron infusion    . TOTAL HIP ARTHROPLASTY Left 01/01/2014   Procedure: LEFT TOTAL HIP ARTHROPLASTY ANTERIOR APPROACH;  Surgeon: Mauri Pole, MD;  Location: [facility];  Service: Orthopedics;  Laterality: Left;  . TOTAL HIP ARTHROPLASTY Right 12/10/2014   Procedure: TOTAL RIGHT HIP ARTHROPLASTY ANTERIOR APPROACH;  Surgeon: Paralee Cancel, MD;  Location: [facility];  Service: Orthopedics;  Laterality: Right;  . wisdom teeth extrcaction     Current Outpatient  Medications on File Prior to Visit  Medication Sig Dispense Refill  . amoxicillin (AMOXIL) 500 MG capsule Take 4 pills for total 2 grams.  Take 30-60 minutes prior to dental procedure. (Patient taking differently: Take 4 pills for total 2 grams.  Take 30-60 minutes prior to dental procedure.  Because she has had bilateral hip replacements) 4 capsule 0  . Ascorbic Acid (VITAMIN C) 1000 MG tablet Take 1,000 mg by mouth daily.    . Calcium Citrate-Vitamin D (CALCIUM CITRATE + PO) Take 1 tablet by mouth every morning.    . cetirizine (ZYRTEC) 10 MG tablet Take 10 mg by mouth daily.    . Cholecalciferol (VITAMIN D) 2000 UNITS tablet Take 4,000 Units by mouth daily.    . clonazePAM (KLONOPIN) 0.5 MG tablet One once daily as needed for sleep/insomnia. 30 tablet 1  . LIALDA 1.2 g EC tablet TAKE 2 TABLETS BY MOUTH TWICE A DAY **NEEDS OFFICE VISIT FOR FURTHER REFILLS** 360 tablet 0  . medroxyPROGESTERone (DEPO-PROVERA) 150 MG/ML injection Inject 150 mg into the muscle every 3 (three) months.     No current facility-administered medications on file prior to visit.   Allergies  Allergen Reactions  . Flagyl [Metronidazole] Nausea And Vomiting    Po medication only, tolerates IV   Social History  Socioeconomic History  . Marital status: Single    Spouse name: Not on file  . Number of children: 0  . Years of education: Not on file  . Highest education level: Not on file  Occupational History  . Occupation: Programmer, multimedia: [employer]  Tobacco Use  . Smoking status: Never Smoker  . Smokeless tobacco: Never Used  Substance and Sexual Activity  . Alcohol use: Yes    Alcohol/week: 8.0 standard drinks    Types: 8 Cans of beer per week    Comment: socially   . Drug use: No  . Sexual activity: Not on file  Other Topics Concern  . 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 Difficulty of Paying Living Expenses: Not on file  Food Insecurity:   . Worried About Charity fundraiser in the Last Year: Not on file  . Ran Out of Food in the Last Year: Not on file  Transportation Needs:   . Lack of Transportation (Medical): Not on file  . Lack of Transportation (Non-Medical): Not on file  Physical Activity:   . Days of Exercise per Week: Not on file  . Minutes of Exercise per Session: Not on file  Stress:   . Feeling of Stress : Not on file  Social Connections:   . Frequency of Communication with Friends and Family: Not on file  . Frequency of Social Gatherings with Friends and Family: Not on file  . Attends Religious Services: Not on file  . Active Member of Clubs or Organizations: Not on file  . Attends Archivist Meetings: Not on file  . Marital Status: Not on file  Intimate Partner Violence:   . Fear of Current or Ex-Partner: Not on file  . Emotionally Abused: Not on file  . Physically Abused: Not on file  . Sexually Abused: Not on file     Review of Systems  All other systems reviewed and are negative.      Objective:   Physical Exam        Assessment & Plan:  [id number]  Today is day 5 of symptoms.  She has tested positive for Covid.  Given her compromised immune system due to ulcerative colitis, I have recommended Regeneron therapy.  She is already on vitamin C, vitamin D, and zinc.  Therefore we will refer the patient to the outpatient infusion clinic to receive Regeneron.  If the patient develops any shortness of breath, she is instructed to go to the emergency room.  Continue pushing fluids as she is already doing.  Patient will quarantine for 40 days.

## 2019-09-15 ENCOUNTER — Ambulatory Visit (HOSPITAL_COMMUNITY)
Admission: RE | Admit: 2019-09-15 | Discharge: 2019-09-15 | Disposition: A | Discharge status: Home / Self Care | Payer: 59 | Source: Ambulatory Visit | Attending: Pulmonary Disease | Admitting: Pulmonary Disease

## 2019-09-15 DIAGNOSIS — K5100 Ulcerative (chronic) pancolitis without complications: Secondary | ICD-10-CM | POA: Diagnosis not present

## 2019-09-15 DIAGNOSIS — U071 COVID-19: Secondary | ICD-10-CM | POA: Diagnosis not present

## 2019-09-15 MED ORDER — SODIUM CHLORIDE 0.9 % IV SOLN: INTRAVENOUS | Status: DC | PRN

## 2019-09-15 MED ORDER — DIPHENHYDRAMINE HCL 50 MG/ML IJ SOLN: 50.0000 mg | Freq: Once | INTRAMUSCULAR | Status: DC | PRN

## 2019-09-15 MED ORDER — ALBUTEROL SULFATE HFA 108 (90 BASE) MCG/ACT IN AERS: 2.0000 | INHALATION_SPRAY | Freq: Once | RESPIRATORY_TRACT | Status: DC | PRN

## 2019-09-15 MED ORDER — FAMOTIDINE IN NACL 20-0.9 MG/50ML-% IV SOLN: 20.0000 mg | Freq: Once | INTRAVENOUS | Status: DC | PRN

## 2019-09-15 MED ORDER — METHYLPREDNISOLONE SODIUM SUCC 125 MG IJ SOLR: 125.0000 mg | Freq: Once | INTRAMUSCULAR | Status: DC | PRN

## 2019-09-15 MED ORDER — EPINEPHRINE 0.3 MG/0.3ML IJ SOAJ: 0.3000 mg | Freq: Once | INTRAMUSCULAR | Status: DC | PRN

## 2019-09-15 MED ORDER — SODIUM CHLORIDE 0.9 % IV SOLN
1200.0000 mg | Freq: Once | INTRAVENOUS | Status: AC
  Administered 2019-09-15: 1200 mg via INTRAVENOUS
  Filled 2019-09-15: qty 10

## 2019-09-15 NOTE — Progress Notes (Signed)
  Diagnosis: COVID-19  Physician: Dr. Joya Gaskins  Procedure: Covid Infusion Clinic Med: casirivimab\imdevimab infusion - Provided patient with casirivimab\imdevimab fact sheet for patients, parents and caregivers prior to infusion.  Complications: No immediate complications noted.  Discharge: Discharged home   [facility] 09/15/2019

## 2019-09-15 NOTE — Discharge Instructions (Signed)

## 2019-11-15 ENCOUNTER — Telehealth: Payer: Self-pay | Admitting: Internal Medicine

## 2019-11-15 ENCOUNTER — Other Ambulatory Visit: Payer: Self-pay | Admitting: Internal Medicine

## 2019-11-15 DIAGNOSIS — K5100 Ulcerative (chronic) pancolitis without complications: Secondary | ICD-10-CM

## 2019-11-15 MED ORDER — LIALDA 1.2 G PO TBEC: DELAYED_RELEASE_TABLET | ORAL | 0 refills | Status: DC

## 2019-11-15 MED FILL — LIALDA 1.2 GM TABLET SA: 1.2 | 90 days supply | Qty: 360 | Fill #0

## 2019-11-15 NOTE — Telephone Encounter (Signed)
Needs Lialda refill per mom Ok x 3 mos but overdue for f/u so no further RF til sees me Mom aware and will tell her

## 2019-11-27 MED FILL — LIALDA 1.2 GM TABLET SA: 1.2 | 90 days supply | Qty: 360 | Fill #0

## 2019-11-29 ENCOUNTER — Other Ambulatory Visit (HOSPITAL_COMMUNITY): Payer: Self-pay

## 2019-12-26 MED FILL — POLYMYXIN B/TMP EYE DROPS: 10000-0.1 | 7 days supply | Qty: 10 | Fill #0

## 2019-12-26 MED FILL — LORazepam 1 MG TABS: 1 | 1 days supply | Qty: 4 | Fill #0

## 2020-04-24 IMAGING — US US BREAST*L* LIMITED INC AXILLA
1 series · 12 of 12 positions shown · non-contrast
Comparison: Previous exam(s).

CLINICAL DATA: 37-year-old female recalled from screening mammogram
dated 07/11/2018 for a possible left breast mass. Patient has a
family history of breast cancer diagnosed in her mother in her 40s.

EXAM:
DIGITAL DIAGNOSTIC LEFT MAMMOGRAM WITH CAD AND TOMO
ULTRASOUND LEFT BREAST

[Series 1: us breast*left* limited inc axilla · 0.06mm/px · 12 of 12 slices shown]
[im 1/12]
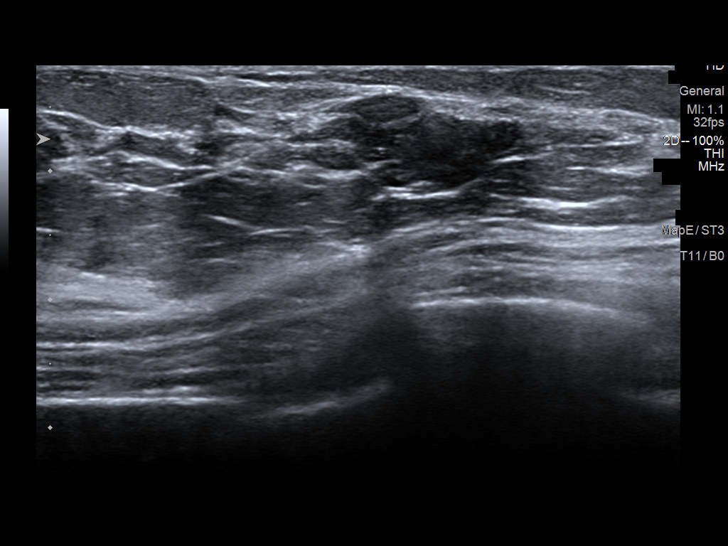
[im 2/12]
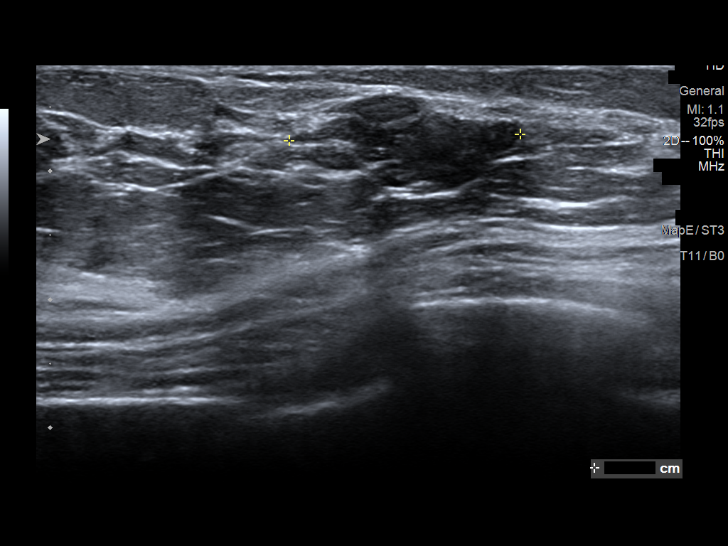
[im 3/12]
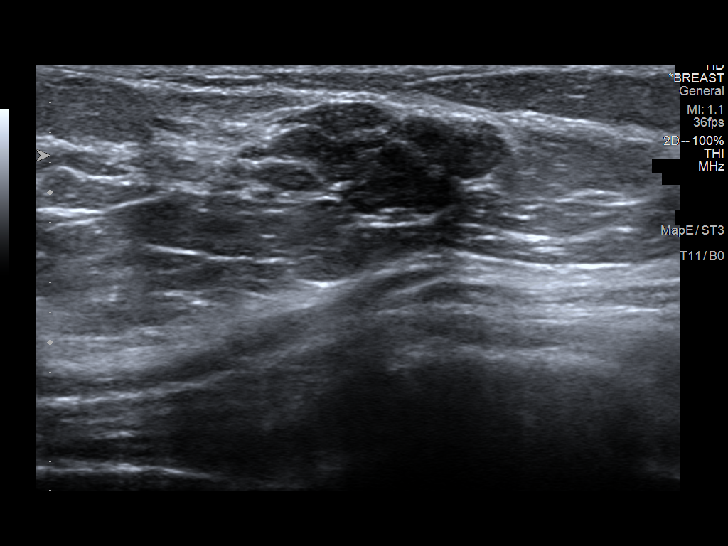
[im 4/12]
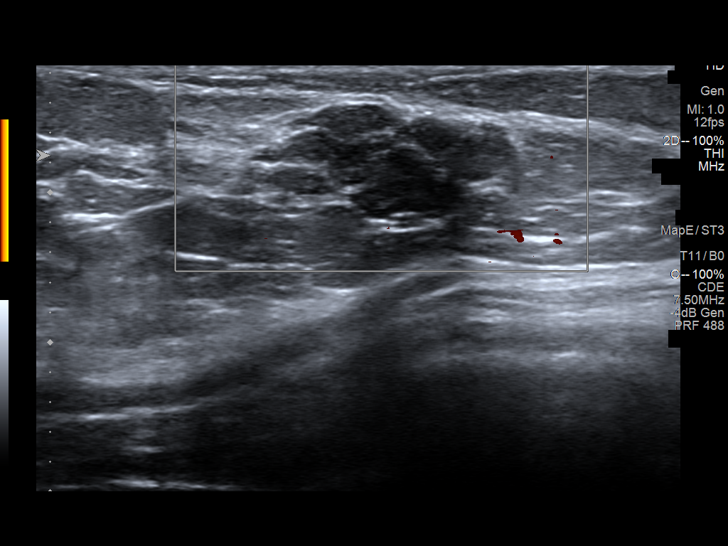
[im 5/12]
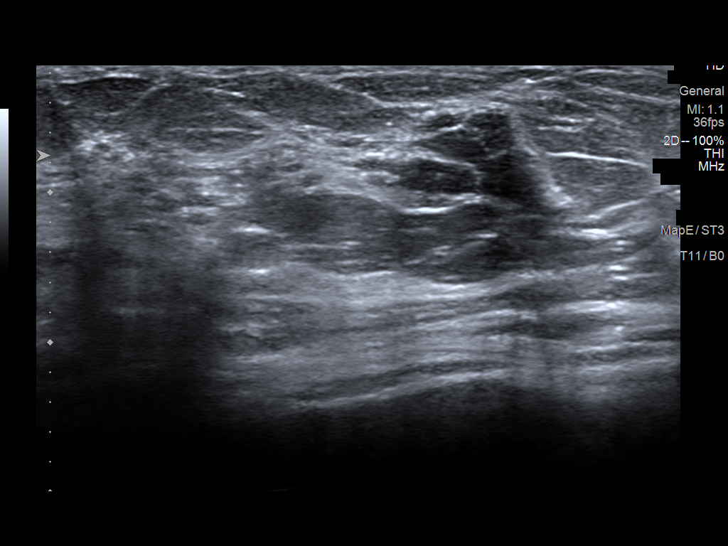
[im 6/12]
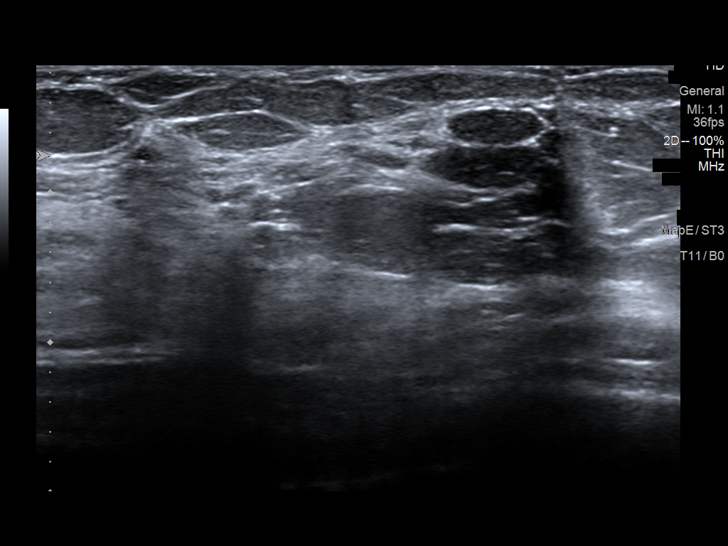
[im 7/12]
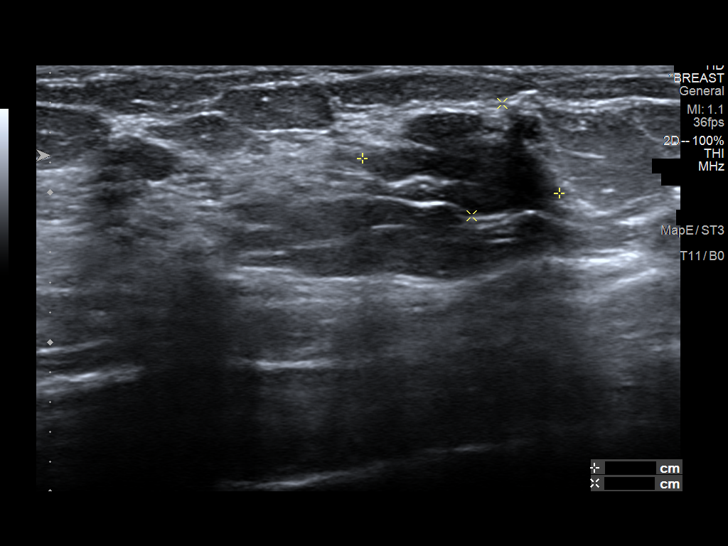
[im 8/12]
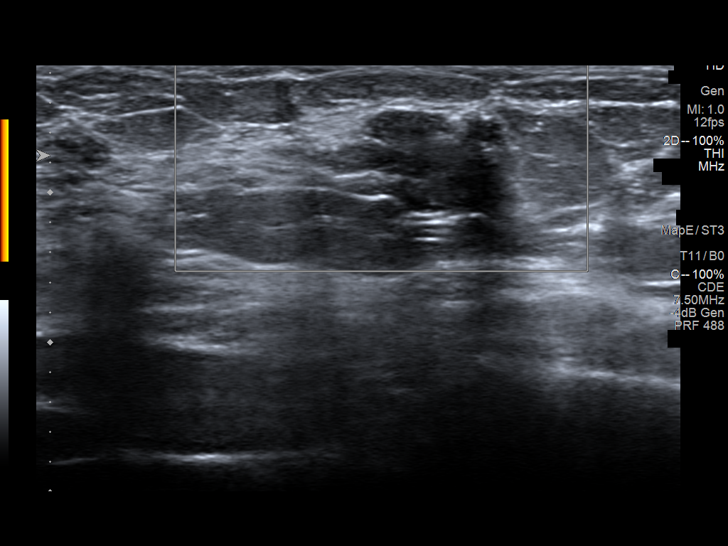
[im 9/12]
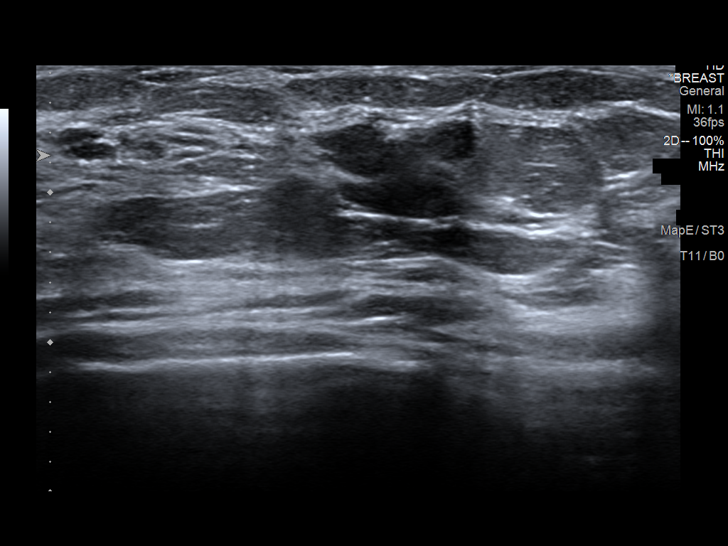
[im 10/12]
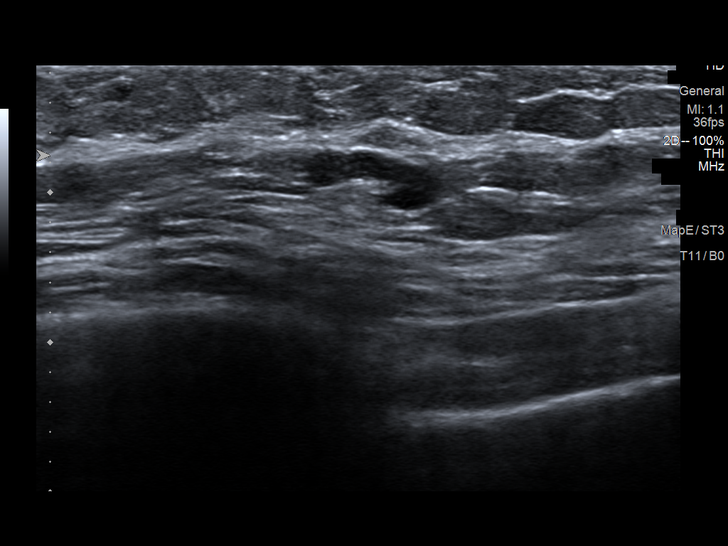
[im 11/12]
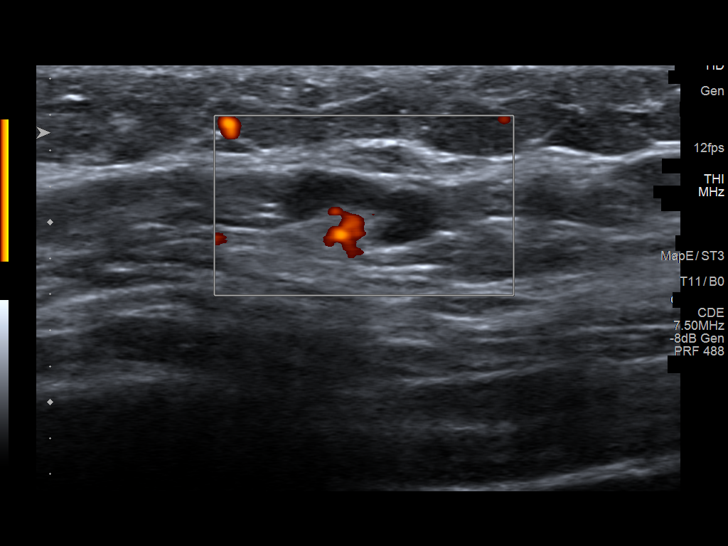
[im 12/12]
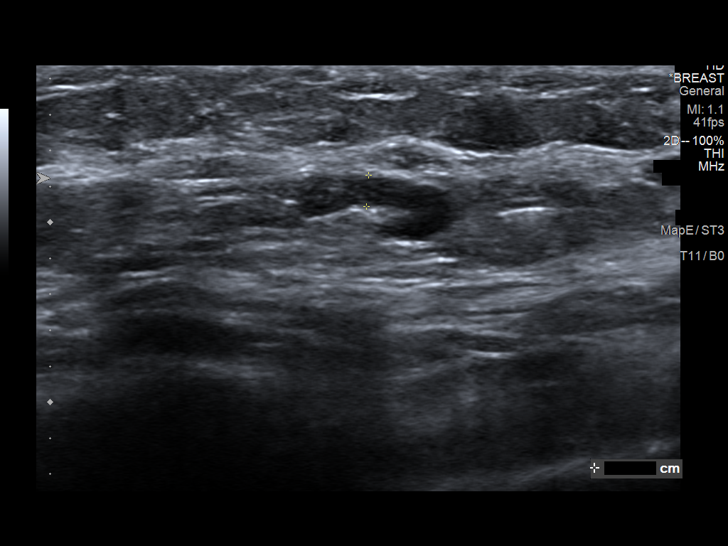

[12 of 12 positions shown; findings below may reference images not displayed]

ACR Breast Density Category c: The breast tissue is heterogeneously
dense, which may obscure small masses.
FINDINGS: There is a persistent, irregular mass with partially circumscribed
and partially obscured borders in the central lateral left breast at
middle depth. Further evaluation with ultrasound was performed.

Mammographic images were processed with CAD.

Targeted ultrasound is performed, showing an irregular hypoechoic
mass at the [DATE] position 3 cm from the nipple. It measures 1.8 x
1.3 x 0.8 cm. There is no associated vascularity. This correlates
well with the mammographic finding.

Evaluation of the left axilla demonstrates no suspicious
lymphadenopathy.
IMPRESSION: 1. Indeterminate left breast mass corresponding with the screening
mammographic findings. Recommendation is for ultrasound-guided
biopsy.
2. No suspicious left axillary lymphadenopathy.

RECOMMENDATION:
Ultrasound-guided biopsy of the left breast.

I have discussed the findings and recommendations with the patient.
Results were also provided in writing at the conclusion of the
visit. If applicable, a reminder letter will be sent to the patient
regarding the next appointment.

BI-RADS CATEGORY  4: Suspicious.

## 2020-04-24 IMAGING — MG MM CLIP PLACEMENT
4 series · 4 of 12 positions shown · non-contrast
Comparison: Previous exam(s).

CLINICAL DATA: Patient status post ultrasound-guided biopsy left
breast mass.

EXAM:
DIAGNOSTIC LEFT MAMMOGRAM POST ULTRASOUND BIOPSY

[L CC synth-2D]
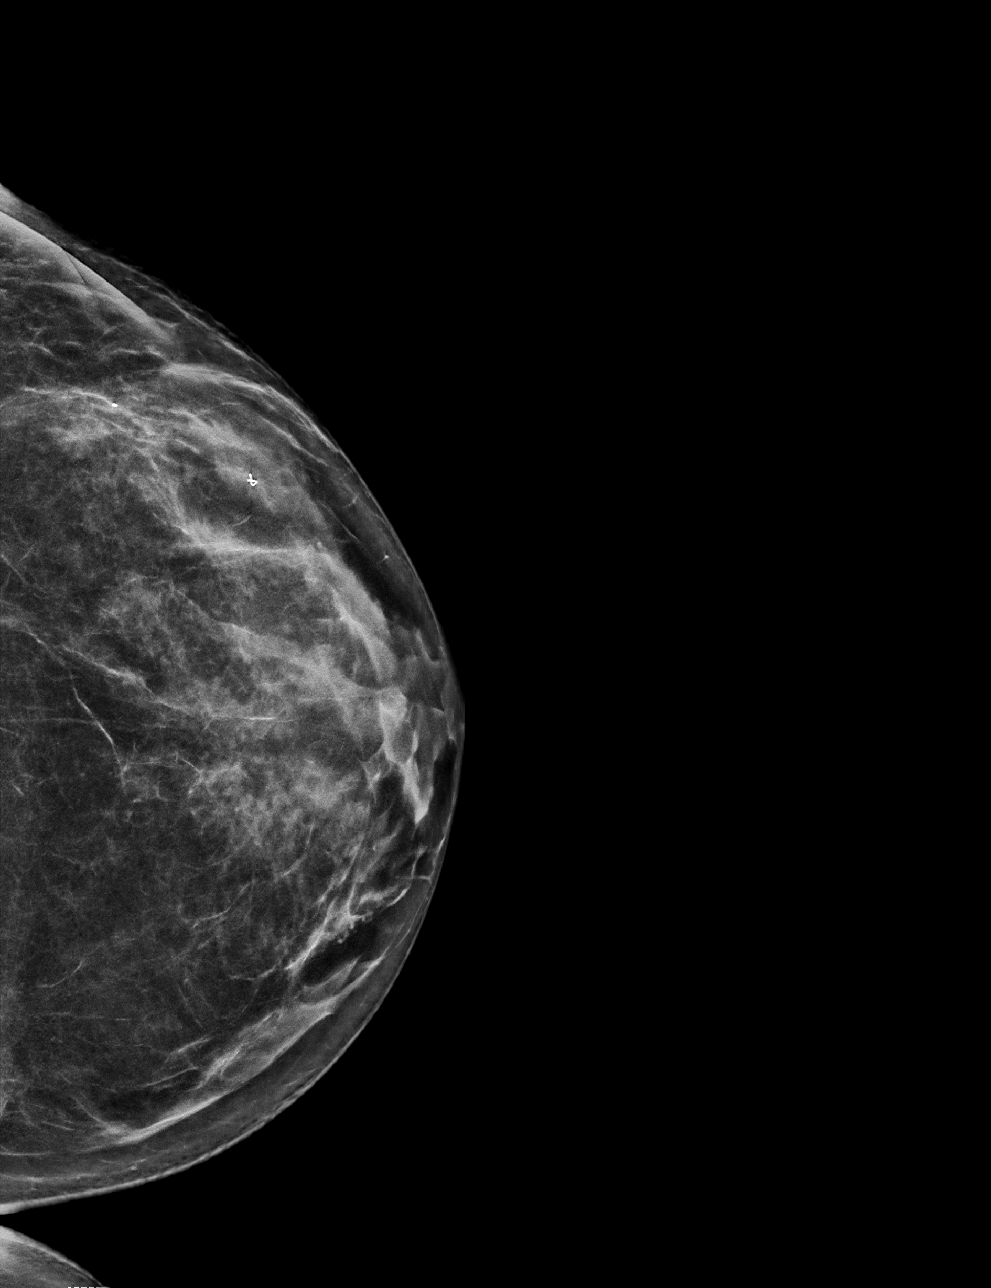

[L ML synth-2D]
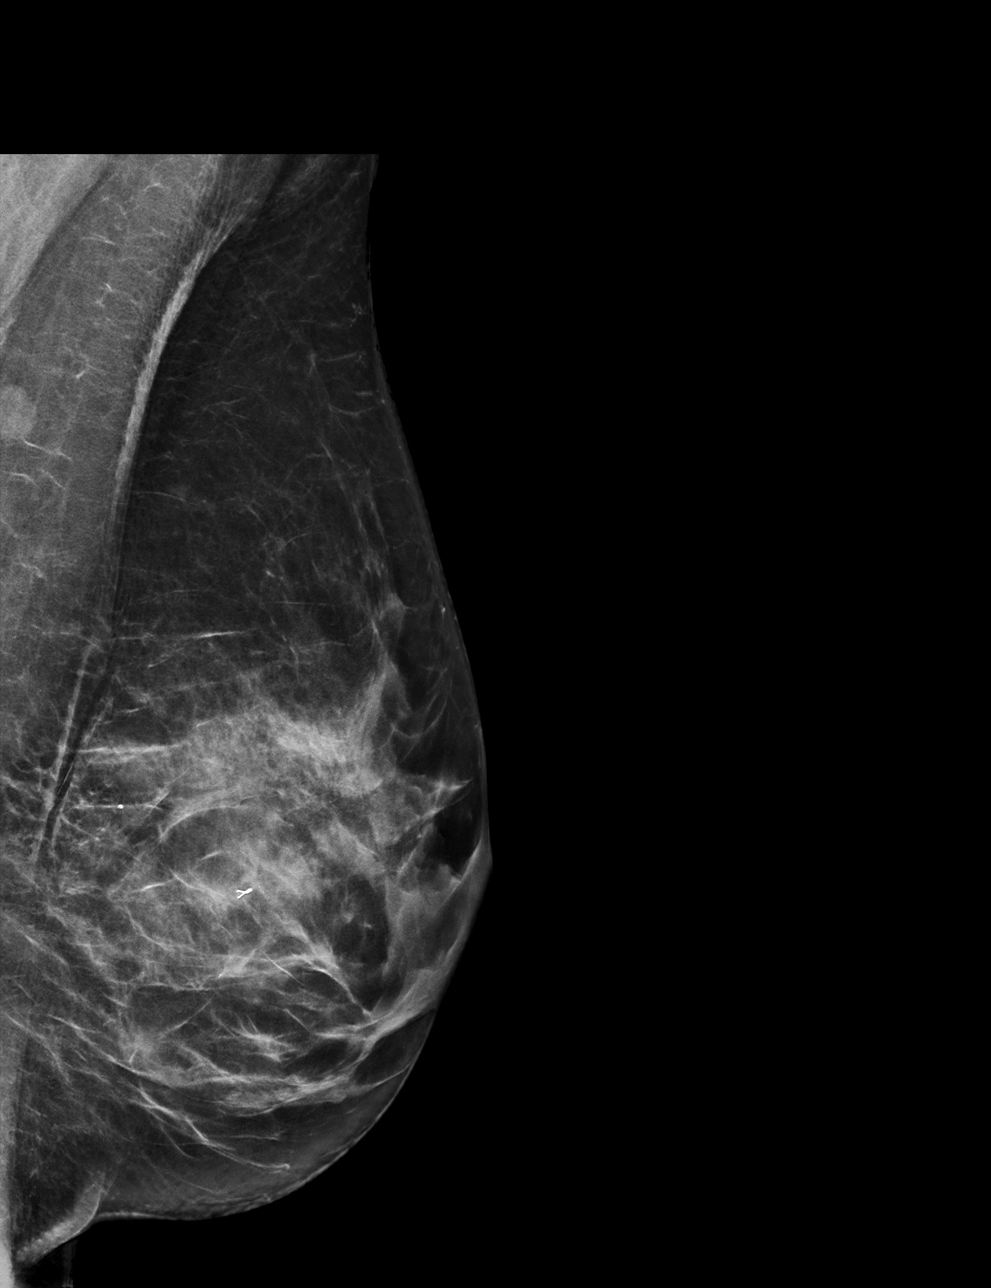

[L ML tomo · tomo slice 35/68.0]
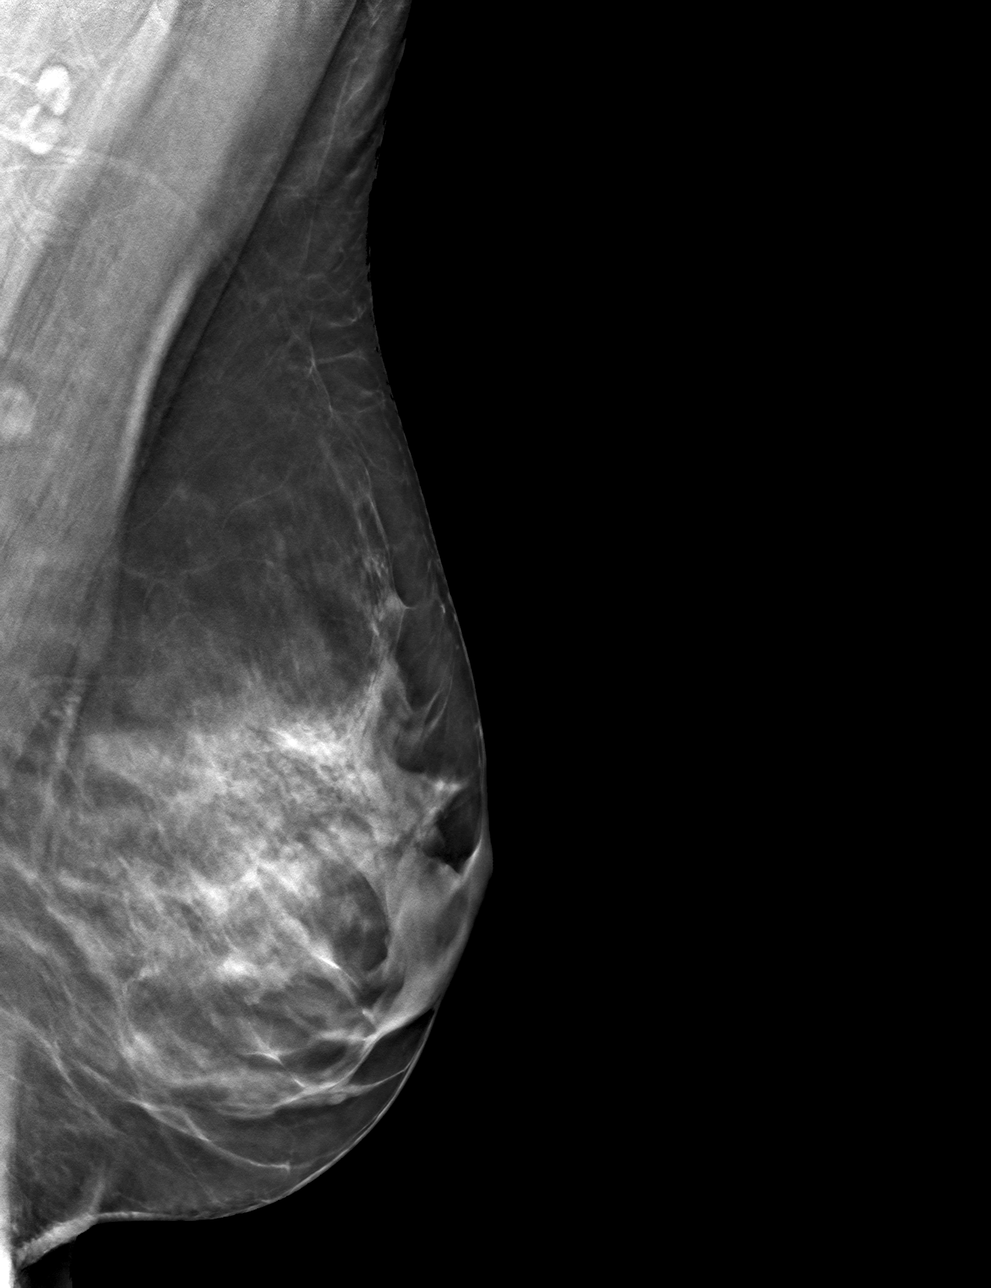

[L CC tomo · tomo slice 31/62.0]
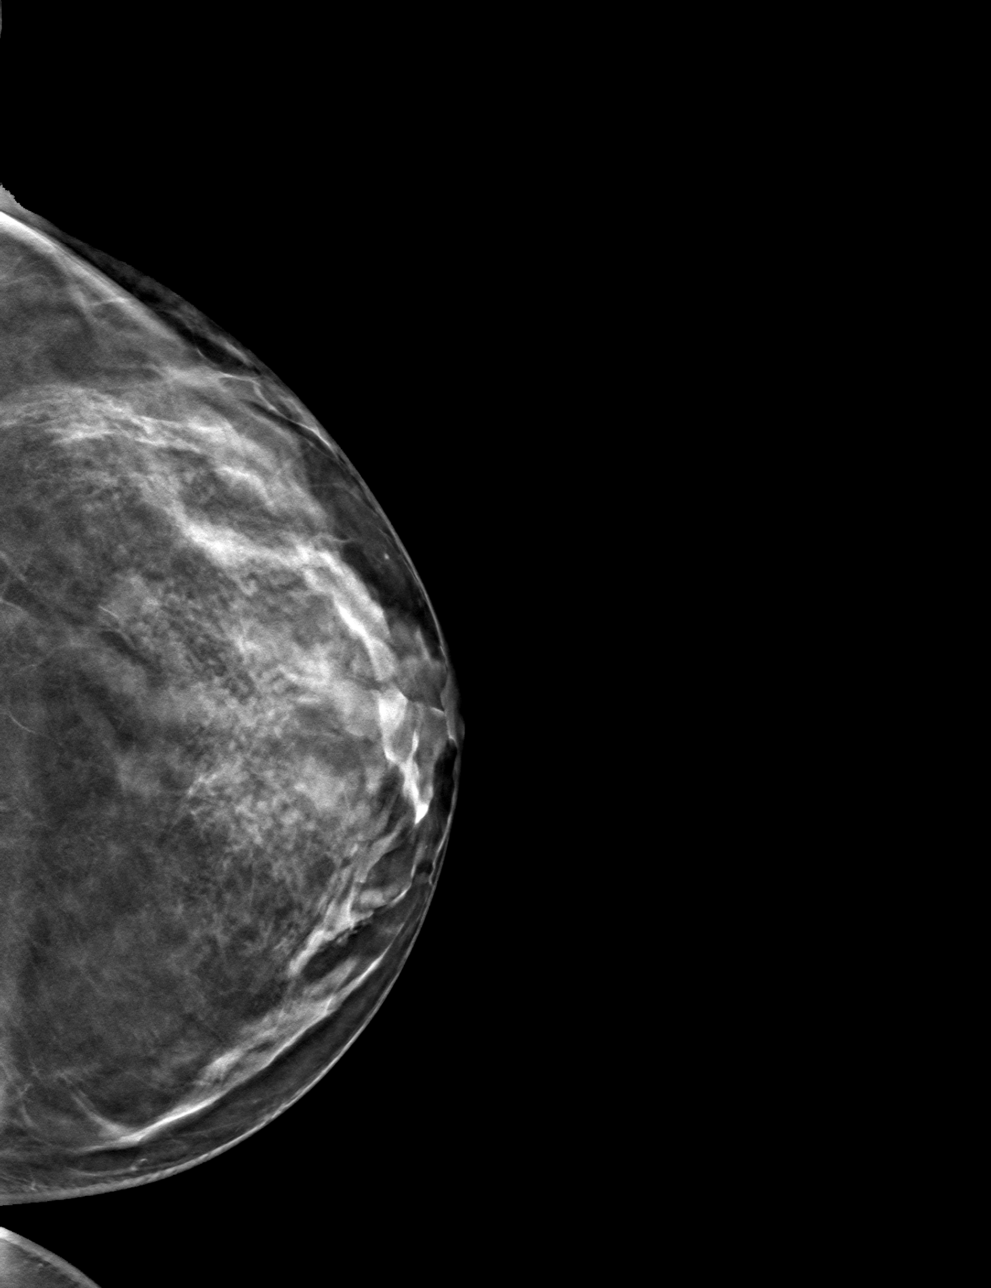

[4 of 12 positions shown; findings below may reference images not displayed]

FINDINGS: Mammographic images were obtained following ultrasound guided biopsy
of left breast mass. Ribbon shaped marking clip in appropriate
position.
IMPRESSION: Appropriate position ribbon shaped marking clip status post
ultrasound-guided biopsy left breast mass.

Final Assessment: Post Procedure Mammograms for Marker Placement

## 2020-08-04 ENCOUNTER — Other Ambulatory Visit: Payer: Self-pay

## 2020-08-04 ENCOUNTER — Encounter: Payer: Self-pay | Admitting: Podiatry

## 2020-08-04 ENCOUNTER — Ambulatory Visit (INDEPENDENT_AMBULATORY_CARE_PROVIDER_SITE_OTHER): Payer: 59

## 2020-08-04 ENCOUNTER — Ambulatory Visit (INDEPENDENT_AMBULATORY_CARE_PROVIDER_SITE_OTHER): Payer: 59 | Admitting: Podiatry

## 2020-08-04 DIAGNOSIS — M79671 Pain in right foot: Secondary | ICD-10-CM

## 2020-08-04 DIAGNOSIS — S99921A Unspecified injury of right foot, initial encounter: Secondary | ICD-10-CM

## 2020-08-04 DIAGNOSIS — S92211A Displaced fracture of cuboid bone of right foot, initial encounter for closed fracture: Secondary | ICD-10-CM

## 2020-08-04 MED ORDER — TRAMADOL HCL 50 MG PO TABS: 50.0000 mg | ORAL_TABLET | Freq: Three times a day (TID) | ORAL | 0 refills | Status: AC | PRN

## 2020-08-04 NOTE — Patient Instructions (Signed)
Walking Boot, Adult  A walking boot holds your foot or ankle in place after an injury or a medical procedure. This helps with healing and prevents further injury. It has a hard, rigid outer frame that limits movement and supports your foot and leg. The inner lining is a layer of padded material. Walking boots also have adjustablestraps to secure them over the foot and leg. A walking boot may be prescribed if you can put weight (bear weight) on your injured foot. How much you can walk while wearing the boot willdepend on the type and severity of your injury. How to put on a walking boot There are different types of walking boots. Each type has specific instructions about how to wear it properly. Follow instructions from your health care provider, such as: Ask someone to help you put on the boot, if needed. Sit to put on your boot. Doing this is more comfortable and helps to prevent falls. Open up the boot fully. Place your foot into the boot so your heel rests against the back. Your toes should be supported by the base of the boot. They should not hang over the front edge. Adjust the straps so the boot fits securely but is not too tight. Do not bend the hard frame of the boot to get a good fit. How to walk with a walking boot How much you can walk will depend on your injury. Some tips for managing with a boot include: Do not try to walk without wearing the boot unless your health care provider approves. Use other assistive walking devices, including crutches or canes, as told by your health care provider. On your uninjured foot, wear a shoe with a heel that is close to the height of the walking boot. Be careful when walking on surfaces that are uneven or wet. How to reduce swelling while using a walking boot  Rest your injured foot or leg as much as possible. If directed, put ice on the injured area. To do this: Put ice in a plastic bag. Place a towel between your skin and the bag. Leave the  ice on for 20 minutes, 2-3 times a day. Remove the ice if your skin turns bright red. This is very important. If you cannot feel pain, heat, or cold, you have a greater risk of damage to the area. Keep your injured foot or leg raised (elevated) above the level of your heart for at least 2?3 hours each day or as told by your health care provider. If swelling gets worse, loosen the boot. Rest and elevate your foot and leg. How to care for your skin and foot while using a walking boot Wear a long sock to protect your foot and leg from rubbing inside the boot. Take off the boot one time each day to check the injured area. Check your foot, the surrounding skin, and your leg to make sure there are no sores, rashes, swelling, or wounds. The skin should be a healthy color, not pale or blue. Try to notice if your walking pattern (gait) in the boot is fairly normal and that you are walking without a noticeable limp. Follow instructions from your health care provider about taking care of your incision or wound, if this applies. Clean and wash the injured area as told by your health care provider. Gently dry your foot and leg before putting the boot back on. Removing your walking boot Follow directions from your health care provider for removing the walking boot.  Generally, it is okay to remove your walking boot: When you are resting or sleeping. To clean your foot and leg. How to keep the walking boot clean Do not put any part of the boot in a washing machine or dryer. Do not use chemical cleaning products. These could irritate your skin, especially if you have a wound or an incision. Do not soak the liner of the boot. Use a washcloth with mild soap and water to clean the frame and the liner of the boot by hand. Allow the boot to air-dry completely before you put it back on your foot. Follow these instructions at home: Activity Your activity will be restricted depending on the type and severity of your  injury. Follow instructions from your health care provider. Also: Bathe and shower as told by your health care provider. Do not do any activities that could make your injury worse. Do not drive if your affected foot is the one that you use for driving. Contact a health care provider if: The boot is cracked or damaged. The boot does not fit properly. Your foot or leg hurts. You have a rash, sore, or open sore (ulcer) on your foot or leg. The skin on your foot or leg is pale. You have a wound or incision on the foot and it is getting worse. Your skin becomes painful, red, or irritated. Your swelling does not get better or it gets worse. Get help right away if: You have numbness in your foot or leg. The skin on your foot or leg is cold, blue, or gray. Summary A walking boot holds your foot or ankle in place after an injury or a medical procedure. There are different types of walking boots. Follow instructions about how to correctly wear your boot. Ask someone to help you put on the boot, if needed. It is important to check your skin and foot every day. Call your health care provider if you notice a rash or sore on your foot or leg. This information is not intended to replace advice given to you by your health care provider. Make sure you discuss any questions you have with your healthcare provider. Document Revised: 10/22/2019 Document Reviewed: 10/22/2019 Elsevier Patient Education  2022 [employer].

## 2020-08-07 NOTE — Progress Notes (Signed)
Subjective:   Patient ID: Brianna Shaw, female   DOB: 40 y.o.   MRN: [id number]   HPI 40 year old female presents the office today for same-day appointment with concerns of right foot injury.  She said that she went to a concert last night and she fell in a hole.  She had pain after she started walking and she points the lateral aspect the foot where she gets the discomfort and swelling.  She has noticed some mild bruising.  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   Review of Systems  All other systems reviewed and are negative.  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     Past Surgical History:  Procedure Laterality Date   COLONOSCOPY  multiple   eustachian tube     placement in childhood then removed / bilat    iron infusion     TOTAL HIP ARTHROPLASTY Left 01/01/2014   Procedure: LEFT TOTAL HIP ARTHROPLASTY ANTERIOR APPROACH;  Surgeon: Mauri Pole, MD;  Location: [facility];  Service: Orthopedics;  Laterality: Left;   TOTAL HIP ARTHROPLASTY Right 12/10/2014   Procedure: TOTAL RIGHT HIP ARTHROPLASTY ANTERIOR APPROACH;  Surgeon: Paralee Cancel, MD;  Location: [facility];  Service: Orthopedics;  Laterality: Right;   wisdom teeth extrcaction       Current Outpatient Medications:    clonazePAM (KLONOPIN) 0.5 MG tablet, Take 1 tablet by mouth as needed., Disp: , Rfl:    medroxyPROGESTERone Acetate 150 MG/ML SUSY, Inject 150 mg as directed as needed., Disp: , Rfl:    traMADol (ULTRAM) 50 MG tablet, Take 1 tablet (50 mg total) by mouth every 8 (eight) hours as needed for up to 5 days., Disp: 15 tablet, Rfl: 0   amoxicillin (AMOXIL) 500 MG capsule, Take 4 pills for total 2 grams.  Take 30-60 minutes prior to dental procedure. (Patient taking  differently: Take 4 pills for total 2 grams.  Take 30-60 minutes prior to dental procedure.  Because she has had bilateral hip replacements), Disp: 4 capsule, Rfl: 0   Ascorbic Acid (VITAMIN C) 1000 MG tablet, Take 1,000 mg by mouth daily., Disp: , Rfl:    Calcium Citrate-Vitamin D (CALCIUM CITRATE + PO), Take 1 tablet by mouth every morning., Disp: , Rfl:    cetirizine (ZYRTEC) 10 MG tablet, Take 10 mg by mouth daily., Disp: , Rfl:    Cholecalciferol (VITAMIN D) 2000 UNITS tablet, Take 4,000 Units by mouth daily., Disp: , Rfl:    clonazePAM (KLONOPIN) 0.5 MG tablet, One once daily as needed for sleep/insomnia., Disp: 30 tablet, Rfl: 1   LIALDA 1.2 g EC tablet, TAKE 2 TABLETS BY MOUTH TWO TIMES DAILY (NEEDS OFFICE VISIT), Disp: 360 tablet, Rfl: 0   medroxyPROGESTERone (DEPO-PROVERA) 150 MG/ML injection, Inject 150 mg into the muscle every 3 (three) months., Disp: , Rfl:    trimethoprim-polymyxin b (POLYTRIM) ophthalmic solution, INSTILL 1 DROP IN BOTH EYES 4 TIMES DAILY FOR 7 DAYS AFTER SURGERY, Disp: 10 mL, Rfl: 0  Allergies  Allergen Reactions   Flagyl [Metronidazole] Nausea And Vomiting    Po medication only, tolerates IV          Objective:  Physical  Exam  General: AAO x3, NAD  Dermatological: Skin is warm, dry and supple bilateral.  There are no open sores, no preulcerative lesions, no rash or signs of infection present.  Vascular: Dorsalis Pedis artery and Posterior Tibial artery pedal pulses are 2/4 bilateral with immedate capillary fill time. Pedal hair growth present. There is no pain with calf compression, swelling, warmth, erythema.   Neruologic: Grossly intact via light touch bilateral.   Musculoskeletal: Tenderness palpation with localized edema to the lateral aspect of the right foot mostly on the cuboid area.  There is no pain to the ankle or proximal tib-fib or to the metatarsals or digits or to the medial foot.  No ankle instability present.  No significant tenderness  in the peroneal tendon.  Muscular strength 5/5 in all groups tested bilateral.  Gait: Unassisted, Nonantalgic.       Assessment:   Cuboid fracture right foot     Plan:  -Treatment options discussed including all alternatives, risks, and complications -Etiology of symptoms were discussed -X-rays were obtained and reviewed with the patient.  Radiolucency consistent with a fracture of the cuboid.  This is nondisplaced. -Recommend immobilization in cam boot which was dispensed.  Ice elevation.  Anti-inflammatories as needed.  Return in about 3 months (around 11/04/2020) for cuboid fracture; x-ray.  Trula Slade DPM

## 2020-08-11 DIAGNOSIS — Z01411 Encounter for gynecological examination (general) (routine) with abnormal findings: Secondary | ICD-10-CM | POA: Diagnosis not present

## 2020-08-11 DIAGNOSIS — Z1329 Encounter for screening for other suspected endocrine disorder: Secondary | ICD-10-CM | POA: Diagnosis not present

## 2020-08-11 DIAGNOSIS — Z13220 Encounter for screening for lipoid disorders: Secondary | ICD-10-CM | POA: Diagnosis not present

## 2020-08-11 DIAGNOSIS — Z124 Encounter for screening for malignant neoplasm of cervix: Secondary | ICD-10-CM | POA: Diagnosis not present

## 2020-08-11 DIAGNOSIS — Z1231 Encounter for screening mammogram for malignant neoplasm of breast: Secondary | ICD-10-CM | POA: Diagnosis not present

## 2020-08-11 DIAGNOSIS — Z113 Encounter for screening for infections with a predominantly sexual mode of transmission: Secondary | ICD-10-CM | POA: Diagnosis not present

## 2020-08-11 DIAGNOSIS — Z6822 Body mass index (BMI) 22.0-22.9, adult: Secondary | ICD-10-CM | POA: Diagnosis not present

## 2020-08-11 DIAGNOSIS — Z131 Encounter for screening for diabetes mellitus: Secondary | ICD-10-CM | POA: Diagnosis not present

## 2020-08-11 DIAGNOSIS — Z01419 Encounter for gynecological examination (general) (routine) without abnormal findings: Secondary | ICD-10-CM | POA: Diagnosis not present

## 2020-08-11 DIAGNOSIS — Z0000 Encounter for general adult medical examination without abnormal findings: Secondary | ICD-10-CM | POA: Diagnosis not present

## 2020-08-11 DIAGNOSIS — R5383 Other fatigue: Secondary | ICD-10-CM | POA: Diagnosis not present

## 2020-08-14 ENCOUNTER — Telehealth: Payer: Self-pay | Admitting: Nurse Practitioner

## 2020-08-14 NOTE — Telephone Encounter (Signed)
Left message on patient's voicemail to request call back for scheduling and to notify care has been transferred to [facility].

## 2020-08-18 ENCOUNTER — Encounter: Payer: Self-pay | Admitting: Podiatry

## 2020-08-18 ENCOUNTER — Ambulatory Visit (INDEPENDENT_AMBULATORY_CARE_PROVIDER_SITE_OTHER): Payer: 59

## 2020-08-18 ENCOUNTER — Ambulatory Visit (INDEPENDENT_AMBULATORY_CARE_PROVIDER_SITE_OTHER): Payer: 59 | Admitting: Podiatry

## 2020-08-18 ENCOUNTER — Other Ambulatory Visit: Payer: Self-pay

## 2020-08-18 DIAGNOSIS — S92211D Displaced fracture of cuboid bone of right foot, subsequent encounter for fracture with routine healing: Secondary | ICD-10-CM | POA: Diagnosis not present

## 2020-08-18 DIAGNOSIS — S92211A Displaced fracture of cuboid bone of right foot, initial encounter for closed fracture: Secondary | ICD-10-CM

## 2020-08-25 NOTE — Progress Notes (Signed)
Subjective: 40 year old female presents the office today for follow evaluation of cuboid fracture right foot.  She states that she wears the boot at work but otherwise she is not wearing it.  She states that it is feeling better.  She has been icing elevate as well as use an Ace bandage.  No injuries or changes otherwise since I last saw her. Denies any systemic complaints such as fevers, chills, nausea, vomiting. No acute changes since last appointment, and no other complaints at this time.   Objective: AAO x3, NAD DP/PT pulses palpable bilaterally, CRT less than 3 seconds There is minimal tenderness along the cuboid.  Mild edema present to the area.  There is no erythema associate with edema.  Flexor, extensor tendons appear intact.  No other areas of discomfort.  No open lesions or pre-ulcerative lesions.  No pain with calf compression, swelling, warmth, erythema  Assessment: 40 year old female cuboid fracture right foot  Plan: -All treatment options discussed with the patient including all alternatives, risks, complications.  -Repeat x-rays obtained reviewed.  Increased consolidation noted.  No evidence of acute fracture otherwise. -At this point continue cam boot at work but otherwise she can transition to regular shoe as tolerated brace. -Patient encouraged to call the office with any questions, concerns, change in symptoms.   Trula Slade DPM

## 2020-09-10 DIAGNOSIS — Z20822 Contact with and (suspected) exposure to covid-19: Secondary | ICD-10-CM | POA: Diagnosis not present

## 2020-09-16 ENCOUNTER — Ambulatory Visit: Payer: 59 | Admitting: Podiatry

## 2020-12-12 ENCOUNTER — Other Ambulatory Visit (HOSPITAL_COMMUNITY): Payer: Self-pay

## 2020-12-12 ENCOUNTER — Other Ambulatory Visit: Payer: Self-pay | Admitting: Internal Medicine

## 2020-12-12 DIAGNOSIS — K5100 Ulcerative (chronic) pancolitis without complications: Secondary | ICD-10-CM

## 2020-12-15 ENCOUNTER — Ambulatory Visit
Admission: RE | Admit: 2020-12-15 | Discharge: 2020-12-15 | Disposition: A | Discharge status: Home / Self Care | Payer: 59 | Source: Ambulatory Visit | Attending: Physician Assistant | Admitting: Physician Assistant

## 2020-12-15 ENCOUNTER — Other Ambulatory Visit: Payer: Self-pay

## 2020-12-15 VITALS — BP 138/85 | HR 82 | Temp 98.0°F | Resp 16

## 2020-12-15 DIAGNOSIS — J019 Acute sinusitis, unspecified: Secondary | ICD-10-CM | POA: Diagnosis not present

## 2020-12-15 MED ORDER — AMOXICILLIN-POT CLAVULANATE 875-125 MG PO TABS: 1.0000 | ORAL_TABLET | Freq: Two times a day (BID) | ORAL | 0 refills | Status: DC

## 2020-12-15 NOTE — ED Triage Notes (Signed)
Nasal congestion for one week. Hx of sinus infections

## 2020-12-15 NOTE — ED Provider Notes (Signed)
Brianna Shaw    CSN: [id number] Arrival date & time: 12/15/20  1149      History   Chief Complaint Chief Complaint  Patient presents with   Nasal Congestion    HPI Brianna Shaw is a 40 y.o. female.   Patient here today for evaluation of nasal congestion that has persisted over the last week. She states that she has had some sinus pressure as well. She has not had any fever. She did initially have sore throat but this has resolved. She denies cough. She has tried OTC meds without resolution.   The history is provided by the patient.   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   Hx of total hip arthroplasty, left 01/01/2014   COLONIC POLYPS, ADENOMATOUS, HX OF 06/03/2009   Ulcerative colitis ([place]) 06/02/2009   Lyme disease 07/12/2006    Past Surgical History:  Procedure Laterality Date   COLONOSCOPY  multiple   eustachian tube     placement in childhood then removed / bilat    iron infusion     TOTAL HIP ARTHROPLASTY Left 01/01/2014   Procedure: LEFT TOTAL HIP ARTHROPLASTY ANTERIOR APPROACH;  Surgeon: Mauri Pole, MD;  Location: [facility];  Service: Orthopedics;  Laterality: Left;   TOTAL HIP ARTHROPLASTY Right 12/10/2014   Procedure: TOTAL RIGHT HIP ARTHROPLASTY ANTERIOR APPROACH;  Surgeon: Paralee Cancel, MD;  Location: [facility];  Service: Orthopedics;  Laterality: Right;   wisdom teeth extrcaction      OB History   No obstetric history on file.      Home Medications    Prior to Admission medications   Medication Sig  Start Date End Date Taking? Authorizing Provider  amoxicillin-clavulanate (AUGMENTIN) 875-125 MG tablet Take 1 tablet by mouth every 12 (twelve) hours. 12/15/20  Yes Francene Finders, PA-C  amoxicillin (AMOXIL) 500 MG capsule Take 4 pills for total 2 grams.  Take 30-60 minutes prior to dental procedure. Patient taking differently: Take 4 pills for total 2 grams.  Take 30-60 minutes prior to dental procedure.  Because she has had bilateral hip replacements 12/31/14   Dena Billet B, PA-C  Ascorbic Acid (VITAMIN C) 1000 MG tablet Take 1,000 mg by mouth daily.    [provider]  Calcium Citrate-Vitamin D (CALCIUM CITRATE + PO) Take 1 tablet by mouth every morning.    [provider]  cetirizine (ZYRTEC) 10 MG tablet Take 10 mg by mouth daily.    [provider]  Cholecalciferol (VITAMIN D) 2000 UNITS tablet Take 4,000 Units by mouth daily.    [provider]  clonazePAM (KLONOPIN) 0.5 MG tablet One once daily as needed for sleep/insomnia. 08/28/14   Orlena Sheldon, PA-C  clonazePAM (KLONOPIN) 0.5 MG tablet Take 1 tablet by mouth as needed. 08/28/14   [provider]  LIALDA 1.2 g EC tablet TAKE 2 TABLETS BY MOUTH TWO TIMES DAILY (NEEDS OFFICE VISIT) 11/15/19 11/14/20  Carlean Purl,  Ofilia Neas, MD  medroxyPROGESTERone (DEPO-PROVERA) 150 MG/ML injection Inject 150 mg into the muscle every 3 (three) months.    [provider]  medroxyPROGESTERone Acetate 150 MG/ML SUSY Inject 150 mg as directed as needed. 11/13/15   [provider]    Family History Family History  Problem Relation Age of Onset   Breast cancer Mother    Diabetes Maternal Grandmother    Heart disease Maternal Grandfather    Diabetes Paternal Grandfather    Colon cancer Paternal Grandmother    Esophageal cancer Neg Hx    Rectal cancer Neg Hx    Stomach cancer Neg Hx     Social History Social History   Tobacco Use   Smoking status: Never   Smokeless tobacco: Never  Substance  Use Topics   Alcohol use: Yes    Alcohol/week: 8.0 standard drinks    Types: 8 Cans of beer per week    Comment: socially    Drug use: No     Allergies   Flagyl [metronidazole]   Review of Systems Review of Systems  Constitutional:  Negative for chills and fever.  HENT:  Positive for congestion and sinus pressure. Negative for ear pain and sore throat.   Eyes:  Negative for discharge and redness.  Respiratory:  Negative for cough, shortness of breath and wheezing.   Gastrointestinal:  Negative for abdominal pain, diarrhea, nausea and vomiting.    Physical Exam Triage Vital Signs ED Triage Vitals  Enc Vitals Group     BP      Pulse      Resp      Temp      Temp src      SpO2      Weight      Height      Head Circumference      Peak Flow      Pain Score      Pain Loc      Pain Edu?      Excl. in [place]?    No data found.  Updated Vital Signs BP 138/85 (BP Location: Right Arm)   Pulse 82   Temp 98 F (36.7 C) (Oral)   Resp 16   SpO2 98%     Physical Exam Vitals and nursing note reviewed.  Constitutional:      General: She is not in acute distress.    Appearance: Normal appearance. She is not ill-appearing.  HENT:     Head: Normocephalic and atraumatic.     Nose: Congestion present.  Eyes:     Conjunctiva/sclera: Conjunctivae normal.  Cardiovascular:     Rate and Rhythm: Normal rate and regular rhythm.     Heart sounds: Normal heart sounds. No murmur heard. Pulmonary:     Effort: Pulmonary effort is normal. No respiratory distress.     Breath sounds: Normal breath sounds. No wheezing, rhonchi or rales.  Skin:    General: Skin is warm and dry.  Neurological:     Mental Status: She is alert.  Psychiatric:        Mood and Affect: Mood normal.        Thought Content: Thought content normal.     UC Treatments / Results  Labs (all labs ordered are listed, but only abnormal results are displayed) Labs Reviewed - No data to  display  EKG   Radiology No results found.  Procedures Procedures (including critical Shaw time)  Medications Ordered in UC Medications - No data to  display  Initial Impression / Assessment and Plan / UC Course  I have reviewed the triage vital signs and the nursing notes.  Pertinent labs & imaging results that were available during my Shaw of the patient were reviewed by me and considered in my medical decision making (see chart for details).    Augmentin prescribed for suspected sinusitis. Recommend follow up with any further concerns.   Final Clinical Impressions(s) / UC Diagnoses   Final diagnoses:  Acute sinusitis, recurrence not specified, unspecified location   Discharge Instructions   None    ED Prescriptions     Medication Sig Dispense Auth. Provider   amoxicillin-clavulanate (AUGMENTIN) 875-125 MG tablet Take 1 tablet by mouth every 12 (twelve) hours. 14 tablet Francene Finders, PA-C      PDMP not reviewed this encounter.   Francene Finders, PA-C 12/15/20 1352

## 2021-02-13 ENCOUNTER — Ambulatory Visit (INDEPENDENT_AMBULATORY_CARE_PROVIDER_SITE_OTHER): Payer: 59 | Admitting: Psychology

## 2021-02-13 DIAGNOSIS — F4322 Adjustment disorder with anxiety: Secondary | ICD-10-CM | POA: Diagnosis not present
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
## 2021-02-14 ENCOUNTER — Other Ambulatory Visit: Payer: Self-pay | Admitting: Internal Medicine

## 2021-02-14 DIAGNOSIS — K5100 Ulcerative (chronic) pancolitis without complications: Secondary | ICD-10-CM

## 2021-02-16 ENCOUNTER — Other Ambulatory Visit (HOSPITAL_COMMUNITY): Payer: Self-pay

## 2021-02-25 ENCOUNTER — Ambulatory Visit (INDEPENDENT_AMBULATORY_CARE_PROVIDER_SITE_OTHER): Payer: 59 | Admitting: Psychology

## 2021-02-25 DIAGNOSIS — F4322 Adjustment disorder with anxiety: Secondary | ICD-10-CM

## 2021-02-25 NOTE — Progress Notes (Signed)
[facility] Counselor/Therapist Progress Note  Patient ID: Brianna Shaw, MRN: [id number],    Date: 02/25/2021  Time Spent: 50 mins  Treatment Type: Individual Therapy  Reported Symptoms: Pt presented for today's session, via webex video, due to the virus outbreak.  Pt granted consent for the session, stating she is in her home with no one else present.  I shared with pt that I am in my office at home with no one else here.  Mental Status Exam: Appearance:  Casual     Behavior: Appropriate  Motor: Normal  Speech/Language:  Clear and Coherent  Affect: Appropriate  Mood: normal  Thought process: normal  Thought content:   WNL  Sensory/Perceptual disturbances:   WNL  Orientation: oriented to person, place, and time/date  Attention: Good  Concentration: Good  Memory: WNL  Fund of knowledge:  Good  Insight:   Good  Judgment:  Good  Impulse Control: Good   Risk Assessment: Danger to Self:  No Self-injurious Behavior: No Danger to Others: No Duty to Warn:no Physical Aggression / Violence:No  Access to Firearms a concern: No  Gang Involvement:No   Subjective: Pt shares that her step dad has told her mom to be nice to pt and she has been nicer to her and pt has been trying to reciprocate.  Pt went to visit her niece in [place] last week.  She told pt that her biological father has fallen lately and he is not doing well.  Pt does not remember her mom being married to him.  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  Pt shares that her mom has been married 5 times and that has impacted pt in a variety of ways.  Talked with pt about how we all learn how to be adults by watching the adults who raise [place].  She has learned some good lessons in her life, some of them she has learned from her family.  We talked about the benefits of self care activities; encouraged pt to think about what she would like to be her self care activities  moving forward.  Pt is travelling tomorrow to [facility] for a music festival in [place] and to visit her brother in [place].  We will meet in 2 wks for a follow up session.  Interventions: Cognitive Behavioral Therapy  Diagnosis:Adjustment disorder with anxious mood  Plan: Treatment Plan Strengths/Abilities:  Intelligent, Intuitive, Willing to participate in therapy Treatment Preferences:  Outpatient Individual Therapy Statement of Needs:  Patient is to use CBT, mindfulness and coping skills to help manage and/or decrease symptoms associated with their diagnosis. Symptoms:  Depressed/Irritable mood, worry, social withdrawal Problems Addressed:  Depressive thoughts, Sadness, Sleep issues, etc. Long Term Goals:  Pt to reduce overall level, frequency, and intensity of the feelings of anxiety as evidenced by decreased irritability, negative self talk, and helpless feelings from 6 to 7 days/week to 0 to 1 days/week, per client report, for at least 3 consecutive months.  Progress: 10% Short Term Goals:  Pt to verbally express understanding of the relationship between feelings of anxiety and their impact on thinking patterns and behaviors.  Pt to verbalize an understanding of the role that distorted thinking plays in creating fears, excessive worry, and ruminations.  Progress: 10% Target Date:  02/25/2022 Frequency:  Bi-weekly Modality:  Cognitive Behavioral Therapy Interventions by Therapist:  Therapist will use CBT, Mindfulness exercises, Coping skills and Referrals, as needed by client. Client has  verbally approved this treatment plan.  Ivan Anchors, [facility]

## 2021-03-11 ENCOUNTER — Ambulatory Visit (INDEPENDENT_AMBULATORY_CARE_PROVIDER_SITE_OTHER): Payer: 59 | Admitting: Psychology

## 2021-03-11 DIAGNOSIS — F4322 Adjustment disorder with anxiety: Secondary | ICD-10-CM

## 2021-03-11 NOTE — Progress Notes (Signed)
[facility] Counselor/Therapist Progress Note ? ?Patient ID: Brianna Shaw, MRN: [id number],   ? ?Date: 03/11/2021 ? ?Time Spent: 50 mins ? ?Treatment Type: Individual Therapy ? ?Reported Symptoms: Pt presented for today's session, via webex video, due to the virus outbreak.  Pt granted consent for the session, stating she is in her home with no one else present.  I shared with pt that I am in my office at home with no one else here. ? ?Mental Status Exam: ?Appearance:  Casual     ?Behavior: Appropriate  ?Motor: Normal  ?Speech/Language:  Clear and Coherent  ?Affect: Appropriate  ?Mood: normal  ?Thought process: normal  ?Thought content:   WNL  ?Sensory/Perceptual disturbances:   WNL  ?Orientation: oriented to person, place, and time/date  ?Attention: Good  ?Concentration: Good  ?Memory: WNL  ?Fund of knowledge:  Good  ?Insight:   Good  ?Judgment:  Good  ?Impulse Control: Good  ? ?Risk Assessment: ?Danger to Self:  No ?Self-injurious Behavior: No ?Danger to Others: No ?Duty to Warn:no ?Physical Aggression / Violence:No  ?Access to Firearms a concern: No  ?Gang Involvement:No  ? ?Subjective: Pt shares that her trip to [facility] to the music festival was a lot of fun.  She also went to a concert in [place] and reconnected with some friends she knows from town.  She is reconnecting with lots of concert going friends.  She also visited with her brother (half brother-same dad) in [place] and he was not really upset about his dad being sick.  Pt shares that her mom has been nicer to her of late; she has cooked dinner for pt a couple of times recently.  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  Pt shares that she has trouble setting limits with others who she enjoys going to concerts with; "I live my life at 100 mph and then I just can't keep up with these younger people."  Pt talks a little bit  about a former boyfriend, Audry Pili; they have been off and on for 7 yrs.  "I am not good at setting boundaries for myself."  Pt shares she would like to start exercising again, now that Spring is almost here.  Encouraged pt to be as intentional as she can be about doing things that are good for her and we will meet in 2 wks for a follow up session. ? ?Interventions: Cognitive Behavioral Therapy ? ?Diagnosis:Adjustment disorder with anxious mood ? ?Plan: Treatment Plan ?Strengths/Abilities:  Intelligent, Intuitive, Willing to participate in therapy ?Treatment Preferences:  Outpatient Individual Therapy ?Statement of Needs:  Patient is to use CBT, mindfulness and coping skills to help manage and/or decrease symptoms associated with their diagnosis. ?Symptoms:  Depressed/Irritable mood, worry, social withdrawal ?Problems Addressed:  Depressive thoughts, Sadness, Sleep issues, etc. ?Long Term Goals:  Pt to reduce overall level, frequency, and intensity of the feelings of anxiety as evidenced by decreased irritability, negative self talk, and helpless feelings from 6 to 7 days/week to 0 to 1 days/week, per client report, for at least 3 consecutive months.  Progress: 10% ?Short Term Goals:  Pt to verbally express understanding of the relationship between feelings of anxiety and their impact on thinking patterns and behaviors.  Pt to verbalize an understanding of the role that distorted thinking plays in creating fears, excessive worry, and ruminations.  Progress: 10% ?Target Date:  02/25/2022 ?Frequency:  Bi-weekly ?Modality:  Cognitive Behavioral Therapy ?Interventions by Therapist:  Therapist will use CBT, Mindfulness exercises, Coping skills and Referrals, as needed by client. ?Client has verbally approved this treatment plan. ? ?Ivan Anchors, [facility] ?

## 2021-03-27 ENCOUNTER — Ambulatory Visit (INDEPENDENT_AMBULATORY_CARE_PROVIDER_SITE_OTHER): Payer: 59 | Admitting: Psychology

## 2021-03-27 DIAGNOSIS — F4322 Adjustment disorder with anxiety: Secondary | ICD-10-CM | POA: Diagnosis not present

## 2021-03-27 NOTE — Progress Notes (Signed)
[facility] Counselor/Therapist Progress Note ? ?Patient ID: Brianna Shaw, MRN: [id number],   ? ?Date: 03/27/2021 ? ?Time Spent: 50 mins ? ?Treatment Type: Individual Therapy ? ?Reported Symptoms: Pt presented for today's session, via webex video, due to the virus outbreak.  Pt granted consent for the session, stating she is in her home with no one else present.  I shared with pt that I am in my office at home with no one else here. ? ?Mental Status Exam: ?Appearance:  Casual     ?Behavior: Appropriate  ?Motor: Normal  ?Speech/Language:  Clear and Coherent  ?Affect: Appropriate  ?Mood: normal  ?Thought process: normal  ?Thought content:   WNL  ?Sensory/Perceptual disturbances:   WNL  ?Orientation: oriented to person, place, and time/date  ?Attention: Good  ?Concentration: Good  ?Memory: WNL  ?Fund of knowledge:  Good  ?Insight:   Good  ?Judgment:  Good  ?Impulse Control: Good  ? ?Risk Assessment: ?Danger to Self:  No ?Self-injurious Behavior: No ?Danger to Others: No ?Duty to Warn:no ?Physical Aggression / Violence:No  ?Access to Firearms a concern: No  ?Gang Involvement:No  ? ?Subjective: Pt shares that she went to [place], [place] last week to see friends and she had a great time.  She shares that she has been having trouble sleeping recently.  She has tried Trazodone and Ambien and those do not work so well.  She tries to keep her going to bed routine the same whether she is trying to go to bed in the morning after work or she is going to bed at a regular time at night.  Talked pt through the process of deep breathing and encouraged pt to practice daily to get the skill down and to use it as she is trying to fall asleep.  She shares that she has a friend in [place] who lost his dad after a 4 yr fight with cancer.  Pt shares that she and Gerald Stabs have talked about their relationship and pt feels they are in a good place now.  We talked about how she interacts with guys and how it is hard for guys to keep  good boundaries with her.  She talks with me about Phillip Heal who is another guy she goes to shows with.  She is able to see visible changes of emotion in him when there are other guys around.  Boundaries are clearly an issue for pt and we need to keep talking about those.  Encouraged pt to practice deep breathing in an effort to help her fall asleep more effectively and we will meet in 2 wks for a follow up session.  ? ?Interventions: Cognitive Behavioral Therapy ? ?Diagnosis:Adjustment disorder with anxious mood ? ?Plan: Treatment Plan ?Strengths/Abilities:  Intelligent, Intuitive, Willing to participate in therapy ?Treatment Preferences:  Outpatient Individual Therapy ?Statement of Needs:  Patient is to use CBT, mindfulness and coping skills to help manage and/or decrease symptoms associated with their diagnosis. ?Symptoms:  Depressed/Irritable mood, worry, social withdrawal ?Problems Addressed:  Depressive thoughts, Sadness, Sleep issues, etc. ?Long Term Goals:  Pt to reduce overall level, frequency, and intensity of the feelings of anxiety as evidenced by decreased irritability, negative self talk, and helpless feelings from 6 to 7 days/week to 0 to 1 days/week, per client report, for at least 3 consecutive months.  Progress: 10% ?Short Term Goals:  Pt to verbally express understanding of the relationship between feelings of anxiety and their impact on thinking patterns and behaviors.  Pt  to verbalize an understanding of the role that distorted thinking plays in creating fears, excessive worry, and ruminations.  Progress: 10% ?Target Date:  02/25/2022 ?Frequency:  Bi-weekly ?Modality:  Cognitive Behavioral Therapy ?Interventions by Therapist:  Therapist will use CBT, Mindfulness exercises, Coping skills and Referrals, as needed by client. ?Client has verbally approved this treatment plan. ? ?Ivan Anchors, [facility] ?

## 2021-04-09 ENCOUNTER — Ambulatory Visit (INDEPENDENT_AMBULATORY_CARE_PROVIDER_SITE_OTHER): Payer: 59 | Admitting: Psychology

## 2021-04-09 DIAGNOSIS — F4322 Adjustment disorder with anxiety: Secondary | ICD-10-CM

## 2021-04-09 NOTE — Progress Notes (Signed)
[facility] Counselor/Therapist Progress Note ? ?Patient ID: Brianna Shaw, MRN: [id number],   ? ?Date: 04/09/2021 ? ?Time Spent: 50 mins ? ?Treatment Type: Individual Therapy ? ?Reported Symptoms: Pt presented for today's session, via webex video, due to the virus outbreak.  Pt granted consent for the session, stating she is in her home with no one else present.  I shared with pt that I am in my office at home with no one else here. ? ?Mental Status Exam: ?Appearance:  Casual     ?Behavior: Appropriate  ?Motor: Normal  ?Speech/Language:  Clear and Coherent  ?Affect: Appropriate  ?Mood: normal  ?Thought process: normal  ?Thought content:   WNL  ?Sensory/Perceptual disturbances:   WNL  ?Orientation: oriented to person, place, and time/date  ?Attention: Good  ?Concentration: Good  ?Memory: WNL  ?Fund of knowledge:  Good  ?Insight:   Good  ?Judgment:  Good  ?Impulse Control: Good  ? ?Risk Assessment: ?Danger to Self:  No ?Self-injurious Behavior: No ?Danger to Others: No ?Duty to Warn:no ?Physical Aggression / Violence:No  ?Access to Firearms a concern: No  ?Gang Involvement:No  ? ?Subjective: Pt shares that she did not sleep last night because she had worked the last weekend, three nights in a row.  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  Pt is going to [place] for the funeral of the father of a friend and for a concert this weekend.  She also has three things to do on Sunday with friends too in [place].  Pt continues to interact with lots of friends and she enjoys that.  She is buying a new vinyl record at 1pm today.  She has 1654 records at this point.  She enjoys listening to music as a self care activity.  Encouraged pt to continue with her self care activities and we will meet in 3 wks for a follow up session, due to her travelling.  ? ?Interventions: Cognitive  Behavioral Therapy ? ?Diagnosis:Adjustment disorder with anxious mood ? ?Plan: Treatment Plan ?Strengths/Abilities:  Intelligent, Intuitive, Willing to participate in therapy ?Treatment Preferences:  Outpatient Individual Therapy ?Statement of Needs:  Patient is to use CBT, mindfulness and coping skills to help manage and/or decrease symptoms associated with their diagnosis. ?Symptoms:  Depressed/Irritable mood, worry, social withdrawal ?Problems Addressed:  Depressive thoughts, Sadness, Sleep issues, etc. ?Long Term Goals:  Pt to reduce overall level, frequency, and intensity of the feelings of anxiety as evidenced by decreased irritability, negative self talk, and helpless feelings from 6 to 7 days/week to 0 to 1 days/week, per client report, for at least 3 consecutive months.  Progress: 10% ?Short Term Goals:  Pt to verbally express understanding of the relationship between feelings of anxiety and their impact on thinking patterns and behaviors.  Pt to verbalize an understanding of the role that distorted thinking plays in creating fears, excessive worry, and ruminations.  Progress: 10% ?Target Date:  02/25/2022 ?Frequency:  Bi-weekly ?Modality:  Cognitive Behavioral Therapy ?Interventions by Therapist:  Therapist will use CBT, Mindfulness exercises, Coping skills and Referrals, as needed by client. ?Client has verbally approved this treatment plan. ? ?Ivan Anchors, [facility] ?

## 2021-04-16 ENCOUNTER — Ambulatory Visit: Payer: 59 | Admitting: Nurse Practitioner

## 2021-04-16 ENCOUNTER — Encounter: Payer: Self-pay | Admitting: Nurse Practitioner

## 2021-04-16 VITALS — BP 95/66 | HR 111 | Temp 98.1°F | Ht 64.96 in | Wt 133.3 lb

## 2021-04-16 DIAGNOSIS — Z7689 Persons encountering health services in other specified circumstances: Secondary | ICD-10-CM

## 2021-04-16 DIAGNOSIS — F411 Generalized anxiety disorder: Secondary | ICD-10-CM | POA: Insufficient documentation

## 2021-04-16 DIAGNOSIS — G4726 Circadian rhythm sleep disorder, shift work type: Secondary | ICD-10-CM | POA: Diagnosis not present

## 2021-04-16 MED ORDER — CLONAZEPAM 0.5 MG PO TABS: ORAL_TABLET | ORAL | 2 refills | Status: DC

## 2021-04-16 NOTE — Progress Notes (Signed)
? ?New Patient Office Visit ? ?Subjective:  ?Patient ID: Brianna Shaw, female    DOB: 06/22/1980  Age: 41 y.o. MRN: [id number] ? ?CC:  ?Chief Complaint  ?Patient presents with  ? New Patient (Initial Visit)  ? ? ?HPI ?Brianna Shaw presents to establish new primary care provider.  ?-patient is a night shift Therapist, sports for [employer]. ?-has trouble with sleep. Has been worse this past few months. Has tried multiple OTC medications including melatonin, diphenhydramine, "sleep well" supplements, and unisom. She has also tried many coping mechanisms to help. Has black out curtains and white noise machine. She has tried meditation and deep breathing exercises. She states that all of this helps very little. In the past, she has been given trial of ambien and it caused her to have severe nightmares. She has tried low dose clonazepam which seems to work the best. Helps to shut down the racing thoughts and anxiety and allows her to sleep for a few hours.   ?-seeing a therapist to help her manage anxiety.  ?-sees GYN provider for well woman care. Last visit was in August. She did have labs done at most recent visit.  ? ?Past Medical History:  ?Diagnosis Date  ? Allergy   ? Anemia   ? Anorexia   ? history of/received inpt treatment in [place]  ? Avascular necrosis ([place])   ? Colitis, ulcerative ([place])   ? Lyme disease 2008  ? Personal history of colonic polyps   ? Sleep disorder   ? secondary to night shift work   ? ? ?Past Surgical History:  ?Procedure Laterality Date  ? COLONOSCOPY  multiple  ? eustachian tube    ? placement in childhood then removed / bilat   ? iron infusion    ? TOTAL HIP ARTHROPLASTY Left 01/01/2014  ? Procedure: LEFT TOTAL HIP ARTHROPLASTY ANTERIOR APPROACH;  Surgeon: Mauri Pole, MD;  Location: [facility];  Service: Orthopedics;  Laterality: Left;  ? TOTAL HIP ARTHROPLASTY Right 12/10/2014  ? Procedure: TOTAL RIGHT HIP ARTHROPLASTY ANTERIOR APPROACH;  Surgeon: Paralee Cancel, MD;  Location: [facility];   Service: Orthopedics;  Laterality: Right;  ? wisdom teeth extrcaction    ? ? ?Family History  ?Problem Relation Age of Onset  ? Breast cancer Mother   ? Diabetes Maternal Grandmother   ? Heart disease Maternal Grandfather   ? Diabetes Paternal Grandfather   ? Colon cancer Paternal Grandmother   ? Esophageal cancer Neg Hx   ? Rectal cancer Neg Hx   ? Stomach cancer Neg Hx   ? ? ?Social History  ? ?Socioeconomic History  ? Marital status: Single  ?  Spouse name: Not on file  ? Number of children: 0  ? Years of education: Not on file  ? Highest education level: Not on file  ?Occupational History  ? Occupation: Therapist, sports  ?  Employer: [employer]  ?Tobacco Use  ? Smoking status: Never  ? Smokeless tobacco: Never  ?Substance and Sexual Activity  ? Alcohol use: Yes  ?  Alcohol/week: 8.0 standard drinks  ?  Types: 8 Cans of beer per week  ?  Comment: socially   ? Drug use: No  ? Sexual activity: Not on file  ?Other Topics Concern  ? Not on file  ?Social History Narrative  ? Single, children, she is a Marine scientist at [employer]  ? Lives in [place]  ? Alcohol approximately 1 a day, never smoker no drug  use  ? ?Social Determinants of Health  ? ?Financial Resource Strain: Not on file  ?Food Insecurity: Not on file  ?Transportation Needs: Not on file  ?Physical Activity: Not on file  ?Stress: Not on file  ?Social Connections: Not on file  ?Intimate Partner Violence: Not on file  ? ? ?ROS ?Review of Systems  ?Constitutional:  Positive for fatigue. Negative for activity change, appetite change, chills and fever.  ?HENT:  Negative for congestion, postnasal drip, rhinorrhea, sinus pressure, sinus pain, sneezing and sore throat.   ?Eyes: Negative.   ?Respiratory:  Negative for cough, chest tightness, shortness of breath and wheezing.   ?Cardiovascular:  Negative for chest pain and palpitations.  ?Gastrointestinal:  Negative for abdominal pain, constipation, diarrhea, nausea and vomiting.  ?Endocrine: Negative for cold  intolerance, heat intolerance, polydipsia and polyuria.  ?Genitourinary:  Negative for dyspareunia, dysuria, flank pain, frequency and urgency.  ?Musculoskeletal:  Negative for arthralgias, back pain and myalgias.  ?Skin:  Negative for rash.  ?Allergic/Immunologic: Negative for environmental allergies.  ?Neurological:  Negative for dizziness, weakness and headaches.  ?Hematological:  Negative for adenopathy.  ?Psychiatric/Behavioral:  The patient is nervous/anxious.   ? ?Objective:  ? ?Today's Vitals  ? 04/16/21 1107  ?BP: 95/66  ?Pulse: (!) 111  ?Temp: 98.1 ?F (36.7 ?C)  ?SpO2: 96%  ?Weight: 133 lb 4.8 oz (60.5 kg)  ?Height: 5' 4.96" (1.65 m)  ? ?Body mass index is 22.21 kg/m?.  ? ?Physical Exam ?Vitals and nursing note reviewed.  ?Constitutional:   ?   Appearance: Normal appearance. She is well-developed.  ?HENT:  ?   Head: Normocephalic and atraumatic.  ?Eyes:  ?   Pupils: Pupils are equal, round, and reactive to light.  ?Cardiovascular:  ?   Rate and Rhythm: Normal rate and regular rhythm.  ?   Pulses: Normal pulses.  ?   Heart sounds: Normal heart sounds.  ?Pulmonary:  ?   Effort: Pulmonary effort is normal.  ?   Breath sounds: Normal breath sounds.  ?Abdominal:  ?   Palpations: Abdomen is soft.  ?Musculoskeletal:     ?   General: Normal range of motion.  ?   Cervical back: Normal range of motion and neck supple.  ?Lymphadenopathy:  ?   Cervical: No cervical adenopathy.  ?Skin: ?   General: Skin is warm and dry.  ?   Capillary Refill: Capillary refill takes less than 2 seconds.  ?Neurological:  ?   General: No focal deficit present.  ?   Mental Status: She is alert and oriented to person, place, and time.  ?Psychiatric:     ?   Mood and Affect: Mood normal.     ?   Behavior: Behavior normal.     ?   Thought Content: Thought content normal.     ?   Judgment: Judgment normal.  ? ? ?Assessment & Plan:  ?1. Shift work sleep disorder ?Reviewed medications and coping mechanisms which have been tried and eventually  failed. Will renew previous prescription for clonazepam 0.67m tablets. She will take 1/2 to 1 tablet daily if needed for insomnia related to shift work disorder and anxiety.  ?- clonazePAM (KLONOPIN) 0.5 MG tablet; One once daily as needed for sleep/insomnia.  Dispense: 30 tablet; Refill: 2 ? ?2. Generalized anxiety disorder ?Patient now seeing therapist to help manage symptoms.  ? ?3. Encounter to establish care ?Appointment today to establish new primary care provider. Will get most recent progress notes and lab results from GYN  provider to review and update patient chart.  ? ?  ?Problem List Items Addressed This Visit   ? ?  ? Other  ? Shift work sleep disorder - Primary  ? Relevant Medications  ? clonazePAM (KLONOPIN) 0.5 MG tablet  ? Generalized anxiety disorder  ? ?Other Visit Diagnoses   ? ? Encounter to establish care      ? ?  ? ? ?Outpatient Encounter Medications as of 04/16/2021  ?Medication Sig  ? amoxicillin (AMOXIL) 500 MG capsule Take 4 pills for total 2 grams.  Take 30-60 minutes prior to dental procedure. (Patient taking differently: Take 4 pills for total 2 grams.  Take 30-60 minutes prior to dental procedure. ? ?Because she has had bilateral hip replacements)  ? Ascorbic Acid (VITAMIN C) 1000 MG tablet Take 1,000 mg by mouth daily.  ? Calcium Citrate-Vitamin D (CALCIUM CITRATE + PO) Take 1 tablet by mouth every morning.  ? cetirizine (ZYRTEC) 10 MG tablet Take 10 mg by mouth daily.  ? Cholecalciferol (VITAMIN D) 2000 UNITS tablet Take 4,000 Units by mouth daily.  ? medroxyPROGESTERone (DEPO-PROVERA) 150 MG/ML injection Inject 150 mg into the muscle every 3 (three) months.  ? medroxyPROGESTERone Acetate 150 MG/ML SUSY Inject 150 mg as directed as needed.  ? [DISCONTINUED] clonazePAM (KLONOPIN) 0.5 MG tablet One once daily as needed for sleep/insomnia.  ? [DISCONTINUED] clonazePAM (KLONOPIN) 0.5 MG tablet Take 1 tablet by mouth as needed.  ? clonazePAM (KLONOPIN) 0.5 MG tablet One once daily as needed  for sleep/insomnia.  ? LIALDA 1.2 g EC tablet TAKE 2 TABLETS BY MOUTH TWO TIMES DAILY (NEEDS OFFICE VISIT)  ? [DISCONTINUED] amoxicillin-clavulanate (AUGMENTIN) 875-125 MG tablet Take 1 tablet by mouth every

## 2021-04-29 ENCOUNTER — Ambulatory Visit (INDEPENDENT_AMBULATORY_CARE_PROVIDER_SITE_OTHER): Payer: 59 | Admitting: Psychology

## 2021-04-29 DIAGNOSIS — F4322 Adjustment disorder with anxiety: Secondary | ICD-10-CM | POA: Diagnosis not present
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
## 2021-05-12 ENCOUNTER — Ambulatory Visit (INDEPENDENT_AMBULATORY_CARE_PROVIDER_SITE_OTHER): Payer: 59 | Admitting: Psychology

## 2021-05-12 DIAGNOSIS — F4322 Adjustment disorder with anxiety: Secondary | ICD-10-CM | POA: Diagnosis not present

## 2021-05-12 NOTE — Progress Notes (Signed)
[place] Counselor/Therapist Progress Note ? ?Patient ID: Brianna Shaw, MRN: [id number],   ? ?Date: 05/12/2021 ? ?Time Spent: 50 mins ? ?Treatment Type: Individual Therapy ? ?Reported Symptoms: Pt presented for today's session, via webex video, due to the virus outbreak.  Pt granted consent for the session, stating she is in her home with no one else present.  I shared with pt that I am in my office with no one else here. ? ?Mental Status Exam: ?Appearance:  Casual     ?Behavior: Appropriate  ?Motor: Normal  ?Speech/Language:  Clear and Coherent  ?Affect: Appropriate  ?Mood: normal  ?Thought process: normal  ?Thought content:   WNL  ?Sensory/Perceptual disturbances:   WNL  ?Orientation: oriented to person, place, and time/date  ?Attention: Good  ?Concentration: Good  ?Memory: WNL  ?Fund of knowledge:  Good  ?Insight:   Good  ?Judgment:  Good  ?Impulse Control: Good  ? ?Risk Assessment: ?Danger to Self:  No ?Self-injurious Behavior: No ?Danger to Others: No ?Duty to Warn:no ?Physical Aggression / Violence:No  ?Access to Firearms a concern: No  ?Gang Involvement:No  ? ?Subjective: Pt shares that she is going to [facility] tomorrow for a series concerts from Wed to Sunday.  She is then going to [facility] again.  She shares that her work "has really sucked lately.  I have been back at [employer] with one to 0 techs and little RN help.  She is averaging 5-6 pts each night.  She is thinking about letting staffing know she needs to have more help than she is getting.  The charge RN last night helped a little last night and pt was careful to express her thanks for that help.  Pt shares that she and Audry Pili (old boyfriend) are going to [place] together but they are not together.  She is not excited about spending time with him but will make the most of it with her other friends.  Encouraged pt to continue with her self care activities and we will meet in 3 wks for a follow up session, due to her travelling.   ? ?Interventions: Cognitive Behavioral Therapy ? ?Diagnosis:Adjustment disorder with anxious mood ? ?Plan: Treatment Plan ?Strengths/Abilities:  Intelligent, Intuitive, Willing to participate in therapy ?Treatment Preferences:  Outpatient Individual Therapy ?Statement of Needs:  Patient is to use CBT, mindfulness and coping skills to help manage and/or decrease symptoms associated with their diagnosis. ?Symptoms:  Depressed/Irritable mood, worry, social withdrawal ?Problems Addressed:  Depressive thoughts, Sadness, Sleep issues, etc. ?Long Term Goals:  Pt to reduce overall level, frequency, and intensity of the feelings of anxiety as evidenced by decreased irritability, negative self talk, and helpless feelings from 6 to 7 days/week to 0 to 1 days/week, per client report, for at least 3 consecutive months.  Progress: 10% ?Short Term Goals:  Pt to verbally express understanding of the relationship between feelings of anxiety and their impact on thinking patterns and behaviors.  Pt to verbalize an understanding of the role that distorted thinking plays in creating fears, excessive worry, and ruminations.  Progress: 10% ?Target Date:  02/25/2022 ?Frequency:  Bi-weekly ?Modality:  Cognitive Behavioral Therapy ?Interventions by Therapist:  Therapist will use CBT, Mindfulness exercises, Coping skills and Referrals, as needed by client. ?Client has verbally approved this treatment plan. ? ?Ivan Anchors, [facility] ?

## 2021-06-03 ENCOUNTER — Ambulatory Visit: Payer: 59 | Admitting: Psychology

## 2021-06-16 ENCOUNTER — Ambulatory Visit: Payer: 59 | Admitting: Psychology

## 2021-06-23 ENCOUNTER — Ambulatory Visit (INDEPENDENT_AMBULATORY_CARE_PROVIDER_SITE_OTHER): Payer: 59 | Admitting: Psychology

## 2021-06-23 DIAGNOSIS — F4322 Adjustment disorder with anxiety: Secondary | ICD-10-CM | POA: Diagnosis not present
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
## 2021-07-10 ENCOUNTER — Ambulatory Visit (INDEPENDENT_AMBULATORY_CARE_PROVIDER_SITE_OTHER): Payer: 59 | Admitting: Psychology

## 2021-07-10 DIAGNOSIS — F4322 Adjustment disorder with anxiety: Secondary | ICD-10-CM

## 2021-07-10 NOTE — Progress Notes (Signed)
[facility] Counselor/Therapist Progress Note  Patient ID: Brianna Shaw, MRN: [id number],    Date: 07/10/2021  Time Spent: 50 mins  Treatment Type: Individual Therapy  Reported Symptoms: Pt presented for today's session, via webex video.  Pt granted consent for the session, stating she is in her home with no one else present.  I shared with pt that I am in my office with no one else here.  Mental Status Exam: Appearance:  Casual     Behavior: Appropriate  Motor: Normal  Speech/Language:  Clear and Coherent  Affect: Appropriate  Mood: normal  Thought process: normal  Thought content:   WNL  Sensory/Perceptual disturbances:   WNL  Orientation: oriented to person, place, and time/date  Attention: Good  Concentration: Good  Memory: WNL  Fund of knowledge:  Good  Insight:   Good  Judgment:  Good  Impulse Control: Good   Risk Assessment: Danger to Self:  No Self-injurious Behavior: No Danger to Others: No Duty to Warn:no Physical Aggression / Violence:No  Access to Firearms a concern: No  Gang Involvement:No   Subjective: Pt shares that she has worked the past two nights and last weekend.  She also works Monday and Tuesday (7/4).  She is planning to spend the day tomorrow with a friend and is going to [place] on Sunday for a concert.  Pt shares she has been having problems with Audry Pili again and thinks he is being ridiculous.  Pt went to [facility] last week with a friend of hers and went to a music festival.  She had a great time with friends while there.  Audry Pili got mad at her and she thinks that was ridiculous too.  Pt shares that she has taken her mom to three appts last week; her mom got her hair done and her mom feels so much better.  Pt is planning to get her nails done after our session today as a self care activity.   Encouraged pt to continue with her self care activities and we will meet in 3 wks for a follow up session, due to her travelling.   Interventions:  Cognitive Behavioral Therapy  Diagnosis:Adjustment disorder with anxious mood  Plan: Treatment Plan Strengths/Abilities:  Intelligent, Intuitive, Willing to participate in therapy Treatment Preferences:  Outpatient Individual Therapy Statement of Needs:  Patient is to use CBT, mindfulness and coping skills to help manage and/or decrease symptoms associated with their diagnosis. Symptoms:  Depressed/Irritable mood, worry, social withdrawal Problems Addressed:  Depressive thoughts, Sadness, Sleep issues, etc. Long Term Goals:  Pt to reduce overall level, frequency, and intensity of the feelings of anxiety as evidenced by decreased irritability, negative self talk, and helpless feelings from 6 to 7 days/week to 0 to 1 days/week, per client report, for at least 3 consecutive months.  Progress: 10% Short Term Goals:  Pt to verbally express understanding of the relationship between feelings of anxiety and their impact on thinking patterns and behaviors.  Pt to verbalize an understanding of the role that distorted thinking plays in creating fears, excessive worry, and ruminations.  Progress: 10% Target Date:  02/25/2022 Frequency:  Bi-weekly Modality:  Cognitive Behavioral Therapy Interventions by Therapist:  Therapist will use CBT, Mindfulness exercises, Coping skills and Referrals, as needed by client. Client has verbally approved this treatment plan.  Ivan Anchors, [facility]

## 2021-07-14 NOTE — Progress Notes (Unsigned)
Established patient visit   Patient: Brianna Shaw   DOB: 19-Jan-1980   41 y.o. Female  MRN: [id number] Visit Date: 07/15/2021   No chief complaint on file.  Subjective    HPI  Follow up visit  -shift work sleep disorder.  --takes clonazepam 0.5 mg daily when needed to help with insomnia, especially after working night shift.  -sees therapist to help with generalized anxiety .   Medications: Outpatient Medications Prior to Visit  Medication Sig   amoxicillin (AMOXIL) 500 MG capsule Take 4 pills for total 2 grams.  Take 30-60 minutes prior to dental procedure. (Patient taking differently: Take 4 pills for total 2 grams.  Take 30-60 minutes prior to dental procedure.  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   Calcium Citrate-Vitamin D (CALCIUM CITRATE + PO) Take 1 tablet by mouth every morning.   cetirizine (ZYRTEC) 10 MG tablet Take 10 mg by mouth daily.   Cholecalciferol (VITAMIN D) 2000 UNITS tablet Take 4,000 Units by mouth daily.   clonazePAM (KLONOPIN) 0.5 MG tablet One once daily as needed for sleep/insomnia.   LIALDA 1.2 g EC tablet TAKE 2 TABLETS BY MOUTH TWO TIMES DAILY (NEEDS OFFICE VISIT)   medroxyPROGESTERone (DEPO-PROVERA) 150 MG/ML injection Inject 150 mg into the muscle every 3 (three) months.   medroxyPROGESTERone Acetate 150 MG/ML SUSY Inject 150 mg as directed as needed.   No facility-administered medications prior to visit.    Review of Systems  {Labs (Optional):23779}   Objective    There were no vitals taken for this visit. BP Readings from Last 3 Encounters:  04/16/21 95/66  12/15/20 138/85  09/15/19 (!) 134/95    Wt Readings from Last 3 Encounters:  04/16/21 133 lb 4.8 oz (60.5 kg)  10/21/17 128 lb (58.1 kg)  12/10/14 125 lb 4 oz (56.8 kg)    Physical Exam  ***  No results found for any visits on 07/15/21.  Assessment & Plan     Problem List Items Addressed  This Visit   None    No follow-ups on file.         Ronnell Freshwater, NP  [facility] [phone] (phone) [phone] (fax)  [place]

## 2021-07-15 ENCOUNTER — Ambulatory Visit (INDEPENDENT_AMBULATORY_CARE_PROVIDER_SITE_OTHER): Payer: 59 | Admitting: Nurse Practitioner

## 2021-07-15 ENCOUNTER — Ambulatory Visit: Payer: 59 | Admitting: Nurse Practitioner

## 2021-07-15 ENCOUNTER — Encounter: Payer: Self-pay | Admitting: Nurse Practitioner

## 2021-07-15 VITALS — BP 109/70 | HR 72 | Ht 64.96 in | Wt 137.8 lb

## 2021-07-15 DIAGNOSIS — G4726 Circadian rhythm sleep disorder, shift work type: Secondary | ICD-10-CM

## 2021-07-15 DIAGNOSIS — R635 Abnormal weight gain: Secondary | ICD-10-CM | POA: Diagnosis not present

## 2021-07-15 DIAGNOSIS — E559 Vitamin D deficiency, unspecified: Secondary | ICD-10-CM | POA: Diagnosis not present

## 2021-07-15 DIAGNOSIS — R5383 Other fatigue: Secondary | ICD-10-CM | POA: Diagnosis not present

## 2021-07-15 DIAGNOSIS — N911 Secondary amenorrhea: Secondary | ICD-10-CM | POA: Diagnosis not present

## 2021-07-15 DIAGNOSIS — Z0000 Encounter for general adult medical examination without abnormal findings: Secondary | ICD-10-CM | POA: Diagnosis not present

## 2021-07-16 LAB — CBC
Hematocrit: 40.9 % (ref 34.0–46.6)
Hemoglobin: 13.9 g/dL (ref 11.1–15.9)
MCH: 32.4 pg (ref 26.6–33.0)
MCHC: 34 g/dL (ref 31.5–35.7)
MCV: 95 fL (ref 79–97)
Platelets: 209 10*3/uL (ref 150–450)
RBC: 4.29 x10E6/uL (ref 3.77–5.28)
RDW: 12.3 % (ref 11.7–15.4)
WBC: 9.8 10*3/uL (ref 3.4–10.8)

## 2021-07-16 LAB — COMPREHENSIVE METABOLIC PANEL
ALT: 29 IU/L (ref 0–32)
AST: 22 IU/L (ref 0–40)
Albumin/Globulin Ratio: 1.7 (ref 1.2–2.2)
Albumin: 4.8 g/dL (ref 3.8–4.8)
Alkaline Phosphatase: 72 IU/L (ref 44–121)
BUN/Creatinine Ratio: 24 — ABNORMAL HIGH (ref 9–23)
BUN: 21 mg/dL (ref 6–24)
Bilirubin Total: 0.5 mg/dL (ref 0.0–1.2)
CO2: 23 mmol/L (ref 20–29)
Calcium: 10.1 mg/dL (ref 8.7–10.2)
Chloride: 101 mmol/L (ref 96–106)
Creatinine, Ser: 0.86 mg/dL (ref 0.57–1.00)
Globulin, Total: 2.8 g/dL (ref 1.5–4.5)
Glucose: 72 mg/dL (ref 70–99)
Potassium: 4.6 mmol/L (ref 3.5–5.2)
Sodium: 142 mmol/L (ref 134–144)
Total Protein: 7.6 g/dL (ref 6.0–8.5)
eGFR: 87 mL/min/{1.73_m2} (ref >59)

## 2021-07-16 LAB — TSH+FREE T4
Free T4: 1.27 ng/dL (ref 0.82–1.77)
TSH: 2.65 u[IU]/mL (ref 0.450–4.500)

## 2021-07-16 LAB — FSH/LH
FSH: 5 m[IU]/mL
LH: 6.1 m[IU]/mL

## 2021-07-16 LAB — VITAMIN B12: Vitamin B-12: 645 pg/mL (ref 232–1245)

## 2021-07-16 LAB — HEMOGLOBIN A1C
Est. average glucose Bld gHb Est-mCnc: 100 mg/dL
Hgb A1c MFr Bld: 5.1 % (ref 4.8–5.6)

## 2021-07-16 LAB — FERRITIN: Ferritin: 177 ng/mL — ABNORMAL HIGH (ref 15–150)

## 2021-07-16 LAB — VITAMIN D 25 HYDROXY (VIT D DEFICIENCY, FRACTURES): Vit D, 25-Hydroxy: 45.7 ng/mL (ref 30.0–100.0)

## 2021-07-16 LAB — FOLATE: Folate: 12.4 ng/mL (ref >3.0)

## 2021-07-16 LAB — ESTRADIOL: Estradiol: 102 pg/mL

## 2021-07-30 ENCOUNTER — Ambulatory Visit (INDEPENDENT_AMBULATORY_CARE_PROVIDER_SITE_OTHER): Payer: 59 | Admitting: Psychology

## 2021-07-30 DIAGNOSIS — F4322 Adjustment disorder with anxiety: Secondary | ICD-10-CM

## 2021-07-30 NOTE — Progress Notes (Signed)
[place] Counselor/Therapist Progress Note  Patient ID: Brianna Shaw, MRN: [id number],    Date: 07/30/2021  Time Spent: 50 mins  Treatment Type: Individual Therapy  Reported Symptoms: Pt presented for today's session, via webex video.  Pt granted consent for the session, stating she is in her home with no one else present.  I shared with pt that I am in my office with no one else here.  Mental Status Exam: Appearance:  Casual     Behavior: Appropriate  Motor: Normal  Speech/Language:  Clear and Coherent  Affect: Appropriate  Mood: normal  Thought process: normal  Thought content:   WNL  Sensory/Perceptual disturbances:   WNL  Orientation: oriented to person, place, and time/date  Attention: Good  Concentration: Good  Memory: WNL  Fund of knowledge:  Good  Insight:   Good  Judgment:  Good  Impulse Control: Good   Risk Assessment: Danger to Self:  No Self-injurious Behavior: No Danger to Others: No Duty to Warn:no Physical Aggression / Violence:No  Access to Firearms a concern: No  Gang Involvement:No   Subjective: Pt shares that she has been off work this week and has been to several concerts and seeing friends.  She also met with a nutritionist and will get lots of other info from the practitioner.  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  They have been to [place], [place] and are going to [facility] next week.  Pt shares that Audry Pili "has been on a different level of expectancy for me."  Encouraged pt to consider if she thinks she deserves to be treated better than Audry Pili is treating her.  Pt shares that her mom had a surgery on both feet and she is in pain and has a boot on both feet.  She is caring for her mom and she is recovering from a fever.  Encouraged pt to continue with her self care activities and we will meet in 2 wks for a follow up session.  Interventions: Cognitive  Behavioral Therapy  Diagnosis:Adjustment disorder with anxious mood  Plan: Treatment Plan Strengths/Abilities:  Intelligent, Intuitive, Willing to participate in therapy Treatment Preferences:  Outpatient Individual Therapy Statement of Needs:  Patient is to use CBT, mindfulness and coping skills to help manage and/or decrease symptoms associated with their diagnosis. Symptoms:  Depressed/Irritable mood, worry, social withdrawal Problems Addressed:  Depressive thoughts, Sadness, Sleep issues, etc. Long Term Goals:  Pt to reduce overall level, frequency, and intensity of the feelings of anxiety as evidenced by decreased irritability, negative self talk, and helpless feelings from 6 to 7 days/week to 0 to 1 days/week, per client report, for at least 3 consecutive months.  Progress: 10% Short Term Goals:  Pt to verbally express understanding of the relationship between feelings of anxiety and their impact on thinking patterns and behaviors.  Pt to verbalize an understanding of the role that distorted thinking plays in creating fears, excessive worry, and ruminations.  Progress: 10% Target Date:  02/25/2022 Frequency:  Bi-weekly Modality:  Cognitive Behavioral Therapy Interventions by Therapist:  Therapist will use CBT, Mindfulness exercises, Coping skills and Referrals, as needed by client. Client has verbally approved this treatment plan.  Ivan Anchors, [facility]

## 2021-08-13 ENCOUNTER — Ambulatory Visit (INDEPENDENT_AMBULATORY_CARE_PROVIDER_SITE_OTHER): Payer: 59 | Admitting: Psychology

## 2021-08-13 DIAGNOSIS — F4322 Adjustment disorder with anxiety: Secondary | ICD-10-CM | POA: Diagnosis not present

## 2021-08-13 NOTE — Progress Notes (Signed)
[place] Counselor/Therapist Progress Note  Patient ID: JAMIESON LISA, MRN: [id number],    Date: 08/13/2021  Time Spent: 50 mins  Treatment Type: Individual Therapy  Reported Symptoms: Pt presented for today's session, via webex video.  Pt granted consent for the session, stating she is in her home with no one else present.  I shared with pt that I am in my office with no one else here.  Mental Status Exam: Appearance:  Casual     Behavior: Appropriate  Motor: Normal  Speech/Language:  Clear and Coherent  Affect: Appropriate  Mood: normal  Thought process: normal  Thought content:   WNL  Sensory/Perceptual disturbances:   WNL  Orientation: oriented to person, place, and time/date  Attention: Good  Concentration: Good  Memory: WNL  Fund of knowledge:  Good  Insight:   Good  Judgment:  Good  Impulse Control: Good   Risk Assessment: Danger to Self:  No Self-injurious Behavior: No Danger to Others: No Duty to Warn:no Physical Aggression / Violence:No  Access to Firearms a concern: No  Gang Involvement:No   Subjective: Pt shares that she "has been to 4 concerts in the last 5-6 days."  Pt shares that she got to see several of her favorite bands in that stretch.  She has decided to disconnect from [place] and he does not seem to get why she has made this decision even though she sent him her concerns in writing; "he did not even bother to read what I wrote."  Pt is OK with making the decision and she knows it is the right decision for her.  Pt shares that she has only worked out once in the past week but she got 12K steps in at each of the concerts she went to.  She is drinking more water than she used to do and is also being mindful about how much alcohol she is consuming.  She is also planning to do more yoga and pilates and will be stretching more too.  Pt shares her mom is doing OK; she had surgery last week and pt is planning to help her take a shower after our  session.  Her mom has neuropathy in her feet and she needs a knee replacement.  Pt is working at [employer] last week and will be back there this week.  She has a friend coming into [place] from [place] and she will spend some time there.  She has three weeks off in Sept to go to more music festivals.  Her sleep has been up and down because she has been travelling a lot since our last session.  Encouraged pt to continue with her self care activities and we will meet in 3 wks for a follow up session.  Interventions: Cognitive Behavioral Therapy  Diagnosis:Adjustment disorder with anxious mood  Plan: Treatment Plan Strengths/Abilities:  Intelligent, Intuitive, Willing to participate in therapy Treatment Preferences:  Outpatient Individual Therapy Statement of Needs:  Patient is to use CBT, mindfulness and coping skills to help manage and/or decrease symptoms associated with their diagnosis. Symptoms:  Depressed/Irritable mood, worry, social withdrawal Problems Addressed:  Depressive thoughts, Sadness, Sleep issues, etc. Long Term Goals:  Pt to reduce overall level, frequency, and intensity of the feelings of anxiety as evidenced by decreased irritability, negative self talk, and helpless feelings from 6 to 7 days/week to 0 to 1 days/week, per client report, for at least 3 consecutive months.  Progress: 10% Short Term Goals:  Pt to verbally  express understanding of the relationship between feelings of anxiety and their impact on thinking patterns and behaviors.  Pt to verbalize an understanding of the role that distorted thinking plays in creating fears, excessive worry, and ruminations.  Progress: 10% Target Date:  02/25/2022 Frequency:  Bi-weekly Modality:  Cognitive Behavioral Therapy Interventions by Therapist:  Therapist will use CBT, Mindfulness exercises, Coping skills and Referrals, as needed by client. Client has verbally approved this treatment plan.  Ivan Anchors, [facility]

## 2021-08-24 ENCOUNTER — Encounter: Payer: Self-pay | Admitting: Nurse Practitioner

## 2021-09-04 ENCOUNTER — Ambulatory Visit (INDEPENDENT_AMBULATORY_CARE_PROVIDER_SITE_OTHER): Payer: 59 | Admitting: Psychology

## 2021-09-04 DIAGNOSIS — F4322 Adjustment disorder with anxiety: Secondary | ICD-10-CM | POA: Diagnosis not present

## 2021-09-04 DIAGNOSIS — Z0142 Encounter for cervical smear to confirm findings of recent normal smear following initial abnormal smear: Secondary | ICD-10-CM | POA: Diagnosis not present

## 2021-09-04 DIAGNOSIS — Z1231 Encounter for screening mammogram for malignant neoplasm of breast: Secondary | ICD-10-CM | POA: Diagnosis not present

## 2021-09-04 DIAGNOSIS — Z124 Encounter for screening for malignant neoplasm of cervix: Secondary | ICD-10-CM | POA: Diagnosis not present

## 2021-09-04 DIAGNOSIS — Z01419 Encounter for gynecological examination (general) (routine) without abnormal findings: Secondary | ICD-10-CM | POA: Diagnosis not present

## 2021-09-04 DIAGNOSIS — Z6822 Body mass index (BMI) 22.0-22.9, adult: Secondary | ICD-10-CM | POA: Diagnosis not present

## 2021-09-04 DIAGNOSIS — Z113 Encounter for screening for infections with a predominantly sexual mode of transmission: Secondary | ICD-10-CM | POA: Diagnosis not present

## 2021-09-04 DIAGNOSIS — Z01411 Encounter for gynecological examination (general) (routine) with abnormal findings: Secondary | ICD-10-CM | POA: Diagnosis not present

## 2021-09-04 NOTE — Progress Notes (Unsigned)
[place] Counselor/Therapist Progress Note  Patient ID: Brianna Shaw, MRN: [id number],    Date: 09/07/2021  Time Spent: 50 mins  Treatment Type: Individual Therapy  Reported Symptoms: Pt presented for today's session, via webex video.  Pt granted consent for the session, stating she is in her home with no one else present.  I shared with pt that I am in my office with no one else here.  Mental Status Exam: Appearance:  Casual     Behavior: Appropriate  Motor: Normal  Speech/Language:  Clear and Coherent  Affect: Appropriate  Mood: normal  Thought process: normal  Thought content:   WNL  Sensory/Perceptual disturbances:   WNL  Orientation: oriented to person, place, and time/date  Attention: Good  Concentration: Good  Memory: WNL  Fund of knowledge:  Good  Insight:   Good  Judgment:  Good  Impulse Control: Good   Risk Assessment: Danger to Self:  No Self-injurious Behavior: No Danger to Others: No Duty to Warn:no Physical Aggression / Violence:No  Access to Firearms a concern: No  Gang Involvement:No   Subjective: Pt shares that she "has had 4 birthdays of friends in the past couple of weeks.  It is hard for me to keep up with these young girls.  I have also continued to work a lot as well."  Pt shares that she got a spider bite last week but it is healing.  She is working the next two weeks and then she is going to be off for three weeks to go to music festivals.  She just bought her a new mattress to help her sleep better.  Pt told me about a hard pt she had this past week and it sounds like she did a great job with the pt.  She has worked at mostly at [employer] but has floated to [employer] a couple of times.  Pt has to start her student loan payments again in Oct and is trying to get those set up soon.  Pt has an appt with her GYN this afternoon for her annual checkup.  Encouraged pt to continue with her self care activities and we will meet in 3 wks for a follow up  session.  Interventions: Cognitive Behavioral Therapy  Diagnosis:Adjustment disorder with anxious mood  Plan: Treatment Plan Strengths/Abilities:  Intelligent, Intuitive, Willing to participate in therapy Treatment Preferences:  Outpatient Individual Therapy Statement of Needs:  Patient is to use CBT, mindfulness and coping skills to help manage and/or decrease symptoms associated with their diagnosis. Symptoms:  Depressed/Irritable mood, worry, social withdrawal Problems Addressed:  Depressive thoughts, Sadness, Sleep issues, etc. Long Term Goals:  Pt to reduce overall level, frequency, and intensity of the feelings of anxiety as evidenced by decreased irritability, negative self talk, and helpless feelings from 6 to 7 days/week to 0 to 1 days/week, per client report, for at least 3 consecutive months.  Progress: 10% Short Term Goals:  Pt to verbally express understanding of the relationship between feelings of anxiety and their impact on thinking patterns and behaviors.  Pt to verbalize an understanding of the role that distorted thinking plays in creating fears, excessive worry, and ruminations.  Progress: 10% Target Date:  02/25/2022 Frequency:  Bi-weekly Modality:  Cognitive Behavioral Therapy Interventions by Therapist:  Therapist will use CBT, Mindfulness exercises, Coping skills and Referrals, as needed by client. Client has verbally approved this treatment plan.  Ivan Anchors, [facility]

## 2021-09-16 LAB — HM PAP SMEAR: HPV, high-risk: NEGATIVE

## 2021-09-23 ENCOUNTER — Ambulatory Visit (INDEPENDENT_AMBULATORY_CARE_PROVIDER_SITE_OTHER): Payer: 59 | Admitting: Psychology

## 2021-09-23 DIAGNOSIS — F4322 Adjustment disorder with anxiety: Secondary | ICD-10-CM | POA: Diagnosis not present

## 2021-09-23 NOTE — Progress Notes (Signed)
[place] Counselor/Therapist Progress Note  Patient ID: Brianna Shaw, MRN: [id number],    Date: 09/23/2021  Time Spent: 30 mins  Treatment Type: Individual Therapy  Reported Symptoms: Pt presented for today's session, via webex video.  Pt granted consent for the session, stating she is in her home with no one else present.  I shared with pt that I am in my office with no one else here.  Mental Status Exam: Appearance:  Casual     Behavior: Appropriate  Motor: Normal  Speech/Language:  Clear and Coherent  Affect: Appropriate  Mood: normal  Thought process: normal  Thought content:   WNL  Sensory/Perceptual disturbances:   WNL  Orientation: oriented to person, place, and time/date  Attention: Good  Concentration: Good  Memory: WNL  Fund of knowledge:  Good  Insight:   Good  Judgment:  Good  Impulse Control: Good   Risk Assessment: Danger to Self:  No Self-injurious Behavior: No Danger to Others: No Duty to Warn:no Physical Aggression / Violence:No  Access to Firearms a concern: No  Gang Involvement:No   Subjective: Pt shares that she "had a rough time at the music festival in [place] for two days with bad weather and then everybody came back to my house for the other two days."  She is scheduled to go to another music festival in [place] tomorrow with several of the same friends.  Her mom acted frustrated with pt's friends being in the house last weekend.  Her boss put in extra PAL in the system for her in error and is supposed to correct it but the system is still showing the wrong PAL amounts.  Pt shares she is feeling bad from being out in the weather this past weekend.  Pt shares that Gerald Stabs continues to be difficult for her but he is not going to [place] with them.  They are going to a concert together next week in [place].  [place] is then going [place] for another festival next weekend.  Encouraged pt to take care of herself and get some rest today before leaving  again tomorrow.  We will meet in 4 weeks for a follow up session, due to her travels and my vacation.  Interventions: Cognitive Behavioral Therapy  Diagnosis:Adjustment disorder with anxious mood  Plan: Treatment Plan Strengths/Abilities:  Intelligent, Intuitive, Willing to participate in therapy Treatment Preferences:  Outpatient Individual Therapy Statement of Needs:  Patient is to use CBT, mindfulness and coping skills to help manage and/or decrease symptoms associated with their diagnosis. Symptoms:  Depressed/Irritable mood, worry, social withdrawal Problems Addressed:  Depressive thoughts, Sadness, Sleep issues, etc. Long Term Goals:  Pt to reduce overall level, frequency, and intensity of the feelings of anxiety as evidenced by decreased irritability, negative self talk, and helpless feelings from 6 to 7 days/week to 0 to 1 days/week, per client report, for at least 3 consecutive months.  Progress: 10% Short Term Goals:  Pt to verbally express understanding of the relationship between feelings of anxiety and their impact on thinking patterns and behaviors.  Pt to verbalize an understanding of the role that distorted thinking plays in creating fears, excessive worry, and ruminations.  Progress: 10% Target Date:  02/25/2022 Frequency:  Bi-weekly Modality:  Cognitive Behavioral Therapy Interventions by Therapist:  Therapist will use CBT, Mindfulness exercises, Coping skills and Referrals, as needed by client. Client has verbally approved this treatment plan.  Ivan Anchors, [facility]

## 2021-10-21 ENCOUNTER — Ambulatory Visit (INDEPENDENT_AMBULATORY_CARE_PROVIDER_SITE_OTHER): Payer: 59 | Admitting: Psychology

## 2021-10-21 DIAGNOSIS — F4322 Adjustment disorder with anxiety: Secondary | ICD-10-CM

## 2021-10-21 NOTE — Progress Notes (Addendum)
[place] Counselor/Therapist Progress Note  Patient ID: Brianna Shaw, MRN: [id number],    Date: 10/21/2021  Time Spent: 45 mins  Treatment Type: Individual Therapy  Reported Symptoms: Pt presented for today's session, via webex video.  Pt granted consent for the session, stating she is in her home with no one else present.  I shared with pt that I am in my office with no one else here.  Mental Status Exam: Appearance:  Casual     Behavior: Appropriate  Motor: Normal  Speech/Language:  Clear and Coherent  Affect: Appropriate  Mood: normal  Thought process: normal  Thought content:   WNL  Sensory/Perceptual disturbances:   WNL  Orientation: oriented to person, place, and time/date  Attention: Good  Concentration: Good  Memory: WNL  Fund of knowledge:  Good  Insight:   Good  Judgment:  Good  Impulse Control: Good   Risk Assessment: Danger to Self:  No Self-injurious Behavior: No Danger to Others: No Duty to Warn:no Physical Aggression / Violence:No  Access to Firearms a concern: No  Gang Involvement:No   Subjective: Pt shares that she "got COVID after going to all of those concerts."  Pt has recovered and worked the past two nights and those went well.  Pt shares that Brianna Shaw was at one of the concerts she went to and he was with his old girlfriend Brianna Shaw) and that frustrated pt.  Pt is back at [facility] this week and that is OK.  Pt describes her new relationship with a guy Brianna Shaw) and she does not think it is going to go anywhere because he is controlling.  This was a big deal for pt and she has processed her feeling very well about this.  Congratulated pt on being sure of what she wanted from a guy and recognizing that Brianna Shaw is not that kind of a guy.   Encouraged pt to continue with her self care activities and we will meet in 3 weeks for a follow up session.  Interventions: Cognitive Behavioral Therapy  Diagnosis:Adjustment disorder with anxious  mood  Plan: Treatment Plan Strengths/Abilities:  Intelligent, Intuitive, Willing to participate in therapy Treatment Preferences:  Outpatient Individual Therapy Statement of Needs:  Patient is to use CBT, mindfulness and coping skills to help manage and/or decrease symptoms associated with their diagnosis. Symptoms:  Depressed/Irritable mood, worry, social withdrawal Problems Addressed:  Depressive thoughts, Sadness, Sleep issues, etc. Long Term Goals:  Pt to reduce overall level, frequency, and intensity of the feelings of anxiety as evidenced by decreased irritability, negative self talk, and helpless feelings from 6 to 7 days/week to 0 to 1 days/week, per client report, for at least 3 consecutive months.  Progress: 10% Short Term Goals:  Pt to verbally express understanding of the relationship between feelings of anxiety and their impact on thinking patterns and behaviors.  Pt to verbalize an understanding of the role that distorted thinking plays in creating fears, excessive worry, and ruminations.  Progress: 10% Target Date:  02/25/2022 Frequency:  Bi-weekly Modality:  Cognitive Behavioral Therapy Interventions by Therapist:  Therapist will use CBT, Mindfulness exercises, Coping skills and Referrals, as needed by client. Client has verbally approved this treatment plan.  Ivan Anchors, [facility]

## 2021-10-29 ENCOUNTER — Encounter: Payer: Self-pay | Admitting: Nurse Practitioner

## 2021-10-29 ENCOUNTER — Ambulatory Visit: Payer: 59 | Admitting: Nurse Practitioner

## 2021-10-29 VITALS — BP 108/70 | HR 70 | Ht 64.96 in | Wt 130.0 lb

## 2021-10-29 DIAGNOSIS — F411 Generalized anxiety disorder: Secondary | ICD-10-CM

## 2021-10-29 DIAGNOSIS — G4726 Circadian rhythm sleep disorder, shift work type: Secondary | ICD-10-CM

## 2021-10-29 DIAGNOSIS — Z23 Encounter for immunization: Secondary | ICD-10-CM

## 2021-10-29 DIAGNOSIS — K5100 Ulcerative (chronic) pancolitis without complications: Secondary | ICD-10-CM | POA: Diagnosis not present

## 2021-10-29 MED ORDER — CLONAZEPAM 0.5 MG PO TABS: ORAL_TABLET | ORAL | 2 refills | Status: DC

## 2021-10-29 NOTE — Progress Notes (Signed)
Established patient visit   Patient: Brianna Shaw   DOB: 03-07-1980   41 y.o. Female  MRN: [id number] Visit Date: 10/29/2021   Chief Complaint  Patient presents with   Medication Refill   Subjective    HPI  Follow up  -anxiety --takes clonazepam at night as needed for anxiety/insomnia  -due for flu shot  --due for colon cancer screen.  --positive medical history for ulcerative colitis. On Lialda  -patient will contact her GI to schedule -she has been noticing some coldness and tingling in her fingers and toes. Happening mostly while she is at work. If she is at home and she is relaxed, this is not happening.    Medications: Outpatient Medications Prior to Visit  Medication Sig   amoxicillin (AMOXIL) 500 MG capsule Take 4 pills for total 2 grams.  Take 30-60 minutes prior to dental procedure. (Patient taking differently: Take 4 pills for total 2 grams.  Take 30-60 minutes prior to dental procedure.  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   Calcium Citrate-Vitamin D (CALCIUM CITRATE + PO) Take 1 tablet by mouth every morning.   cetirizine (ZYRTEC) 10 MG tablet Take 10 mg by mouth daily.   Cholecalciferol (VITAMIN D) 2000 UNITS tablet Take 4,000 Units by mouth daily.   medroxyPROGESTERone (DEPO-PROVERA) 150 MG/ML injection Inject 150 mg into the muscle every 3 (three) months.   [DISCONTINUED] clonazePAM (KLONOPIN) 0.5 MG tablet One once daily as needed for sleep/insomnia.   LIALDA 1.2 g EC tablet TAKE 2 TABLETS BY MOUTH TWO TIMES DAILY (NEEDS OFFICE VISIT)   No facility-administered medications prior to visit.    Review of Systems  Constitutional:  Negative for activity change, appetite change, chills, fatigue and fever.  HENT:  Negative for congestion, postnasal drip, rhinorrhea, sinus pressure, sinus pain, sneezing and sore throat.   Eyes: Negative.   Respiratory:  Negative for cough, chest  tightness, shortness of breath and wheezing.   Cardiovascular:  Negative for chest pain and palpitations.  Gastrointestinal:  Negative for abdominal pain, constipation, diarrhea, nausea and vomiting.  Endocrine: Negative for cold intolerance, heat intolerance, polydipsia and polyuria.  Genitourinary:  Negative for dyspareunia, dysuria, flank pain, frequency and urgency.  Musculoskeletal:  Negative for arthralgias, back pain and myalgias.  Skin:  Negative for rash.  Allergic/Immunologic: Negative for environmental allergies.  Neurological:  Negative for dizziness, weakness and headaches.  Hematological:  Negative for adenopathy.  Psychiatric/Behavioral:  The patient is not nervous/anxious.     Last CBC Lab Results  Component Value Date   WBC 9.8 07/15/2021   HGB 13.9 07/15/2021   HCT 40.9 07/15/2021   MCV 95 07/15/2021   MCH 32.4 07/15/2021   RDW 12.3 07/15/2021   PLT 209 35/32/9924   Last metabolic panel Lab Results  Component Value Date   GLUCOSE 72 07/15/2021   NA 142 07/15/2021   K 4.6 07/15/2021   CL 101 07/15/2021   CO2 23 07/15/2021   BUN 21 07/15/2021   CREATININE 0.86 07/15/2021   EGFR 87 07/15/2021   CALCIUM 10.1 07/15/2021   PROT 7.6 07/15/2021   ALBUMIN 4.8 07/15/2021   LABGLOB 2.8 07/15/2021   AGRATIO 1.7 07/15/2021   BILITOT 0.5 07/15/2021   ALKPHOS 72 07/15/2021   AST 22 07/15/2021   ALT 29 07/15/2021   ANIONGAP 6 11/27/2014    Last hemoglobin A1c Lab Results  Component Value Date  HGBA1C 5.1 07/15/2021   Last thyroid functions Lab Results  Component Value Date   TSH 2.650 07/15/2021   Last vitamin D Lab Results  Component Value Date   VD25OH 45.7 07/15/2021   Last vitamin B12 and Folate Lab Results  Component Value Date   VITAMINB12 645 07/15/2021   FOLATE 12.4 07/15/2021       Objective     Today's Vitals   10/29/21 1504  BP: 108/70  Pulse: 70  SpO2: 99%  Weight: 130 lb (59 kg)  Height: 5' 4.96" (1.65 m)   Body mass  index is 21.66 kg/m.  BP Readings from Last 3 Encounters:  10/29/21 108/70  07/15/21 109/70  04/16/21 95/66    Wt Readings from Last 3 Encounters:  10/29/21 130 lb (59 kg)  07/15/21 137 lb 12.8 oz (62.5 kg)  04/16/21 133 lb 4.8 oz (60.5 kg)    Physical Exam Vitals and nursing note reviewed.  Constitutional:      Appearance: Normal appearance. She is well-developed.  HENT:     Head: Normocephalic and atraumatic.     Nose: Nose normal.     Mouth/Throat:     Mouth: Mucous membranes are moist.     Pharynx: Oropharynx is clear.  Eyes:     Extraocular Movements: Extraocular movements intact.     Conjunctiva/sclera: Conjunctivae normal.     Pupils: Pupils are equal, round, and reactive to light.  Cardiovascular:     Rate and Rhythm: Normal rate and regular rhythm.     Pulses: Normal pulses.     Heart sounds: Normal heart sounds.  Pulmonary:     Effort: Pulmonary effort is normal.     Breath sounds: Normal breath sounds.  Abdominal:     Palpations: Abdomen is soft.  Musculoskeletal:        General: Normal range of motion.     Cervical back: Normal range of motion and neck supple.  Lymphadenopathy:     Cervical: No cervical adenopathy.  Skin:    General: Skin is warm and dry.     Capillary Refill: Capillary refill takes less than 2 seconds.  Neurological:     General: No focal deficit present.     Mental Status: She is alert and oriented to person, place, and time.  Psychiatric:        Mood and Affect: Mood normal.        Behavior: Behavior normal.        Thought Content: Thought content normal.        Judgment: Judgment normal.       Assessment & Plan    1. Shift work sleep disorder May continue clonazepam 0.5 mg at  bedtime when needed for  anxiety/insomnia. New prescription sent to pharmacy today  - clonazePAM (KLONOPIN) 0.5 MG tablet; One once daily as needed for sleep/insomnia.  Dispense: 30 tablet; Refill: 2  2. Ulcerative pancolitis without complication  ([place]) Continue regular visits with GI provider   3. Generalized anxiety disorder Continue regular visits with therapist  4. Need for influenza vaccination Flu vaccine administered during today's visit.  - Flu Vaccine QUAD 6+ mos PF IM (Fluarix Quad PF)    Problem List Items Addressed This Visit       Digestive   Ulcerative colitis ([place])     Other   Shift work sleep disorder - Primary   Relevant Medications   clonazePAM (KLONOPIN) 0.5 MG tablet   Generalized anxiety disorder   Other Visit Diagnoses  Need for influenza vaccination       Relevant Orders   Flu Vaccine QUAD 6+ mos PF IM (Fluarix Quad PF) (Completed)        Return in about 3 months (around 01/29/2022) for mood.         Ronnell Freshwater, NP  [facility] at [facility] [phone] (phone) [phone] (fax)  [place]

## 2021-11-12 ENCOUNTER — Ambulatory Visit: Payer: 59 | Admitting: Psychology

## 2021-12-01 ENCOUNTER — Ambulatory Visit (INDEPENDENT_AMBULATORY_CARE_PROVIDER_SITE_OTHER): Payer: 59 | Admitting: Psychology

## 2021-12-01 DIAGNOSIS — F4322 Adjustment disorder with anxiety: Secondary | ICD-10-CM | POA: Diagnosis not present

## 2021-12-01 NOTE — Progress Notes (Signed)
[place] Counselor/Therapist Progress Note  Patient ID: Brianna Shaw, MRN: [id number],    Date: 12/01/2021  Time Spent: 60 mins  Treatment Type: Individual Therapy  Reported Symptoms: Pt presented for today's session, via webex video.  Pt granted consent for the session, stating she is in her home with no one else present.  I shared with pt that I am in my office with no one else here.  Mental Status Exam: Appearance:  Casual     Behavior: Appropriate  Motor: Normal  Speech/Language:  Clear and Coherent  Affect: Appropriate  Mood: normal  Thought process: normal  Thought content:   WNL  Sensory/Perceptual disturbances:   WNL  Orientation: oriented to person, place, and time/date  Attention: Good  Concentration: Good  Memory: WNL  Fund of knowledge:  Good  Insight:   Good  Judgment:  Good  Impulse Control: Good   Risk Assessment: Danger to Self:  No Self-injurious Behavior: No Danger to Others: No Duty to Warn:no Physical Aggression / Violence:No  Access to Firearms a concern: No  Gang Involvement:No   Subjective: Pt shares that "It has been a tough month.  My friend's mom passed away since our past session and that was hard.  I was able to get with my friend, with some of her other friends, at the time of the loss of her mom and that time was meaningful."  Pt is going to be doing a Friendsgiving meal with friends next week.  Pt shares that "work has sucked recently.  They are adding a brand new scheduling and we have not had any instruction on how to put our schedules in.  They are also cutting our pay on 04/26/22 by 17% ($15.00).  Pt shares that she flew to [place] last week for a concert with her friend.  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  Pt will be working at [employer] this week, having worked this past period at [employer].  Pt shares that she went on a date last night Brianna Shaw), the first formal date in a while.  She enjoyed  the date and looks forward to meeting him again.  Encouraged pt to continue with her self care activities and we will meet in 3 weeks for a follow up session.  Interventions: Cognitive Behavioral Therapy  Diagnosis:Adjustment disorder with anxious mood  Plan: Treatment Plan Strengths/Abilities:  Intelligent, Intuitive, Willing to participate in therapy Treatment Preferences:  Outpatient Individual Therapy Statement of Needs:  Patient is to use CBT, mindfulness and coping skills to help manage and/or decrease symptoms associated with their diagnosis. Symptoms:  Depressed/Irritable mood, worry, social withdrawal Problems Addressed:  Depressive thoughts, Sadness, Sleep issues, etc. Long Term Goals:  Pt to reduce overall level, frequency, and intensity of the feelings of anxiety as evidenced by decreased irritability, negative self talk, and helpless feelings from 6 to 7 days/week to 0 to 1 days/week, per client report, for at least 3 consecutive months.  Progress: 10% Short Term Goals:  Pt to verbally express understanding of the relationship between feelings of anxiety and their impact on thinking patterns and behaviors.  Pt to verbalize an understanding of the role that distorted thinking plays in creating fears, excessive worry, and ruminations.  Progress: 10% Target Date:  02/25/2022 Frequency:  Bi-weekly Modality:  Cognitive Behavioral Therapy Interventions by Therapist:  Therapist will use CBT, Mindfulness exercises, Coping skills and Referrals, as needed by client. Client has verbally approved this treatment plan.  Ivan Anchors, [facility]

## 2021-12-07 ENCOUNTER — Other Ambulatory Visit (HOSPITAL_COMMUNITY): Payer: Self-pay

## 2021-12-07 ENCOUNTER — Other Ambulatory Visit (HOSPITAL_BASED_OUTPATIENT_CLINIC_OR_DEPARTMENT_OTHER): Payer: Self-pay

## 2021-12-07 ENCOUNTER — Encounter: Payer: Self-pay | Admitting: Internal Medicine

## 2021-12-07 ENCOUNTER — Telehealth: Payer: Self-pay | Admitting: Internal Medicine

## 2021-12-07 DIAGNOSIS — K5100 Ulcerative (chronic) pancolitis without complications: Secondary | ICD-10-CM

## 2021-12-07 MED ORDER — MEDROXYPROGESTERONE ACETATE 150 MG/ML IM SUSY
1.0000 mL | PREFILLED_SYRINGE | INTRAMUSCULAR | 2 refills | Status: DC
  Filled 2021-12-07: qty 1, 90d supply, fill #0
  Filled 2022-03-08: qty 1, 90d supply, fill #1
  Filled 2022-06-18: qty 1, 90d supply, fill #2

## 2021-12-07 NOTE — Telephone Encounter (Signed)
Left her a detailed message to call us back. I want to know if she has been getting the Lialda somewhere besides from [place] since it has been a long time since her last visit - 2019. Also if approved I need to know what pharmacy to send it in to.

## 2021-12-07 NOTE — Telephone Encounter (Signed)
Inbound call from patient wanting medicaton refill on Liaida. Please advise.  Thank you

## 2021-12-08 ENCOUNTER — Other Ambulatory Visit (HOSPITAL_COMMUNITY): Payer: Self-pay

## 2021-12-08 MED ORDER — LIALDA 1.2 G PO TBEC
4.8000 g | DELAYED_RELEASE_TABLET | Freq: Every day | ORAL | 0 refills | Status: DC
  Filled 2021-12-08: qty 360, 90d supply, fill #0

## 2021-12-08 NOTE — Telephone Encounter (Signed)
Renee called back and explained she has been taking her Lialda on and off. She requested I send it to [place]. She has upcoming colon in December.

## 2021-12-14 ENCOUNTER — Ambulatory Visit (AMBULATORY_SURGERY_CENTER): Payer: 59

## 2021-12-14 ENCOUNTER — Other Ambulatory Visit: Payer: Self-pay

## 2021-12-14 VITALS — Ht 65.0 in | Wt 129.6 lb

## 2021-12-14 DIAGNOSIS — K5180 Other ulcerative colitis without complications: Secondary | ICD-10-CM

## 2021-12-14 NOTE — Progress Notes (Signed)
Denies allergies to eggs or soy products. Denies complication of anesthesia or sedation. Denies use of weight loss medication. Denies use of O2.   Emmi instructions given for colonoscopy.  

## 2021-12-16 ENCOUNTER — Ambulatory Visit (INDEPENDENT_AMBULATORY_CARE_PROVIDER_SITE_OTHER): Payer: 59 | Admitting: Psychology

## 2021-12-16 DIAGNOSIS — F4322 Adjustment disorder with anxiety: Secondary | ICD-10-CM

## 2021-12-16 NOTE — Progress Notes (Signed)
         [place] Counselor/Therapist Progress Note  Patient ID: Brianna Shaw, MRN: [id number],    Date: 12/16/2021  Time Spent: 60 mins  Treatment Type: Individual Therapy  Reported Symptoms: Pt presented for today's session, via webex video.  Pt granted consent for the session, stating she is in her home with no one else present.  I shared with pt that I am in my office with no one else here.  Mental Status Exam: Appearance:  Casual     Behavior: Appropriate  Motor: Normal  Speech/Language:  Clear and Coherent  Affect: Appropriate  Mood: normal  Thought process: normal  Thought content:   WNL  Sensory/Perceptual disturbances:   WNL  Orientation: oriented to person, place, and time/date  Attention: Good  Concentration: Good  Memory: WNL  Fund of knowledge:  Good  Insight:   Good  Judgment:  Good  Impulse Control: Good   Risk Assessment: Danger to Self:  No Self-injurious Behavior: No Danger to Others: No Duty to Warn:no Physical Aggression / Violence:No  Access to Firearms a concern: No  Gang Involvement:No   Subjective: Pt shares that "I have been good since our last session.  I had a Friendsgiving with girlfriends last week and that was a lot of fun."  Pt shares that they are now using a new scheduling system for work and she is not sure if it will work yet.  Pt enjoyed spending time with the girls last week.  Her friend, Brianna Shaw, just lost her mom and she continues to be really sad.  Pt is planning to see Brianna Shaw again for New Years.  Pt feels responsible for taking care of her friends since she is older than them.  Pt shares she thinks she is still dating Brianna Shaw although he and his kids have been on a cruise for the past week and they haven't talked.  Encouraged pt to continue with her self care activities and we will meet in 3 weeks for a follow up session.  Interventions: Cognitive Behavioral Therapy  Diagnosis:Adjustment disorder with anxious  mood  Plan: Treatment Plan Strengths/Abilities:  Intelligent, Intuitive, Willing to participate in therapy Treatment Preferences:  Outpatient Individual Therapy Statement of Needs:  Patient is to use CBT, mindfulness and coping skills to help manage and/or decrease symptoms associated with their diagnosis. Symptoms:  Depressed/Irritable mood, worry, social withdrawal Problems Addressed:  Depressive thoughts, Sadness, Sleep issues, etc. Long Term Goals:  Pt to reduce overall level, frequency, and intensity of the feelings of anxiety as evidenced by decreased irritability, negative self talk, and helpless feelings from 6 to 7 days/week to 0 to 1 days/week, per client report, for at least 3 consecutive months.  Progress: 10% Short Term Goals:  Pt to verbally express understanding of the relationship between feelings of anxiety and their impact on thinking patterns and behaviors.  Pt to verbalize an understanding of the role that distorted thinking plays in creating fears, excessive worry, and ruminations.  Progress: 10% Target Date:  02/25/2022 Frequency:  Bi-weekly Modality:  Cognitive Behavioral Therapy Interventions by Therapist:  Therapist will use CBT, Mindfulness exercises, Coping skills and Referrals, as needed by client. Client has verbally approved this treatment plan.  Ivan Anchors, [facility]

## 2021-12-30 ENCOUNTER — Encounter: Payer: Self-pay | Admitting: Internal Medicine

## 2022-01-01 ENCOUNTER — Ambulatory Visit (AMBULATORY_SURGERY_CENTER): Payer: 59 | Admitting: Internal Medicine

## 2022-01-01 ENCOUNTER — Encounter: Payer: Self-pay | Admitting: Internal Medicine

## 2022-01-01 VITALS — BP 129/72 | HR 75 | Temp 98.6°F | Resp 13 | Ht 65.0 in | Wt 129.6 lb

## 2022-01-01 DIAGNOSIS — Z0389 Encounter for observation for other suspected diseases and conditions ruled out: Secondary | ICD-10-CM

## 2022-01-01 DIAGNOSIS — K5100 Ulcerative (chronic) pancolitis without complications: Secondary | ICD-10-CM | POA: Diagnosis not present

## 2022-01-01 MED ORDER — SODIUM CHLORIDE 0.9 % IV SOLN: 500.0000 mL | Freq: Once | INTRAVENOUS | Status: DC

## 2022-01-01 NOTE — Progress Notes (Signed)
Report given to PACU, vss 

## 2022-01-01 NOTE — Patient Instructions (Addendum)
Things look good - some areas of scarring and pseudopolyps (actually a scr also). No visible inflammation. Biopsies taken as we do - to check microscopic level.  I will let you know pathology results and when to have another routine colonoscopy by mail and/or My Chart.  Please resume normal diet and medications.  I appreciate the opportunity to care for you. Gatha Mayer, MD, FACG   YOU HAD AN ENDOSCOPIC PROCEDURE TODAY AT [facility]:   Refer to the procedure report that was given to you for any specific questions about what was found during the examination.  If the procedure report does not answer your questions, please call your gastroenterologist to clarify.  If you requested that your care partner not be given the details of your procedure findings, then the procedure report has been included in a sealed envelope for you to review at your convenience later.  YOU SHOULD EXPECT: Some feelings of bloating in the abdomen. Passage of more gas than usual.  Walking can help get rid of the air that was put into your GI tract during the procedure and reduce the bloating. If you had a lower endoscopy (such as a colonoscopy or flexible sigmoidoscopy) you may notice spotting of blood in your stool or on the toilet paper. If you underwent a bowel prep for your procedure, you may not have a normal bowel movement for a few days.  Please Note:  You might notice some irritation and congestion in your nose or some drainage.  This is from the oxygen used during your procedure.  There is no need for concern and it should clear up in a day or so.  SYMPTOMS TO REPORT IMMEDIATELY:  Following lower endoscopy (colonoscopy or flexible sigmoidoscopy):  Excessive amounts of blood in the stool  Significant tenderness or worsening of abdominal pains  Swelling of the abdomen that is new, acute  Fever of 100F or higher   For urgent or emergent issues, a gastroenterologist can be reached at any  hour by calling [phone]. Do not use MyChart messaging for urgent concerns.    DIET:  We do recommend a small meal at first, but then you may proceed to your regular diet.  Drink plenty of fluids but you should avoid alcoholic beverages for 24 hours.  ACTIVITY:  You should plan to take it easy for the rest of today and you should NOT DRIVE or use heavy machinery until tomorrow (because of the sedation medicines used during the test).    FOLLOW UP: Our staff will call the number listed on your records the next business day following your procedure.  We will call around 7:15- 8:00 am to check on you and address any questions or concerns that you may have regarding the information given to you following your procedure. If we do not reach you, we will leave a message.     If any biopsies were taken you will be contacted by phone or by letter within the next 1-3 weeks.  Please call us at [phone] if you have not heard about the biopsies in 3 weeks.    SIGNATURES/CONFIDENTIALITY: You and/or your care partner have signed paperwork which will be entered into your electronic medical record.  These signatures attest to the fact that that the information above on your After Visit Summary has been reviewed and is understood.  Full responsibility of the confidentiality of this discharge information lies with you and/or your care-partner.

## 2022-01-01 NOTE — Progress Notes (Signed)
[facility] History and Physical   Primary Care Physician:  Ronnell Freshwater, NP   Reason for Procedure:   Ulcerative colitis  Plan:    Surveillance colonoscopy     HPI: Brianna Shaw is a 41 y.o. female w/ long-standing universal ulcerative colitis - for follow-up exam   Past Medical History:  Diagnosis Date   Allergy    Anemia    Anorexia    history of/received inpt treatment in [place]   Anxiety    Avascular necrosis ([place])    Colitis, ulcerative ([place])    Depression    GERD (gastroesophageal reflux disease)    Lyme disease 01/11/2006   Personal history of colonic polyps    Sleep disorder    secondary to night shift work     Past Surgical History:  Procedure Laterality Date   COLONOSCOPY  multiple   eustachian tube     placement in childhood then removed / bilat    iron infusion     LASIK Bilateral    TOTAL HIP ARTHROPLASTY Left 01/01/2014   Procedure: LEFT TOTAL HIP ARTHROPLASTY ANTERIOR APPROACH;  Surgeon: Mauri Pole, MD;  Location: [facility];  Service: Orthopedics;  Laterality: Left;   TOTAL HIP ARTHROPLASTY Right 12/10/2014   Procedure: TOTAL RIGHT HIP ARTHROPLASTY ANTERIOR APPROACH;  Surgeon: Paralee Cancel, MD;  Location: [facility];  Service: Orthopedics;  Laterality: Right;   wisdom teeth extrcaction      Prior to Admission medications   Medication Sig Start Date End Date Taking? Authorizing Provider  Ascorbic Acid (VITAMIN C) 1000 MG tablet Take 1,000 mg by mouth daily.   Yes [provider]  Calcium Citrate-Vitamin D (CALCIUM CITRATE + PO) Take 1 tablet by mouth every morning.   Yes [provider]  cetirizine (ZYRTEC) 10 MG tablet Take 10 mg by mouth daily.   Yes [provider]  Cholecalciferol (VITAMIN D) 2000 UNITS tablet Take 4,000 Units by mouth daily.   Yes [provider]  clonazePAM (KLONOPIN) 0.5 MG tablet One once daily as needed for sleep/insomnia. 10/29/21  Yes Ronnell Freshwater, NP  LIALDA 1.2  g EC tablet Take 4 tablets (4.8 g total) by mouth daily with breakfast. 12/08/21 12/08/22 Yes Gatha Mayer, MD  polyethylene glycol powder (GLYCOLAX/MIRALAX) 17 GM/SCOOP powder Take 1 Container by mouth once.   Yes [provider]  amoxicillin (AMOXIL) 500 MG capsule Take 4 pills for total 2 grams.  Take 30-60 minutes prior to dental procedure. Patient not taking: Reported on 12/14/2021 12/31/14   Orlena Sheldon, PA-C  medroxyPROGESTERone (DEPO-PROVERA) 150 MG/ML injection Inject 150 mg into the muscle every 3 (three) months.    [provider]  medroxyPROGESTERone Acetate 150 MG/ML SUSY Inject 1 mL (150 mg total) as directed every 3 (three) months. 09/04/21   Avon Gully, NP    Current Outpatient Medications  Medication Sig Dispense Refill   Ascorbic Acid (VITAMIN C) 1000 MG tablet Take 1,000 mg by mouth daily.     Calcium Citrate-Vitamin D (CALCIUM CITRATE + PO) Take 1 tablet by mouth every morning.     cetirizine (ZYRTEC) 10 MG tablet Take 10 mg by mouth daily.     Cholecalciferol (VITAMIN D) 2000 UNITS tablet Take 4,000 Units by mouth daily.     clonazePAM (KLONOPIN) 0.5 MG tablet One once daily as needed for sleep/insomnia. 30 tablet 2   LIALDA 1.2 g EC tablet Take 4 tablets (4.8 g total) by mouth daily with breakfast. 360  tablet 0   polyethylene glycol powder (GLYCOLAX/MIRALAX) 17 GM/SCOOP powder Take 1 Container by mouth once.     amoxicillin (AMOXIL) 500 MG capsule Take 4 pills for total 2 grams.  Take 30-60 minutes prior to dental procedure. (Patient not taking: Reported on 12/14/2021) 4 capsule 0   medroxyPROGESTERone (DEPO-PROVERA) 150 MG/ML injection Inject 150 mg into the muscle every 3 (three) months.     medroxyPROGESTERone Acetate 150 MG/ML SUSY Inject 1 mL (150 mg total) as directed every 3 (three) months. 1 mL 2   Current Facility-Administered Medications  Medication Dose Route Frequency Provider Last Rate Last Admin   0.9 %  sodium chloride infusion  500  mL Intravenous Once Gatha Mayer, MD        Allergies as of 01/01/2022 - Review Complete 01/01/2022  Allergen Reaction Noted   Flagyl [metronidazole] Nausea And Vomiting 01/11/2012    Family History  Problem Relation Age of Onset   Breast cancer Mother    Diabetes Maternal Grandmother    Heart disease Maternal Grandfather    Diabetes Paternal Grandfather    Colon cancer Paternal Grandmother    Esophageal cancer Neg Hx    Rectal cancer Neg Hx    Stomach cancer Neg Hx     Social History   Socioeconomic History   Marital status: Single    Spouse name: Not on file   Number of children: 0   Years of education: Not on file   Highest education level: Not on file  Occupational History   Occupation: Programmer, multimedia: [employer]  Tobacco Use   Smoking status: Never   Smokeless tobacco: Never  Substance and Sexual Activity   Alcohol use: Yes    Alcohol/week: 8.0 standard drinks of alcohol    Types: 8 Cans of beer per week    Comment: socially    Drug use: No   Sexual activity: Not on file  Other Topics Concern   Not on file  Social History Narrative   Single, children, 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  Physical Exam: Vital signs BP 123/73   Pulse 81   Temp 98.6 F (37 C)   Ht 5' 5"  (1.651 m)   Wt 129 lb 9.6 oz (58.8 kg)   SpO2 99%   BMI 21.57 kg/m   General:   Alert,  Well-developed, well-nourished, pleasant and cooperative in  NAD Lungs:  Clear throughout to auscultation.   Heart:  Regular rate and rhythm; no murmurs, clicks, rubs,  or gallops. Abdomen:  Soft, nontender and nondistended. Normal bowel sounds.   Neuro/Psych:  Alert and cooperative. Normal mood and affect. A and O x 3   @[name] Janeway  Simonne Maffucci, MD, [facility] [phone] (pager) 01/01/2022 2:22 PM@

## 2022-01-01 NOTE — Progress Notes (Signed)
Called to room to assist during endoscopic procedure.  Patient ID and intended procedure confirmed with present staff. Received instructions for my participation in the procedure from the performing physician.  

## 2022-01-01 NOTE — Op Note (Signed)
[place] Patient Name: Brianna Shaw Procedure Date: 01/01/2022 2:15 PM MRN: [id number] Endoscopist: Gatha Mayer , MD, [id number] Age: 41 Referring MD:  Date of Birth: 04-20-1980 Gender: Female Account #: [id number] Procedure:                Colonoscopy Indications:              High risk colon cancer surveillance: Ulcerative                            pancolitis of 8 (or more) years duration dx 2010                            Also hx rectal adenoma 2010 Medicines:                Monitored Anesthesia Care Procedure:                Pre-Anesthesia Assessment:                           - Prior to the procedure, a History and Physical                            was performed, and patient medications and                            allergies were reviewed. The patient's tolerance of                            previous anesthesia was also reviewed. The risks                            and benefits of the procedure and the sedation                            options and risks were discussed with the patient.                            All questions were answered, and informed consent                            was obtained. Prior Anticoagulants: The patient has                            taken no anticoagulant or antiplatelet agents. ASA                            Grade Assessment: II - A patient with mild systemic                            disease. After reviewing the risks and benefits,                            the patient was deemed in satisfactory condition to  undergo the procedure.                           After obtaining informed consent, the colonoscope                            was passed under direct vision. Throughout the                            procedure, the patient's blood pressure, pulse, and                            oxygen saturations were monitored continuously. The                            Olympus PCF-H190DL ([id number])  Colonoscope was                            introduced through the anus and advanced to the the                            terminal ileum, with identification of the                            appendiceal orifice and IC valve. The colonoscopy                            was performed without difficulty. The patient                            tolerated the procedure well. The quality of the                            bowel preparation was good. The terminal ileum,                            ileocecal valve, appendiceal orifice, and rectum                            were photographed. The bowel preparation used was                            Miralax via split dose instruction. Scope In: 2:28:10 PM Scope Out: 2:43:33 PM Scope Withdrawal Time: 0 hours 12 minutes 23 seconds  Total Procedure Duration: 0 hours 15 minutes 23 seconds  Findings:                 The perianal and digital rectal examinations were                            normal.                           The terminal ileum appeared normal.  Scattered pseudopolyps were found in the entire                            colon.                           A scar was found in the sigmoid colon.                           The exam was otherwise without abnormality on                            direct and retroflexion views.                           Two biopsies were taken every 10 cm with a cold                            forceps from the entire colon for ulcerative                            colitis surveillance. These biopsy specimens were                            sent to Pathology. Verification of patient                            identification for the specimen was done. Estimated                            blood loss was minimal. Complications:            No immediate complications. Estimated Blood Loss:     Estimated blood loss was minimal. Impression:               - The examined portion of the ileum was  normal.                           - Pseudopolyps in the entire examined colon.                           - Scar in the sigmoid colon.                           - The examination was otherwise normal on direct                            and retroflexion views.                           - Biopsies for surveillance were taken from the                            entire colon.                           -  Personal history of colonic polyp rectal adenoma                            2010. Recommendation:           - Patient has a contact number available for                            emergencies. The signs and symptoms of potential                            delayed complications were discussed with the                            patient. Return to normal activities tomorrow.                            Written discharge instructions were provided to the                            patient.                           - Resume previous diet.                           - Continue present medications. Mesalamine                           - Repeat colonoscopy is recommended for                            surveillance. The colonoscopy date will be                            determined after pathology results from today's                            exam become available for review. Gatha Mayer, MD 01/01/2022 2:56:42 PM This report has been signed electronically.

## 2022-01-01 NOTE — Progress Notes (Signed)
Pt's states no medical or surgical changes since previsit or office visit. 

## 2022-01-06 ENCOUNTER — Telehealth: Payer: Self-pay | Admitting: *Deleted

## 2022-01-06 ENCOUNTER — Ambulatory Visit (INDEPENDENT_AMBULATORY_CARE_PROVIDER_SITE_OTHER): Payer: 59 | Admitting: Psychology

## 2022-01-06 DIAGNOSIS — F4322 Adjustment disorder with anxiety: Secondary | ICD-10-CM | POA: Diagnosis not present

## 2022-01-06 NOTE — Progress Notes (Signed)
[place] Counselor/Therapist Progress Note  Patient ID: Brianna Shaw, MRN: [id number],    Date: 01/06/2022  Time Spent: 60 mins  Treatment Type: Individual Therapy  Reported Symptoms: Pt presented for today's session, via webex video.  Pt granted consent for the session, stating she is in her home with no one else present.  I shared with pt that I am in my office with no one else here.  Mental Status Exam: Appearance:  Casual     Behavior: Appropriate  Motor: Normal  Speech/Language:  Clear and Coherent  Affect: Appropriate  Mood: normal  Thought process: normal  Thought content:   WNL  Sensory/Perceptual disturbances:   WNL  Orientation: oriented to person, place, and time/date  Attention: Good  Concentration: Good  Memory: WNL  Fund of knowledge:  Good  Insight:   Good  Judgment:  Good  Impulse Control: Good   Risk Assessment: Danger to Self:  No Self-injurious Behavior: No Danger to Others: No Duty to Warn:no Physical Aggression / Violence:No  Access to Firearms a concern: No  Gang Involvement:No   Subjective: Pt shares that "I have been really busy with work.  Tuesday night we had 4 admissions, a death, a code, and had to call the police.  Barnett Applebaum and her boyfriend came to visit me and that was good but it made me tired.  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  Pt is planning to go to [place] with friends for New Years and she has a concert to go to mid-January and her schedule is busy for the 4 wks after that.  Pt shares that she has a friend, Ronalee Belts, whose girlfriend, Museum/gallery conservator, does not like the fact that they are friends; Ronalee Belts and Museum/gallery conservator have argued about pt being his friend.  Pt shares that [name] got back from his cruise and pt has seen him once since he got back; "I am not sure what  is going on with [place].  It does not feel like it is going anywhere and that is OK."  Encouraged pt to continue with her self care activities and we will meet in 3 weeks for a follow up session.  Interventions: Cognitive Behavioral Therapy  Diagnosis:Adjustment disorder with anxious mood  Plan: Treatment Plan Strengths/Abilities:  Intelligent, Intuitive, Willing to participate in therapy Treatment Preferences:  Outpatient Individual Therapy Statement of Needs:  Patient is to use CBT, mindfulness and coping skills to help manage and/or decrease symptoms associated with their diagnosis. Symptoms:  Depressed/Irritable mood, worry, social withdrawal Problems Addressed:  Depressive thoughts, Sadness, Sleep issues, etc. Long Term Goals:  Pt to reduce overall level, frequency, and intensity of the feelings of anxiety as evidenced by decreased irritability, negative self talk, and helpless feelings from 6 to 7 days/week to 0 to 1 days/week, per client report, for at least 3 consecutive months.  Progress: 10% Short Term Goals:  Pt to verbally express understanding of the relationship between feelings of anxiety and their impact on thinking patterns and behaviors.  Pt to verbalize an understanding of the role that distorted thinking plays in creating fears, excessive worry, and ruminations.  Progress: 10% Target Date:  02/25/2022 Frequency:  Bi-weekly Modality:  Cognitive Behavioral Therapy Interventions by Therapist:  Therapist will use CBT, Mindfulness exercises, Coping skills  and Referrals, as needed by client. Client has verbally approved this treatment plan.  Ivan Anchors, [facility]

## 2022-01-06 NOTE — Telephone Encounter (Signed)
Attempted to call patient for their post-procedure follow-up call. No answer. Left voicemail.   

## 2022-01-08 ENCOUNTER — Encounter: Payer: Self-pay | Admitting: Internal Medicine

## 2022-01-21 ENCOUNTER — Ambulatory Visit: Payer: 59 | Admitting: Psychology

## 2022-01-21 DIAGNOSIS — F4322 Adjustment disorder with anxiety: Secondary | ICD-10-CM | POA: Diagnosis not present

## 2022-01-21 NOTE — Progress Notes (Signed)
[place] Counselor/Therapist Progress Note  Patient ID: OPIE FANTON, MRN: [id number],    Date: 01/21/2022  Time Spent: 60 mins  Treatment Type: Individual Therapy  Reported Symptoms: Pt presented for today's session, via webex video.  Pt granted consent for the session, stating she is in her home with no one else present.  I shared with pt that I am in my office with no one else here.  Mental Status Exam: Appearance:  Casual     Behavior: Appropriate  Motor: Normal  Speech/Language:  Clear and Coherent  Affect: Appropriate  Mood: normal  Thought process: normal  Thought content:   WNL  Sensory/Perceptual disturbances:   WNL  Orientation: oriented to person, place, and time/date  Attention: Good  Concentration: Good  Memory: WNL  Fund of knowledge:  Good  Insight:   Good  Judgment:  Good  Impulse Control: Good   Risk Assessment: Danger to Self:  No Self-injurious Behavior: No Danger to Others: No Duty to Warn:no Physical Aggression / Violence:No  Access to Firearms a concern: No  Gang Involvement:No   Subjective: Pt shares that she is going to a [employer] with a large group of friends and she is looking forward to that.  She is then going to [facility] tomorrow to see friends and go to a concert on Sunday.  She is then going to [place] next week and [place] the week after that going to concerts with friends.  She has been working regularly and the pts have been really sick.  Pt has been sick as well and is now getting better.  Her mom is doing OK; she had her 74th birthday last week.  Her mom has several doctors appts next week.  She is frustrated with her brother, Heath Lark, who is not doing much to help her with their mom.  Work has also been frustrating for pt because they have been so busy with more acute pts; she knows pts are waiting longer in the ED and there are myriad reasons for it and the system continues to complicate the process, it seems.    Encouraged pt to continue with her self care activities and we will meet in 4 weeks for a follow up session, due to her work and travel schedule.  Interventions: Cognitive Behavioral Therapy  Diagnosis:Adjustment disorder with anxious mood  Plan: Treatment Plan Strengths/Abilities:  Intelligent, Intuitive, Willing to participate in therapy Treatment Preferences:  Outpatient Individual Therapy Statement of Needs:  Patient is to use CBT, mindfulness and coping skills to help manage and/or decrease symptoms associated with their diagnosis. Symptoms:  Depressed/Irritable mood, worry, social withdrawal Problems Addressed:  Depressive thoughts, Sadness, Sleep issues, etc. Long Term Goals:  Pt to reduce overall level, frequency, and intensity of the feelings of anxiety as evidenced by decreased irritability, negative self talk, and helpless feelings from 6 to 7 days/week to 0 to 1 days/week, per client report, for at least 3 consecutive months.  Progress: 10% Short Term Goals:  Pt to verbally express understanding of the relationship between feelings of anxiety and their impact on thinking patterns and behaviors.  Pt to verbalize an understanding of the role that distorted thinking plays in creating fears, excessive worry, and ruminations.  Progress: 10% Target Date:  02/25/2022 Frequency:  Bi-weekly Modality:  Cognitive Behavioral Therapy Interventions by Therapist:  Therapist will use CBT, Mindfulness exercises, Coping skills and Referrals, as needed by client. Client has verbally approved this treatment plan.  Ivan Anchors,  [facility]

## 2022-01-29 ENCOUNTER — Ambulatory Visit: Payer: 59 | Admitting: Nurse Practitioner

## 2022-02-01 ENCOUNTER — Ambulatory Visit: Payer: 59 | Admitting: Nurse Practitioner

## 2022-02-19 ENCOUNTER — Ambulatory Visit (INDEPENDENT_AMBULATORY_CARE_PROVIDER_SITE_OTHER): Payer: 59 | Admitting: Psychology

## 2022-02-19 DIAGNOSIS — F4322 Adjustment disorder with anxiety: Secondary | ICD-10-CM | POA: Diagnosis not present

## 2022-02-19 NOTE — Progress Notes (Signed)
[place] Counselor/Therapist Progress Note  Patient ID: Brianna Shaw, MRN: [id number],    Date: 02/19/2022  Time Spent: 60 mins  Treatment Type: Individual Therapy  Reported Symptoms: Pt presented for today's session, via webex video.  Pt granted consent for the session, stating she is in her car with no one else present.  I shared with pt that I am in my office with no one else here.  Mental Status Exam: Appearance:  Casual     Behavior: Appropriate  Motor: Normal  Speech/Language:  Clear and Coherent  Affect: Appropriate  Mood: normal  Thought process: normal  Thought content:   WNL  Sensory/Perceptual disturbances:   WNL  Orientation: oriented to person, place, and time/date  Attention: Good  Concentration: Good  Memory: WNL  Fund of knowledge:  Good  Insight:   Good  Judgment:  Good  Impulse Control: Good   Risk Assessment: Danger to Self:  No Self-injurious Behavior: No Danger to Others: No Duty to Warn:no Physical Aggression / Violence:No  Access to Firearms a concern: No  Gang Involvement:No   Subjective: Pt shares that she has been good since our last session.  She went to two shows and those were fun and she enjoyed being with friends.  Pt shares that she talked with her niece (Taylor-27 yo; brother's Harrie Jeans) daughter) the day she was getting on the plane to go to [place]; her niece admitted to overdosing the week before.  She then chose to OD again the night after their conversation and spent 72 hrs in the hospital.  She is married and her husband Miquel Dunn) is supportive.  Taylor's mom Sharyn Lull) was trying to blame her husband for Taylor's issues; pt is certainly able to see that Miquel Dunn is good for [employer].  Pt shares that she is estranged from her father and her father was supporting Sharyn Lull in this situation.  Pt shares that she has not heard back from [place] for the past week.  Pt shares that the admin at work has now given them some info about their  summer schedule for weekends they have to work; she is hopeful that these dates will not change and she can make her summer travel schedule; she has purchased tickets for a show in [place] over 4th of July weekend; she is also going to [place] in [place] and going to [employer] in [place] as well.  Her friend Vikki Ports is engaged since the Fall and told her recently; pt is very happy for her.  Pt shares she has a date tomorrow night with a new guy; she met him when she went [place] [employer] [place]; they are going to dinner and a movie; "I can't even remember the last time someone took me to a legit dinner date."  They like some of the same music. Encouraged pt to continue with her self care activities and we will meet in 4 weeks for a follow up session, due to her work and travel schedule.  Interventions: Cognitive Behavioral Therapy  Diagnosis:Adjustment disorder with anxious mood  Plan: Treatment Plan Strengths/Abilities:  Intelligent, Intuitive, Willing to participate in therapy Treatment Preferences:  Outpatient Individual Therapy Statement of Needs:  Patient is to use CBT, mindfulness and coping skills to help manage and/or decrease symptoms associated with their diagnosis. Symptoms:  Depressed/Irritable mood, worry, social withdrawal Problems Addressed:  Depressive thoughts, Sadness, Sleep issues, etc. Long Term Goals:  Pt to reduce overall level, frequency, and intensity of the feelings of anxiety as  evidenced by decreased irritability, negative self talk, and helpless feelings from 6 to 7 days/week to 0 to 1 days/week, per client report, for at least 3 consecutive months.  Progress: 10% Short Term Goals:  Pt to verbally express understanding of the relationship between feelings of anxiety and their impact on thinking patterns and behaviors.  Pt to verbalize an understanding of the role that distorted thinking plays in creating fears, excessive worry, and ruminations.  Progress: 10% Target Date:   02/25/2022 Frequency:  Bi-weekly Modality:  Cognitive Behavioral Therapy Interventions by Therapist:  Therapist will use CBT, Mindfulness exercises, Coping skills and Referrals, as needed by client. Client has verbally approved this treatment plan.  Ivan Anchors, [facility]

## 2022-03-04 ENCOUNTER — Ambulatory Visit: Payer: 59 | Admitting: Nurse Practitioner

## 2022-03-08 ENCOUNTER — Other Ambulatory Visit (HOSPITAL_COMMUNITY): Payer: Self-pay

## 2022-03-11 ENCOUNTER — Ambulatory Visit (INDEPENDENT_AMBULATORY_CARE_PROVIDER_SITE_OTHER): Payer: 59 | Admitting: Psychology

## 2022-03-11 DIAGNOSIS — F4322 Adjustment disorder with anxiety: Secondary | ICD-10-CM

## 2022-03-11 NOTE — Progress Notes (Signed)
[place] Counselor/Therapist Progress Note  Patient ID: Brianna Shaw, MRN: [id number],    Date: 03/11/2022  Time Spent: 60 mins  Treatment Type: Individual Therapy  Reported Symptoms: Pt presented for today's session, via webex video.  Pt granted consent for the session, stating she is in her home with no one else present.  I shared with pt that I am in my office with no one else here.  Mental Status Exam: Appearance:  Casual     Behavior: Appropriate  Motor: Normal  Speech/Language:  Clear and Coherent  Affect: Appropriate  Mood: normal  Thought process: normal  Thought content:   WNL  Sensory/Perceptual disturbances:   WNL  Orientation: oriented to person, place, and time/date  Attention: Good  Concentration: Good  Memory: WNL  Fund of knowledge:  Good  Insight:   Good  Judgment:  Good  Impulse Control: Good   Risk Assessment: Danger to Self:  No Self-injurious Behavior: No Danger to Others: No Duty to Warn:no Physical Aggression / Violence:No  Access to Firearms a concern: No  Gang Involvement:No   Subjective: Pt shares that she has been good since our last session.  Pt shares that she went to a movie and dinner with a new guy Marylyn Ishihara).  He was late for one of their dates because he had just learned that his dog has cancer.  They have been hanging out several times since our last session.  He travels a lot too (going to [place] and [place]).  They have seen each other several times on Th's and F's when she is not working.  They spent one day talking on the phone for 7 hrs and this was really different for pt.  One of her friends, Lovena Le, has decided that they should date.  He has asked her to think about if she can see her doing it.  She does care for him and and knows he cares for her but their lives are different now.  Pt is planning a trip to [employer] and another venue in [place] this summer.  She is planning to continue going to music festivals all  summer long.  Pt is scheduled to see her PCP next week; she is wondering if she has ADHD.  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       and she can make her summer travel schedule; she has purchased tickets for a show in [place] over 4th of July weekend; she is also going to [place] in [place] and going to [place] in [place] as well.  Her friend Vikki Ports is engaged since the Fall and told her recently; pt is very happy for her.  Pt shares she has a date tomorrow night with a new guy; she met him when she went [place] [employer] [place]; they are going to dinner and a movie; "I can't even remember the last time someone took me to a legit dinner date."  They like some of the same music. Encouraged pt to continue with her self care activities and we will meet in 4 weeks for a follow up session, due to her work and travel schedule.  Interventions: Cognitive Behavioral Therapy  Diagnosis:Adjustment disorder with anxious mood  Plan: Treatment Plan Strengths/Abilities:  Intelligent, Intuitive, Willing to participate in therapy Treatment Preferences:  Outpatient Individual Therapy Statement of Needs:  Patient is to use CBT, mindfulness and coping skills to help manage and/or decrease symptoms associated with their diagnosis. Symptoms:  Depressed/Irritable  mood, worry, social withdrawal Problems Addressed:  Depressive thoughts, Sadness, Sleep issues, etc. Long Term Goals:  Pt to reduce overall level, frequency, and intensity of the feelings of anxiety as evidenced by decreased irritability, negative self talk, and helpless feelings from 6 to 7 days/week to 0 to 1 days/week, per client report, for at least 3 consecutive months.  Progress: 10% Short Term Goals:  Pt to verbally express understanding of the relationship between feelings of anxiety and their impact on thinking patterns and behaviors.  Pt to verbalize an understanding of the role that distorted thinking plays in creating fears,  excessive worry, and ruminations.  Progress: 10% Target Date:  02/26/2023 Frequency:  Bi-weekly Modality:  Cognitive Behavioral Therapy Interventions by Therapist:  Therapist will use CBT, Mindfulness exercises, Coping skills and Referrals, as needed by client. Client has verbally approved this treatment plan.  Ivan Anchors, [facility]

## 2022-03-17 ENCOUNTER — Ambulatory Visit: Payer: 59 | Admitting: Nurse Practitioner

## 2022-04-08 ENCOUNTER — Ambulatory Visit (INDEPENDENT_AMBULATORY_CARE_PROVIDER_SITE_OTHER): Payer: 59 | Admitting: Psychology

## 2022-04-08 DIAGNOSIS — F4322 Adjustment disorder with anxiety: Secondary | ICD-10-CM

## 2022-04-08 NOTE — Progress Notes (Signed)
[facility] Counselor/Therapist Progress Note  Patient ID: Brianna Shaw, MRN: [id number],    Date: 04/08/2022  Time Spent: 60 mins  Treatment Type: Individual Therapy  Reported Symptoms: Pt presented for today's session, via webex video.  Pt granted consent for the session, stating she is in her home with no one else present.  I shared with pt that I am in my office with no one else here.  Mental Status Exam: Appearance:  Casual     Behavior: Appropriate  Motor: Normal  Speech/Language:  Clear and Coherent  Affect: Appropriate  Mood: normal  Thought process: normal  Thought content:   WNL  Sensory/Perceptual disturbances:   WNL  Orientation: oriented to person, place, and time/date  Attention: Good  Concentration: Good  Memory: WNL  Fund of knowledge:  Good  Insight:   Good  Judgment:  Good  Impulse Control: Good   Risk Assessment: Danger to Self:  No Self-injurious Behavior: No Danger to Others: No Duty to Warn:no Physical Aggression / Violence:No  Access to Firearms a concern: No  Gang Involvement:No   Subjective: Pt shares that "there has been a lot going on in the last month.  My mom had an infection and she was admitted to the hospital and was septic.  She was in ICU and was out of it for about 4-5 days.  She got better and was discharged to rehab and now they are trying to kick her out of rehab and send her home and she is not ready."  Pt shares that this episode has been very concerning for pt.  Pt has had to sign lots of papers for her mom this past Saturday at the rehab facility and that was overwhelming.  Talked with pt about who to talk to at the facility and at [facility] to talk with both parties about her mom staying in the facility until she is able to manage her needs at home due to pt's work schedule.  Pt will also plan to talk with her mom after she gets home and is better about all of the financial issues for her mom (POA, how to pay her bills  for her, etc.).  Pt shares that she has been asking all of the supervisors for clarifications about what her schedule is going to be for weekends on/off.  No one has been very helpful with this process.  She has been dealing with all of this stress at the same time her mom has been sick.  Pt is continuing to see Marylyn Ishihara and he is being very supportive.  She has finally gotten positive resolution from her supervisor Veterinary surgeon).  Pt shares that she has talked with her niece this week and they are going to a concert together for her niece's birthday.  Pt and Marylyn Ishihara are doing OK but she is trying to get him to take the "[employer]" quiz so that he can understand how best to communicate with her to maximize his opportunity to understand pt better.  Encouraged pt to continue with her self care activities and we will meet in 4 weeks for a follow up session, due to her work and travel schedule.  Interventions: Cognitive Behavioral Therapy  Diagnosis:Adjustment disorder with anxious mood  Plan: Treatment Plan Strengths/Abilities:  Intelligent, Intuitive, Willing to participate in therapy Treatment Preferences:  Outpatient Individual Therapy Statement of Needs:  Patient is to use CBT, mindfulness and coping skills to help manage and/or decrease symptoms associated with their diagnosis. Symptoms:  Depressed/Irritable mood, worry, social withdrawal Problems Addressed:  Depressive thoughts, Sadness, Sleep issues, etc. Long Term Goals:  Pt to reduce overall level, frequency, and intensity of the feelings of anxiety as evidenced by decreased irritability, negative self talk, and helpless feelings from 6 to 7 days/week to 0 to 1 days/week, per client report, for at least 3 consecutive months.  Progress: 10% Short Term Goals:  Pt to verbally express understanding of the relationship between feelings of anxiety and their impact on thinking patterns and behaviors.  Pt to verbalize an understanding of the role that distorted  thinking plays in creating fears, excessive worry, and ruminations.  Progress: 10% Target Date:  02/26/2023 Frequency:  Bi-weekly Modality:  Cognitive Behavioral Therapy Interventions by Therapist:  Therapist will use CBT, Mindfulness exercises, Coping skills and Referrals, as needed by client. Client has verbally approved this treatment plan.  Ivan Anchors, [facility]

## 2022-04-14 ENCOUNTER — Ambulatory Visit (INDEPENDENT_AMBULATORY_CARE_PROVIDER_SITE_OTHER): Payer: 59 | Admitting: Nurse Practitioner

## 2022-04-14 ENCOUNTER — Encounter: Payer: Self-pay | Admitting: Nurse Practitioner

## 2022-04-14 VITALS — BP 109/70 | HR 70 | Ht 65.0 in | Wt 133.1 lb

## 2022-04-14 DIAGNOSIS — F411 Generalized anxiety disorder: Secondary | ICD-10-CM

## 2022-04-14 DIAGNOSIS — K5100 Ulcerative (chronic) pancolitis without complications: Secondary | ICD-10-CM

## 2022-04-14 DIAGNOSIS — G4726 Circadian rhythm sleep disorder, shift work type: Secondary | ICD-10-CM

## 2022-04-14 NOTE — Progress Notes (Signed)
Established patient visit   Patient: Brianna Shaw   DOB: 16-Oct-1980   42 y.o. Female  MRN: [id number] Visit Date: 04/14/2022   Chief Complaint  Patient presents with   Medical Management of Chronic Issues   Subjective    HPI  Follow up  -mood --does see counselor routinely  --takes clonazepam at night/bedtime due to insomnia related to shift work.  --takes 1/2 tablet on days when she has to work to help her sleep.  -sees GYN for well woman care   Medications: Outpatient Medications Prior to Visit  Medication Sig   Ascorbic Acid (VITAMIN C) 1000 MG tablet Take 1,000 mg by mouth daily.   Calcium Citrate-Vitamin D (CALCIUM CITRATE + PO) Take 1 tablet by mouth every morning.   cetirizine (ZYRTEC) 10 MG tablet Take 10 mg by mouth daily.   Cholecalciferol (VITAMIN D) 2000 UNITS tablet Take 4,000 Units by mouth daily.   clonazePAM (KLONOPIN) 0.5 MG tablet One once daily as needed for sleep/insomnia.   LIALDA 1.2 g EC tablet Take 4 tablets (4.8 g total) by mouth daily with breakfast.   medroxyPROGESTERone Acetate 150 MG/ML SUSY Inject 1 mL (150 mg total) as directed every 3 (three) months.   amoxicillin (AMOXIL) 500 MG capsule Take 4 pills for total 2 grams.  Take 30-60 minutes prior to dental procedure. (Patient not taking: Reported on 12/14/2021)   [DISCONTINUED] polyethylene glycol powder (GLYCOLAX/MIRALAX) 17 GM/SCOOP powder Take 1 Container by mouth once.   No facility-administered medications prior to visit.    Review of Systems See HPI     Last CBC Lab Results  Component Value Date   WBC 9.8 07/15/2021   HGB 13.9 07/15/2021   HCT 40.9 07/15/2021   MCV 95 07/15/2021   MCH 32.4 07/15/2021   RDW 12.3 07/15/2021   PLT 209 07/15/2021   Last metabolic panel Lab Results  Component Value Date   GLUCOSE 72 07/15/2021   NA 142 07/15/2021   K 4.6 07/15/2021   CL 101 07/15/2021   CO2 23 07/15/2021   BUN 21 07/15/2021   CREATININE 0.86 07/15/2021   EGFR 87 07/15/2021    CALCIUM 10.1 07/15/2021   PROT 7.6 07/15/2021   ALBUMIN 4.8 07/15/2021   LABGLOB 2.8 07/15/2021   AGRATIO 1.7 07/15/2021   BILITOT 0.5 07/15/2021   ALKPHOS 72 07/15/2021   AST 22 07/15/2021   ALT 29 07/15/2021   ANIONGAP 6 11/27/2014   Last lipids No results found for: "CHOL", "HDL", "LDLCALC", "LDLDIRECT", "TRIG", "CHOLHDL" Last hemoglobin A1c Lab Results  Component Value Date   HGBA1C 5.1 07/15/2021   Last thyroid functions Lab Results  Component Value Date   TSH 2.650 07/15/2021   Last vitamin D Lab Results  Component Value Date   VD25OH 45.7 07/15/2021       Objective     Today's Vitals   04/14/22 1532  BP: 109/70  Pulse: 70  SpO2: 97%  Weight: 133 lb 1.9 oz (60.4 kg)  Height: 5\' 5"  (1.651 m)   Body mass index is 22.15 kg/m.  BP Readings from Last 3 Encounters:  04/14/22 109/70  01/01/22 129/72  10/29/21 108/70    Wt Readings from Last 3 Encounters:  04/14/22 133 lb 1.9 oz (60.4 kg)  01/01/22 129 lb 9.6 oz (58.8 kg)  12/14/21 129 lb 9.6 oz (58.8 kg)    Physical Exam Vitals and nursing note reviewed.  Constitutional:      Appearance: Normal appearance. She is well-developed.  HENT:  Head: Normocephalic and atraumatic.     Nose: Nose normal.     Mouth/Throat:     Mouth: Mucous membranes are moist.     Pharynx: Oropharynx is clear.  Eyes:     Extraocular Movements: Extraocular movements intact.     Conjunctiva/sclera: Conjunctivae normal.     Pupils: Pupils are equal, round, and reactive to light.  Neck:     Vascular: No carotid bruit.  Cardiovascular:     Rate and Rhythm: Normal rate and regular rhythm.     Pulses: Normal pulses.     Heart sounds: Normal heart sounds.  Pulmonary:     Effort: Pulmonary effort is normal.     Breath sounds: Normal breath sounds.  Abdominal:     Palpations: Abdomen is soft.  Musculoskeletal:        General: Normal range of motion.     Cervical back: Normal range of motion and neck supple.   Lymphadenopathy:     Cervical: No cervical adenopathy.  Skin:    General: Skin is warm and dry.     Capillary Refill: Capillary refill takes less than 2 seconds.  Neurological:     General: No focal deficit present.     Mental Status: She is alert and oriented to person, place, and time.  Psychiatric:        Mood and Affect: Mood normal.        Behavior: Behavior normal.        Thought Content: Thought content normal.        Judgment: Judgment normal.      Assessment & Plan    Ulcerative pancolitis without complication [facility]) Assessment & Plan: Recent colonoscopy 12/2021 -Ulcerative colitis in endoscopic remission.  Recall colonoscopy 3 years  -regular visits with GI    Shift work sleep disorder Assessment & Plan: May take clonazepam as needed and as previously prescribed  -regular visits with counselor as scheduled.    Generalized anxiety disorder Assessment & Plan: May take clonazepam as needed and as previously prescribed  -regular visits with counselor as scheduled.       Return in about 3 months (around 07/14/2022) for health maintenance exam, FBW a week prior to visit.         Carlean Jews, NP  [facility] at [facility] [phone] (phone) [phone] (fax)  [facility]

## 2022-05-04 ENCOUNTER — Ambulatory Visit: Payer: 59 | Admitting: Psychology

## 2022-05-05 ENCOUNTER — Ambulatory Visit (INDEPENDENT_AMBULATORY_CARE_PROVIDER_SITE_OTHER): Payer: 59 | Admitting: Psychology

## 2022-05-05 DIAGNOSIS — F4322 Adjustment disorder with anxiety: Secondary | ICD-10-CM | POA: Diagnosis not present

## 2022-05-05 NOTE — Progress Notes (Signed)
[facility] Counselor/Therapist Progress Note  Patient ID: Brianna Shaw, MRN: [id number],    Date: 05/05/2022  Time Spent: 60 mins  Treatment Type: Individual Therapy  Reported Symptoms: Pt presented for today's session, via webex video.  Pt granted consent for the session, stating she is in her home with no one else present.  I shared with pt that I am in my office with no one else here.  Mental Status Exam: Appearance:  Casual     Behavior: Appropriate  Motor: Normal  Speech/Language:  Clear and Coherent  Affect: Appropriate  Mood: normal  Thought process: normal  Thought content:   WNL  Sensory/Perceptual disturbances:   WNL  Orientation: oriented to person, place, and time/date  Attention: Good  Concentration: Good  Memory: WNL  Fund of knowledge:  Good  Insight:   Good  Judgment:  Good  Impulse Control: Good   Risk Assessment: Danger to Self:  No Self-injurious Behavior: No Danger to Others: No Duty to Warn:no Physical Aggression / Violence:No  Access to Firearms a concern: No  Gang Involvement:No   Subjective: Pt shares that "mom came home from rehab the Saturday before Easter and she is having PT and OT.  She is getting around a little better and she is doing OK.  Pt and her sister are taking pt's mom and her aunt to dinner tonight for an early Mother's Day.  Pt and Ronaldo Miyamoto are no longer seeing each other; she broke up with him and went to bed to sleep and when she woke up to 37 messages from him and that was not good; she cancelled her plans to go to [facility] to see one of her favorite bands with him and ended up seeing the same band and it was a great show; she saw Ronaldo Miyamoto being attended to by EMS at the show as she was leaving the show.  She has been working every other weekend and he wants his stuff back; she is trying to make arrangements for him to get his stuff back.  He is continuing to bother her via text.  She stopped responding to him via texts.  Pt  has two weeks off the first of May to go to shows in [place], [place], [place], etc.  Pt is still having trouble getting info on her schedule going forward; she has a mtg with her manager this coming week and she plans to talk with her about her concerns.  Encouraged pt to continue with her self care activities and we will meet in 3 weeks for a follow up session.  Interventions: Cognitive Behavioral Therapy  Diagnosis:Adjustment disorder with anxious mood  Plan: Treatment Plan Strengths/Abilities:  Intelligent, Intuitive, Willing to participate in therapy Treatment Preferences:  Outpatient Individual Therapy Statement of Needs:  Patient is to use CBT, mindfulness and coping skills to help manage and/or decrease symptoms associated with their diagnosis. Symptoms:  Depressed/Irritable mood, worry, social withdrawal Problems Addressed:  Depressive thoughts, Sadness, Sleep issues, etc. Long Term Goals:  Pt to reduce overall level, frequency, and intensity of the feelings of anxiety as evidenced by decreased irritability, negative self talk, and helpless feelings from 6 to 7 days/week to 0 to 1 days/week, per client report, for at least 3 consecutive months.  Progress: 10% Short Term Goals:  Pt to verbally express understanding of the relationship between feelings of anxiety and their impact on thinking patterns and behaviors.  Pt to verbalize an understanding of the role that distorted thinking plays  in creating fears, excessive worry, and ruminations.  Progress: 10% Target Date:  02/26/2023 Frequency:  Bi-weekly Modality:  Cognitive Behavioral Therapy Interventions by Therapist:  Therapist will use CBT, Mindfulness exercises, Coping skills and Referrals, as needed by client. Client has verbally approved this treatment plan.  Karie Kirks, [facility]

## 2022-05-14 NOTE — Assessment & Plan Note (Signed)
Recent colonoscopy 12/2021 -Ulcerative colitis in endoscopic remission.  Recall colonoscopy 3 years  -regular visits with GI

## 2022-05-14 NOTE — Assessment & Plan Note (Signed)
May take clonazepam as needed and as previously prescribed  -regular visits with counselor as scheduled.

## 2022-05-14 NOTE — Assessment & Plan Note (Signed)
May take clonazepam as needed and as previously prescribed  -regular visits with counselor as scheduled.  

## 2022-05-28 ENCOUNTER — Ambulatory Visit: Payer: 59 | Admitting: Psychology

## 2022-06-03 ENCOUNTER — Ambulatory Visit (INDEPENDENT_AMBULATORY_CARE_PROVIDER_SITE_OTHER): Payer: 59 | Admitting: Psychology

## 2022-06-03 DIAGNOSIS — F4322 Adjustment disorder with anxiety: Secondary | ICD-10-CM | POA: Diagnosis not present

## 2022-06-03 NOTE — Progress Notes (Signed)
  [facility] Counselor/Therapist Progress Note  Patient ID: Brianna Shaw, MRN: [id number],    Date: 06/03/2022  Time Spent: 60 mins  Treatment Type: Individual Therapy  Reported Symptoms: Pt presented for today's session, via Caregility video.  Pt granted consent for the session, stating she is in her home with no one else present.  I shared with pt that I am in my office with no one else here.  Mental Status Exam: Appearance:  Casual     Behavior: Appropriate  Motor: Normal  Speech/Language:  Clear and Coherent  Affect: Appropriate  Mood: normal  Thought process: normal  Thought content:   WNL  Sensory/Perceptual disturbances:   WNL  Orientation: oriented to person, place, and time/date  Attention: Good  Concentration: Good  Memory: WNL  Fund of knowledge:  Good  Insight:   Good  Judgment:  Good  Impulse Control: Good   Risk Assessment: Danger to Self:  No Self-injurious Behavior: No Danger to Others: No Duty to Warn:no Physical Aggression / Violence:No  Access to Firearms a concern: No  Gang Involvement:No   Subjective: Pt shares that "I tore my calf muscle since our last session; because of that, I had to cancel my last session.  I also had to call out of work because of the injury.  My boss did not approve my summer vacation over the July 4th weekend.  I talked to her about it and she made me switch with someone else; I had to do all of the work myself to get off for my vacation that I requested months ago."  Pt shares that she has a friend who is an NP take a look at her leg and pt believes she is getting better.  She also got her a new car since her former lease ran out.  She is excited about her new [employer].  Pt shares that she met a 61 yo guy at a festival who helped take care of her when she tore her calf muscle.  Pt shares that he mom continues to recover from her hospital stay a month or so ago.  Encouraged pt to continue with her self care  activities and we will meet in 3 weeks for a follow up session.  Interventions: Cognitive Behavioral Therapy  Diagnosis:Adjustment disorder with anxious mood  Plan: Treatment Plan Strengths/Abilities:  Intelligent, Intuitive, Willing to participate in therapy Treatment Preferences:  Outpatient Individual Therapy Statement of Needs:  Patient is to use CBT, mindfulness and coping skills to help manage and/or decrease symptoms associated with their diagnosis. Symptoms:  Depressed/Irritable mood, worry, social withdrawal Problems Addressed:  Depressive thoughts, Sadness, Sleep issues, etc. Long Term Goals:  Pt to reduce overall level, frequency, and intensity of the feelings of anxiety as evidenced by decreased irritability, negative self talk, and helpless feelings from 6 to 7 days/week to 0 to 1 days/week, per client report, for at least 3 consecutive months.  Progress: 10% Short Term Goals:  Pt to verbally express understanding of the relationship between feelings of anxiety and their impact on thinking patterns and behaviors.  Pt to verbalize an understanding of the role that distorted thinking plays in creating fears, excessive worry, and ruminations.  Progress: 10% Target Date:  02/26/2023 Frequency:  Bi-weekly Modality:  Cognitive Behavioral Therapy Interventions by Therapist:  Therapist will use CBT, Mindfulness exercises, Coping skills and Referrals, as needed by client. Client has verbally approved this treatment plan.  Karie Kirks, [facility]

## 2022-06-06 ENCOUNTER — Other Ambulatory Visit: Payer: Self-pay | Admitting: Internal Medicine

## 2022-06-06 DIAGNOSIS — K5100 Ulcerative (chronic) pancolitis without complications: Secondary | ICD-10-CM

## 2022-06-08 ENCOUNTER — Other Ambulatory Visit (HOSPITAL_COMMUNITY): Payer: Self-pay

## 2022-06-08 MED ORDER — LIALDA 1.2 G PO TBEC
4.8000 g | DELAYED_RELEASE_TABLET | Freq: Every day | ORAL | 1 refills | Status: DC
  Filled 2022-06-18: qty 360, 90d supply, fill #0
  Filled 2022-06-21 – 2022-07-02 (×2): qty 120, 30d supply, fill #0

## 2022-06-18 ENCOUNTER — Other Ambulatory Visit (HOSPITAL_COMMUNITY): Payer: Self-pay

## 2022-06-19 ENCOUNTER — Other Ambulatory Visit (HOSPITAL_COMMUNITY): Payer: Self-pay

## 2022-06-21 ENCOUNTER — Telehealth: Payer: Self-pay | Admitting: Pharmacy Technician

## 2022-06-21 ENCOUNTER — Other Ambulatory Visit (HOSPITAL_COMMUNITY): Payer: Self-pay

## 2022-06-21 NOTE — Telephone Encounter (Signed)
Patient Advocate Encounter  Received notification from [facility] that prior authorization for LIALDA 1.2G is required.   PA NOT submitted on 6.10.24 Key [id number]  PA not needed (must use DAW 1) Test billing results return a copay of $990.73

## 2022-06-22 ENCOUNTER — Ambulatory Visit (INDEPENDENT_AMBULATORY_CARE_PROVIDER_SITE_OTHER): Payer: 59 | Admitting: Psychology

## 2022-06-22 DIAGNOSIS — F4322 Adjustment disorder with anxiety: Secondary | ICD-10-CM | POA: Diagnosis not present

## 2022-06-22 NOTE — Progress Notes (Signed)
[facility] Counselor/Therapist Progress Note  Patient ID: Brianna Shaw, MRN: [id number],    Date: 06/22/2022  Time Spent: 60 mins  Treatment Type: Individual Therapy  Reported Symptoms: Pt presented for today's session, via Caregility video.  Pt granted consent for the session, stating she is in her home with no one else present.  I shared with pt that I am in my office with no one else here.  Mental Status Exam: Appearance:  Casual     Behavior: Appropriate  Motor: Normal  Speech/Language:  Clear and Coherent  Affect: Appropriate  Mood: normal  Thought process: normal  Thought content:   WNL  Sensory/Perceptual disturbances:   WNL  Orientation: oriented to person, place, and time/date  Attention: Good  Concentration: Good  Memory: WNL  Fund of knowledge:  Good  Insight:   Good  Judgment:  Good  Impulse Control: Good   Risk Assessment: Danger to Self:  No Self-injurious Behavior: No Danger to Others: No Duty to Warn:no Physical Aggression / Violence:No  Access to Firearms a concern: No  Gang Involvement:No   Subjective: Pt shares that "I have been pretty good since our session.  My mom is wanting to get her other shoulder replaced instead of her knee at this time.  She will still need to have the knee replacement too."  Pt shares that her mom did not talk with her about her decision to have her shoulder replaced instead of her knee.  Her mom also told pt that she has a consult next week about putting a defibrillator in and pt does not know why this is necessary; pt is planning to go to the consult with her; that will be good that she can get her questions answered.  Pt shares that she also went to [facility] for a music festival and her friend Verdon Cummins was drinking a lot while she was there.  She is concerned about him because of how much he was drinking right now.  Pt is going to [place] this weekend for another concert/festival with another friend.  Pt has  gotten a couple of massages since our last session and her calf muscle is healing pretty well.  Encouraged pt to continue with her self care activities and we will meet in 3 weeks for a follow up session.  Interventions: Cognitive Behavioral Therapy  Diagnosis:Adjustment disorder with anxious mood  Plan: Treatment Plan Strengths/Abilities:  Intelligent, Intuitive, Willing to participate in therapy Treatment Preferences:  Outpatient Individual Therapy Statement of Needs:  Patient is to use CBT, mindfulness and coping skills to help manage and/or decrease symptoms associated with their diagnosis. Symptoms:  Depressed/Irritable mood, worry, social withdrawal Problems Addressed:  Depressive thoughts, Sadness, Sleep issues, etc. Long Term Goals:  Pt to reduce overall level, frequency, and intensity of the feelings of anxiety as evidenced by decreased irritability, negative self talk, and helpless feelings from 6 to 7 days/week to 0 to 1 days/week, per client report, for at least 3 consecutive months.  Progress: 10% Short Term Goals:  Pt to verbally express understanding of the relationship between feelings of anxiety and their impact on thinking patterns and behaviors.  Pt to verbalize an understanding of the role that distorted thinking plays in creating fears, excessive worry, and ruminations.  Progress: 10% Target Date:  02/26/2023 Frequency:  Bi-weekly Modality:  Cognitive Behavioral Therapy Interventions by Therapist:  Therapist will use CBT, Mindfulness exercises, Coping skills and Referrals, as needed by client. Client has verbally approved this  treatment plan.  Karie Kirks, [facility]

## 2022-06-23 ENCOUNTER — Encounter: Payer: Self-pay | Admitting: Nurse Practitioner

## 2022-06-23 DIAGNOSIS — R635 Abnormal weight gain: Secondary | ICD-10-CM

## 2022-07-02 ENCOUNTER — Other Ambulatory Visit (HOSPITAL_COMMUNITY): Payer: Self-pay

## 2022-07-02 ENCOUNTER — Telehealth: Payer: Self-pay | Admitting: *Deleted

## 2022-07-02 ENCOUNTER — Telehealth: Payer: Self-pay | Admitting: Internal Medicine

## 2022-07-02 ENCOUNTER — Other Ambulatory Visit: Payer: Self-pay | Admitting: Nurse Practitioner

## 2022-07-02 ENCOUNTER — Encounter: Payer: Self-pay | Admitting: Internal Medicine

## 2022-07-02 DIAGNOSIS — G4726 Circadian rhythm sleep disorder, shift work type: Secondary | ICD-10-CM

## 2022-07-02 DIAGNOSIS — K5100 Ulcerative (chronic) pancolitis without complications: Secondary | ICD-10-CM

## 2022-07-02 MED ORDER — CLONAZEPAM 0.5 MG PO TABS: ORAL_TABLET | ORAL | 1 refills | Status: DC

## 2022-07-02 MED ORDER — LIALDA 1.2 G PO TBEC
4.8000 g | DELAYED_RELEASE_TABLET | Freq: Every day | ORAL | 1 refills | Status: DC
Start: 2022-07-02 — End: 2022-08-17
  Filled 2022-07-02 – 2022-07-24 (×4): qty 360, 90d supply, fill #0

## 2022-07-02 NOTE — Telephone Encounter (Signed)
Prescription Request  07/02/2022  Pt calling inquiring about this refill she sent a MyChart Message.   LOV: 04/14/22  What is the name of the medication or equipment? Klonopin  Have you contacted your pharmacy to request a refill? No   Which pharmacy would you like this sent to?  [facility]   Patient notified that their request is being sent to the clinical staff for review and that they should receive a response within 2 business days.   Please advise at [facility] [phone]

## 2022-07-02 NOTE — Telephone Encounter (Signed)
Inbound call from patient stating her insurance company needs a tier exception for Lialda medication. Requesting a call back to discuss what steps she needs to take in order to receive medication. Please advise, thank you.

## 2022-07-02 NOTE — Telephone Encounter (Signed)
Please let her know that I have sent this to [facility]

## 2022-07-02 NOTE — Telephone Encounter (Signed)
I called and left her a detailed message that we send it in and when it gets kicked back we send it to the prior authorization team. I will refill her Lialda now to the [facility] pharmacy.

## 2022-07-05 ENCOUNTER — Other Ambulatory Visit (HOSPITAL_COMMUNITY): Payer: Self-pay

## 2022-07-12 ENCOUNTER — Other Ambulatory Visit: Payer: Self-pay

## 2022-07-12 ENCOUNTER — Other Ambulatory Visit (HOSPITAL_COMMUNITY): Payer: Self-pay

## 2022-07-12 DIAGNOSIS — Z130 Encounter for screening for diseases of the blood and blood-forming organs and certain disorders involving the immune mechanism: Secondary | ICD-10-CM

## 2022-07-12 DIAGNOSIS — Z0000 Encounter for general adult medical examination without abnormal findings: Secondary | ICD-10-CM

## 2022-07-12 DIAGNOSIS — Z1321 Encounter for screening for nutritional disorder: Secondary | ICD-10-CM

## 2022-07-12 NOTE — Telephone Encounter (Signed)
I called the [facility] pharmacy and they said her insurance didn't kick it out for a tier exception. It went through at $2971.00. I have MY CHARTED her to let her know. In the message I told her she can contact her insurance company and see if they will send [place] a form to complete or check into getting it from [place].

## 2022-07-13 ENCOUNTER — Other Ambulatory Visit: Payer: Self-pay

## 2022-07-13 ENCOUNTER — Other Ambulatory Visit (HOSPITAL_COMMUNITY): Payer: Self-pay

## 2022-07-14 ENCOUNTER — Ambulatory Visit (INDEPENDENT_AMBULATORY_CARE_PROVIDER_SITE_OTHER): Payer: 59 | Admitting: Psychology

## 2022-07-14 ENCOUNTER — Other Ambulatory Visit: Payer: 59

## 2022-07-14 DIAGNOSIS — Z130 Encounter for screening for diseases of the blood and blood-forming organs and certain disorders involving the immune mechanism: Secondary | ICD-10-CM

## 2022-07-14 DIAGNOSIS — Z0000 Encounter for general adult medical examination without abnormal findings: Secondary | ICD-10-CM

## 2022-07-14 DIAGNOSIS — Z1321 Encounter for screening for nutritional disorder: Secondary | ICD-10-CM | POA: Diagnosis not present

## 2022-07-14 DIAGNOSIS — Z13228 Encounter for screening for other metabolic disorders: Secondary | ICD-10-CM | POA: Diagnosis not present

## 2022-07-14 DIAGNOSIS — F4322 Adjustment disorder with anxiety: Secondary | ICD-10-CM | POA: Diagnosis not present

## 2022-07-14 DIAGNOSIS — R635 Abnormal weight gain: Secondary | ICD-10-CM

## 2022-07-14 DIAGNOSIS — Z1329 Encounter for screening for other suspected endocrine disorder: Secondary | ICD-10-CM | POA: Diagnosis not present

## 2022-07-14 NOTE — Progress Notes (Signed)
[facility] Counselor/Therapist Progress Note  Patient ID: SHOMARI KAUK, MRN: [id number],    Date: 07/14/2022  Time Spent: 60 mins    start time: 1000     end: 1100  Treatment Type: Individual Therapy  Reported Symptoms: Pt presented for today's session, via Caregility video.  Pt granted consent for the session, stating she is in her home with no one else present; pt understands the limits of virtual sessions.  I shared with pt that I am in my office with no one else here.  Mental Status Exam: Appearance:  Casual     Behavior: Appropriate  Motor: Normal  Speech/Language:  Clear and Coherent  Affect: Appropriate  Mood: normal  Thought process: normal  Thought content:   WNL  Sensory/Perceptual disturbances:   WNL  Orientation: oriented to person, place, and time/date  Attention: Good  Concentration: Good  Memory: WNL  Fund of knowledge:  Good  Insight:   Good  Judgment:  Good  Impulse Control: Good   Risk Assessment: Danger to Self:  No Self-injurious Behavior: No Danger to Others: No Duty to Warn:no Physical Aggression / Violence:No  Access to Firearms a concern: No  Gang Involvement:No   Subjective: Pt shares that "I have been pretty good since our session.  However, I am bummed that my PCP just quit with only about a week's notice; I am fortunate that they have assigned me another NP for my physical next week.  I am leaving for [place] tomorrow for a week.  Before last night, I had worked 5 nights in a row because my boss did not give me my requested time off so I had to switch with everybody."  Pt describes several flight stories she has encountered in the past month.  She will be going to a a 2-day music festival in [place] and having some time with other friends while she is there.  Pt shares that she has been exhausted for the past couple of weeks.  She has some friends coming to [facility] from [place] at the end of July.  Pt's mom is still needing to have her  shoulder replaced, her knee replaced, and may need a defibrillator placed.  Her defibrillator procedure is scheduled for 7/8 and pt is going to be in [place] on that date; pt asked her mom to move the procedure date and her mom said no.  Pt shares that work continues to be stressful but she does not mind the patient care portion it.  Encouraged pt to continue with her self care activities and we will meet in 3 weeks for a follow up session.  Interventions: Cognitive Behavioral Therapy  Diagnosis:Adjustment disorder with anxious mood  Plan: Treatment Plan Strengths/Abilities:  Intelligent, Intuitive, Willing to participate in therapy Treatment Preferences:  Outpatient Individual Therapy Statement of Needs:  Patient is to use CBT, mindfulness and coping skills to help manage and/or decrease symptoms associated with their diagnosis. Symptoms:  Depressed/Irritable mood, worry, social withdrawal Problems Addressed:  Depressive thoughts, Sadness, Sleep issues, etc. Long Term Goals:  Pt to reduce overall level, frequency, and intensity of the feelings of anxiety as evidenced by decreased irritability, negative self talk, and helpless feelings from 6 to 7 days/week to 0 to 1 days/week, per client report, for at least 3 consecutive months.  Progress: 10% Short Term Goals:  Pt to verbally express understanding of the relationship between feelings of anxiety and their impact on thinking patterns and behaviors.  Pt to verbalize an  understanding of the role that distorted thinking plays in creating fears, excessive worry, and ruminations.  Progress: 10% Target Date:  02/26/2023 Frequency:  Bi-weekly Modality:  Cognitive Behavioral Therapy Interventions by Therapist:  Therapist will use CBT, Mindfulness exercises, Coping skills and Referrals, as needed by client. Client has verbally approved this treatment plan.  Karie Kirks, [facility]

## 2022-07-15 LAB — COMPREHENSIVE METABOLIC PANEL
ALT: 23 IU/L (ref 0–32)
AST: 20 IU/L (ref 0–40)
Albumin: 4.7 g/dL (ref 3.9–4.9)
Alkaline Phosphatase: 65 IU/L (ref 44–121)
BUN/Creatinine Ratio: 16 (ref 9–23)
BUN: 14 mg/dL (ref 6–24)
Bilirubin Total: 0.6 mg/dL (ref 0.0–1.2)
CO2: 21 mmol/L (ref 20–29)
Calcium: 9.8 mg/dL (ref 8.7–10.2)
Chloride: 105 mmol/L (ref 96–106)
Creatinine, Ser: 0.87 mg/dL (ref 0.57–1.00)
Globulin, Total: 2.6 g/dL (ref 1.5–4.5)
Glucose: 79 mg/dL (ref 70–99)
Potassium: 4.6 mmol/L (ref 3.5–5.2)
Sodium: 142 mmol/L (ref 134–144)
Total Protein: 7.3 g/dL (ref 6.0–8.5)
eGFR: 85 mL/min/{1.73_m2} (ref >59)

## 2022-07-15 LAB — CBC
Hematocrit: 45.3 % (ref 34.0–46.6)
Hemoglobin: 14.7 g/dL (ref 11.1–15.9)
MCH: 31.3 pg (ref 26.6–33.0)
MCHC: 32.5 g/dL (ref 31.5–35.7)
MCV: 96 fL (ref 79–97)
Platelets: 226 10*3/uL (ref 150–450)
RBC: 4.7 x10E6/uL (ref 3.77–5.28)
RDW: 12.5 % (ref 11.7–15.4)
WBC: 8 10*3/uL (ref 3.4–10.8)

## 2022-07-15 LAB — LIPID PANEL
Chol/HDL Ratio: 3.3 ratio (ref 0.0–4.4)
Cholesterol, Total: 212 mg/dL — ABNORMAL HIGH (ref 100–199)
HDL: 65 mg/dL (ref >39)
LDL Chol Calc (NIH): 133 mg/dL — ABNORMAL HIGH (ref 0–99)
Triglycerides: 80 mg/dL (ref 0–149)
VLDL Cholesterol Cal: 14 mg/dL (ref 5–40)

## 2022-07-15 LAB — TSH: TSH: 3.51 u[IU]/mL (ref 0.450–4.500)

## 2022-07-15 LAB — HEMOGLOBIN A1C
Est. average glucose Bld gHb Est-mCnc: 100 mg/dL
Hgb A1c MFr Bld: 5.1 % (ref 4.8–5.6)

## 2022-07-15 LAB — CORTISOL: Cortisol: 5.3 ug/dL — ABNORMAL LOW (ref 6.2–19.4)

## 2022-07-21 ENCOUNTER — Telehealth: Payer: Self-pay

## 2022-07-21 ENCOUNTER — Other Ambulatory Visit (HOSPITAL_COMMUNITY): Payer: Self-pay

## 2022-07-21 ENCOUNTER — Encounter: Payer: 59 | Admitting: Nurse Practitioner

## 2022-07-21 NOTE — Telephone Encounter (Signed)
Hello PA team. Can you please help [place] to get her Lialda. See the 07/02/22 My Chart message.

## 2022-07-21 NOTE — Telephone Encounter (Signed)
Renee informed and she is going to call the pharmacy now.

## 2022-07-21 NOTE — Telephone Encounter (Signed)
Generic Lialda is covered for $5.00 per 30 days.

## 2022-07-22 ENCOUNTER — Encounter: Payer: Self-pay | Admitting: Family Medicine

## 2022-07-22 ENCOUNTER — Ambulatory Visit (INDEPENDENT_AMBULATORY_CARE_PROVIDER_SITE_OTHER): Payer: 59 | Admitting: Family Medicine

## 2022-07-22 VITALS — BP 111/78 | HR 87 | Resp 20 | Ht 65.0 in | Wt 133.0 lb

## 2022-07-22 DIAGNOSIS — F411 Generalized anxiety disorder: Secondary | ICD-10-CM | POA: Diagnosis not present

## 2022-07-22 DIAGNOSIS — R41840 Attention and concentration deficit: Secondary | ICD-10-CM | POA: Diagnosis not present

## 2022-07-22 DIAGNOSIS — Z1159 Encounter for screening for other viral diseases: Secondary | ICD-10-CM

## 2022-07-22 DIAGNOSIS — G4726 Circadian rhythm sleep disorder, shift work type: Secondary | ICD-10-CM

## 2022-07-22 DIAGNOSIS — Z0000 Encounter for general adult medical examination without abnormal findings: Secondary | ICD-10-CM

## 2022-07-22 MED ORDER — CLONAZEPAM 0.5 MG PO TABS
ORAL_TABLET | ORAL | 1 refills | Status: DC
Start: 2022-07-22 — End: 2023-03-17

## 2022-07-22 MED ORDER — BUPROPION HCL ER (SR) 150 MG PO TB12
150.0000 mg | ORAL_TABLET | Freq: Every day | ORAL | 2 refills | Status: DC
Start: 2022-07-22 — End: 2023-01-27

## 2022-07-22 NOTE — Assessment & Plan Note (Signed)
Continue clonazepam 0.25 to 0.5 mg daily to sleep.  We also discussed potentially trying other sleep medicines.  Provided print out information on ramelteon, mirtazapine, Belsomra, Lunesta for her to review.  Until then, follow-up every 3 months for refills.  She has tried and failed trazodone, Seroquel, Ambien, and Unisom in the past.

## 2022-07-22 NOTE — Telephone Encounter (Signed)
Our P.A. team sent me a message yesterday and she can get the generic Lialda for $5.00 for 30 days worth of meds. She is aware and she said she would call the pharmacy yesterday to check into picking it up.

## 2022-07-22 NOTE — Patient Instructions (Addendum)
I have included information for you to read about Wellbutrin that we are starting.  Start with half a tablet for 3-5 days, then transition to 1 full tablet daily right after you wake up.  If you do not like how it makes you feel, then you can taper back off of it at the same schedule that you started it.  The other sleep medications that may be a good option for you to try are called ramelteon, suvorexant, mirtazapine, eszopiclone.  Ramelteon is the one that I was telling you about that can help to reset circadian rhythm.  Suvorexant and eszopiclone are very good for chronic insomnia.  Mirtazapine or even something like doxepin can be good options for anyone that struggles with sleep and anxiety.  You can read about them and consider the options, I know there are a lot!

## 2022-07-22 NOTE — Progress Notes (Signed)
Complete physical exam  Patient: Brianna Shaw   DOB: 1980-08-27   42 y.o. Female  MRN: [id number]  Subjective:    Chief Complaint  Patient presents with   Annual Exam    Brianna Shaw is a 42 y.o. female who presents today for a complete physical exam. She reports consuming a general diet.  She generally feels fairly well. She reports sleeping fairly well.  She works night shifts and is currently taking Klonopin, melatonin, magnesium glycinate, and her cetirizine before she goes to sleep.  She still struggles to sleep at times.  She is also seeing a therapist for her anxiety.  She does not have additional problems to discuss today.    Most recent fall risk assessment:    04/14/2022    3:35 PM  Fall Risk   Falls in the past year? 0  Number falls in past yr: 0  Injury with Fall? 0  Follow up Falls evaluation completed     Most recent depression and anxiety screenings:    07/22/2022    3:29 PM 04/14/2022    3:35 PM  PHQ 2/9 Scores  PHQ - 2 Score 0 1  PHQ- 9 Score 9 11      07/22/2022    3:29 PM 04/14/2022    3:35 PM 10/29/2021    3:11 PM 07/15/2021   10:04 AM  GAD 7 : Generalized Anxiety Score  Nervous, Anxious, on Edge 2 3 1 1   Control/stop worrying 1 2 1 1   Worry too much - different things 1 2 1 1   Trouble relaxing 1 2 1 1   Restless 0 1 1 0  Easily annoyed or irritable 2 2 1 1   Afraid - awful might happen 0 0 1 0  Total GAD 7 Score 7 12 7 5   Anxiety Difficulty Somewhat difficult       Patient Active Problem List   Diagnosis Date Noted   Other fatigue 07/15/2021   Abnormal weight gain 07/15/2021   Secondary amenorrhea 07/15/2021   Vitamin D deficiency 07/15/2021   Generalized anxiety disorder 04/16/2021   S/P right THA, 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   Hx of total hip arthroplasty, left 01/01/2014   COLONIC POLYPS, ADENOMATOUS, HX OF 06/03/2009   Ulcerative colitis, unspecified, without complications (HCC) 06/02/2009   Lyme disease  07/12/2006    Past Surgical History:  Procedure Laterality Date   COLONOSCOPY  multiple   eustachian tube     placement in childhood then removed / bilat    iron infusion     LASIK Bilateral    TOTAL HIP ARTHROPLASTY Left 01/01/2014   Procedure: LEFT TOTAL HIP ARTHROPLASTY ANTERIOR APPROACH;  Surgeon: Shelda Pal, MD;  Location: [facility];  Service: Orthopedics;  Laterality: Left;   TOTAL HIP ARTHROPLASTY Right 12/10/2014   Procedure: TOTAL RIGHT HIP ARTHROPLASTY ANTERIOR APPROACH;  Surgeon: Durene Romans, MD;  Location: [facility];  Service: Orthopedics;  Laterality: Right;   wisdom teeth extrcaction     Social History   Tobacco Use   Smoking status: Never    Passive exposure: Never   Smokeless tobacco: Never  Substance Use Topics   Alcohol use: Yes    Alcohol/week: 8.0 standard drinks of alcohol    Types: 8 Cans of beer per week    Comment: socially    Drug use: No   Family History  Problem Relation Age of Onset   Breast cancer Mother    Diabetes  Maternal Grandmother    Heart disease Maternal Grandfather    Diabetes Paternal Grandfather    Colon cancer Paternal Grandmother    Esophageal cancer Neg Hx    Rectal cancer Neg Hx    Stomach cancer Neg Hx    Allergies  Allergen Reactions   Flagyl [Metronidazole] Nausea And Vomiting    Po medication only, tolerates IV     Patient Care Team: Melida Quitter, PA as PCP - General (Family Medicine)   Outpatient Medications Prior to Visit  Medication Sig   Ascorbic Acid (VITAMIN C) 1000 MG tablet Take 1,000 mg by mouth daily.   Calcium Citrate-Vitamin D (CALCIUM CITRATE + PO) Take 1 tablet by mouth every morning.   cetirizine (ZYRTEC) 10 MG tablet Take 10 mg by mouth daily.   Cholecalciferol (VITAMIN D) 2000 UNITS tablet Take 4,000 Units by mouth daily.   LIALDA 1.2 g EC tablet Take 4 tablets (4.8 g total) by mouth daily with breakfast.   medroxyPROGESTERone Acetate 150 MG/ML SUSY Inject 1 mL (150 mg total) as directed every  3 (three) months.   [DISCONTINUED] clonazePAM (KLONOPIN) 0.5 MG tablet One once daily as needed for sleep/insomnia.   [DISCONTINUED] amoxicillin (AMOXIL) 500 MG capsule Take 4 pills for total 2 grams.  Take 30-60 minutes prior to dental procedure. (Patient not taking: Reported on 12/14/2021)   No facility-administered medications prior to visit.    Review of Systems  Constitutional:  Negative for chills, fever and malaise/fatigue.  HENT:  Negative for congestion and hearing loss.   Eyes:  Negative for blurred vision and double vision.  Respiratory:  Negative for cough and shortness of breath.   Cardiovascular:  Negative for chest pain, palpitations and leg swelling.  Gastrointestinal:  Negative for abdominal pain, constipation, diarrhea and heartburn.  Genitourinary:  Negative for frequency and urgency.  Musculoskeletal:  Negative for myalgias and neck pain.  Neurological:  Negative for headaches.  Endo/Heme/Allergies:  Negative for polydipsia.  Psychiatric/Behavioral:  Positive for depression. The patient is nervous/anxious and has insomnia.       Objective:    BP 111/78 (BP Location: Left Arm, Patient Position: Sitting, Cuff Size: Normal)   Pulse 87   Resp 20   Ht 5\' 5"  (1.651 m)   Wt 133 lb (60.3 kg)   SpO2 97%   BMI 22.13 kg/m    Physical Exam Constitutional:      General: She is not in acute distress.    Appearance: Normal appearance.  HENT:     Head: Normocephalic and atraumatic.     Right Ear: Tympanic membrane, ear canal and external ear normal.     Left Ear: Tympanic membrane, ear canal and external ear normal.     Nose: Nose normal.     Mouth/Throat:     Mouth: Mucous membranes are moist.     Pharynx: No oropharyngeal exudate or posterior oropharyngeal erythema.  Eyes:     Extraocular Movements: Extraocular movements intact.     Conjunctiva/sclera: Conjunctivae normal.     Pupils: Pupils are equal, round, and reactive to light.  Neck:     Thyroid: No thyroid  mass, thyromegaly or thyroid tenderness.  Cardiovascular:     Rate and Rhythm: Normal rate and regular rhythm.     Heart sounds: Normal heart sounds. No murmur Shaw.    No friction rub. No gallop.  Pulmonary:     Effort: Pulmonary effort is normal. No respiratory distress.     Breath sounds: Normal breath  sounds. No wheezing, rhonchi or rales.  Abdominal:     General: Abdomen is flat. Bowel sounds are normal. There is no distension.     Palpations: There is no mass.  Musculoskeletal:        General: Normal range of motion.     Cervical back: Normal range of motion and neck supple.  Lymphadenopathy:     Cervical: No cervical adenopathy.  Skin:    General: Skin is warm and dry.  Neurological:     Mental Status: She is alert and oriented to person, place, and time.     Cranial Nerves: No cranial nerve deficit.     Motor: No weakness.     Deep Tendon Reflexes: Reflexes normal.  Psychiatric:        Mood and Affect: Mood normal.        Assessment & Plan:    Routine Health Maintenance and Physical Exam  Immunization History  Administered Date(s) Administered   DTaP 01/22/1980, 03/25/1980, 05/27/1980, 05/06/1981, 05/15/1985   Hepatitis B 12/02/1993, 12/30/1993, 06/03/1994, 12/05/1994   Influenza,inj,Quad PF,6+ Mos 10/29/2021   Influenza-Unspecified 10/20/2017   MMR 03/14/1981, 05/01/1997   Meningococcal Conjugate 10/29/1998   OPV 01/22/1980, 03/25/1980, 05/27/1980, 02/05/1981, 05/15/1985   PFIZER(Purple Top)SARS-COV-2 Vaccination 01/17/2019, 02/07/2019, 12/20/2019   Td 06/22/1995   Tdap 03/30/2010    Health Maintenance  Topic Date Due   HIV Screening  Never done   Hepatitis C Screening  Never done   DTaP/Tdap/Td (8 - Td or Tdap) 03/29/2020   COVID-19 Vaccine (4 - 2023-24 season) 09/11/2021   INFLUENZA VACCINE  08/12/2022   PAP SMEAR-Modifier  09/04/2024   Colonoscopy  01/01/2025   HPV VACCINES  Aged Out   She is agreeable to having HIV and hepatitis C screening done  with next round of lab work.  Discussed health benefits of physical activity, and encouraged her to engage in regular exercise appropriate for her age and condition.  Wellness examination  Difficulty concentrating -     buPROPion HCl ER (SR); Take 1 tablet (150 mg total) by mouth daily.  Dispense: 30 tablet; Refill: 2  Shift work sleep disorder Assessment & Plan: Continue clonazepam 0.25 to 0.5 mg daily to sleep.  We also discussed potentially trying other sleep medicines.  Provided print out information on ramelteon, mirtazapine, Belsomra, Lunesta for her to review.  Until then, follow-up every 3 months for refills.  She has tried and failed trazodone, Seroquel, Ambien, and Unisom in the past.  Orders: -     clonazePAM; One once daily as needed for sleep/insomnia.  Dispense: 30 tablet; Refill: 1  Generalized anxiety disorder Assessment & Plan: Continue weekly therapy appointments.   Screening for viral disease -     Hepatitis C antibody; Future -     HIV Antibody (routine testing w rflx); Future  Starting Wellbutrin for low mood and difficulty concentrating.  We discussed starting with half tablet for 3-5 days before increasing to full tablet as soon as she wakes up.  If she does not like how it makes her feel, she can taper off on the same schedule over 3-5 days.  Return in about 3 months (around 10/22/2022) for follow-up for mood and sleep.     Melida Quitter, PA

## 2022-07-22 NOTE — Assessment & Plan Note (Signed)
Continue weekly therapy appointments.

## 2022-07-24 ENCOUNTER — Other Ambulatory Visit (HOSPITAL_COMMUNITY): Payer: Self-pay

## 2022-07-26 ENCOUNTER — Other Ambulatory Visit (HOSPITAL_COMMUNITY): Payer: Self-pay

## 2022-08-05 ENCOUNTER — Ambulatory Visit (INDEPENDENT_AMBULATORY_CARE_PROVIDER_SITE_OTHER): Payer: 59 | Admitting: Psychology

## 2022-08-05 DIAGNOSIS — F4322 Adjustment disorder with anxiety: Secondary | ICD-10-CM

## 2022-08-05 NOTE — Progress Notes (Signed)
[facility] Counselor/Therapist Progress Note  Patient ID: Brianna Shaw, MRN: [id number],    Date: 08/05/2022  Time Spent: 60 mins    start time: 1500     end: 1600  Treatment Type: Individual Therapy  Reported Symptoms: Pt presented for today's session, via Caregility video.  Pt granted consent for the session, stating she is in her home with no one else present; pt understands the limits of virtual sessions.  I shared with pt that I am in my office with no one else here.  Mental Status Exam: Appearance:  Casual     Behavior: Appropriate  Motor: Normal  Speech/Language:  Clear and Coherent  Affect: Appropriate  Mood: normal  Thought process: normal  Thought content:   WNL  Sensory/Perceptual disturbances:   WNL  Orientation: oriented to person, place, and time/date  Attention: Good  Concentration: Good  Memory: WNL  Fund of knowledge:  Good  Insight:   Good  Judgment:  Good  Impulse Control: Good   Risk Assessment: Danger to Self:  No Self-injurious Behavior: No Danger to Others: No Duty to Warn:no Physical Aggression / Violence:No  Access to Firearms a concern: No  Gang Involvement:No   Subjective: Pt shares that "I have been pretty good since our session.  I just had some friends from [place] that came to town last weekend and we had a great visit.  We saw a show in [place] too.  I came back home from [place] to find my mom was really sick after she had gotten her pacemaker implanted.  My brother did not even stay with her on the day of her procedure.  We called EMS and took her to the hospital; she was admitted for 5 days."  Pt shares that she has been really stressed out because of work and her mom's health.  Pt has been frustrated by Thayer Ohm again related to his inability to make decisions about what he wanted to do the weekend her [place] friends were here.  Pt had a great time when she was in [place].  She enjoyed spending time with Kathlene November there and she shares  they may start dating.  She has talked with him ever day since she got back home.  Her friends like him a lot too.  Encouraged pt to continue with her self care activities and we will meet in 3 weeks for a follow up session.  Interventions: Cognitive Behavioral Therapy  Diagnosis:Adjustment disorder with anxious mood  Plan: Treatment Plan Strengths/Abilities:  Intelligent, Intuitive, Willing to participate in therapy Treatment Preferences:  Outpatient Individual Therapy Statement of Needs:  Patient is to use CBT, mindfulness and coping skills to help manage and/or decrease symptoms associated with their diagnosis. Symptoms:  Depressed/Irritable mood, worry, social withdrawal Problems Addressed:  Depressive thoughts, Sadness, Sleep issues, etc. Long Term Goals:  Pt to reduce overall level, frequency, and intensity of the feelings of anxiety as evidenced by decreased irritability, negative self talk, and helpless feelings from 6 to 7 days/week to 0 to 1 days/week, per client report, for at least 3 consecutive months.  Progress: 10% Short Term Goals:  Pt to verbally express understanding of the relationship between feelings of anxiety and their impact on thinking patterns and behaviors.  Pt to verbalize an understanding of the role that distorted thinking plays in creating fears, excessive worry, and ruminations.  Progress: 10% Target Date:  02/26/2023 Frequency:  Bi-weekly Modality:  Cognitive Behavioral Therapy Interventions by Therapist:  Therapist will use  CBT, Mindfulness exercises, Coping skills and Referrals, as needed by client. Client has verbally approved this treatment plan.  Karie Kirks, [facility]

## 2022-08-09 ENCOUNTER — Telehealth: Payer: Self-pay

## 2022-08-09 ENCOUNTER — Other Ambulatory Visit (HOSPITAL_COMMUNITY): Payer: Self-pay

## 2022-08-09 DIAGNOSIS — K5100 Ulcerative (chronic) pancolitis without complications: Secondary | ICD-10-CM

## 2022-08-09 NOTE — Telephone Encounter (Signed)
Brianna Shaw has been on the brand Lialda for years. She said to call [phone] to get a tier exception. I will route to the prior auth team to work on  this for [place].

## 2022-08-09 NOTE — Telephone Encounter (Signed)
PA submitted to plan with notation of patient being stable on Brand, PA pending. No specific form available for tier exception. Will be updated in additional encounter created.

## 2022-08-09 NOTE — Telephone Encounter (Signed)
*  Gastro  Pharmacy Patient Advocate Encounter   Received notification from Pt Calls Messages that prior authorization for Lialda 1.2GM dr tablets  is required/requested.   Insurance verification completed.   The patient is insured through [facility] [employer] [facility] .   Per test claim: PA required; PA submitted to [employer] via CoverMyMeds Key/confirmation #/EOC [id number] Status is pending    *processed for tier exception, Ins will pay for Brand but almost $3k for 90 day supply.  Patient has been stable on Brand and is reluctant to switch to generic

## 2022-08-13 ENCOUNTER — Other Ambulatory Visit (HOSPITAL_COMMUNITY): Payer: Self-pay

## 2022-08-13 NOTE — Telephone Encounter (Signed)
I tried to reach Renee to relay the message and her voice mail is full. I will send her a [facility] message.

## 2022-08-17 ENCOUNTER — Other Ambulatory Visit (HOSPITAL_COMMUNITY): Payer: Self-pay

## 2022-08-17 ENCOUNTER — Other Ambulatory Visit: Payer: Self-pay

## 2022-08-17 MED ORDER — MEDROXYPROGESTERONE ACETATE 150 MG/ML IM SUSY
1.0000 mL | PREFILLED_SYRINGE | INTRAMUSCULAR | 0 refills | Status: DC
  Filled 2022-08-17 – 2022-08-30 (×3): qty 1, 90d supply, fill #0
  Filled 2022-08-30: qty 1, fill #0

## 2022-08-17 MED ORDER — MESALAMINE 1.2 G PO TBEC
4.8000 g | DELAYED_RELEASE_TABLET | Freq: Every day | ORAL | 1 refills | Status: DC
Start: 2022-08-17 — End: 2023-08-26
  Filled 2022-08-17 – 2022-08-30 (×2): qty 360, 90d supply, fill #0
  Filled 2023-05-15: qty 360, 90d supply, fill #1

## 2022-08-17 NOTE — Telephone Encounter (Signed)
Renee requested I send in a rx to [facility] pharmacy for the generic Lialda  and she will check into patient assistance.

## 2022-08-18 ENCOUNTER — Other Ambulatory Visit: Payer: Self-pay

## 2022-08-20 ENCOUNTER — Ambulatory Visit: Payer: 59 | Admitting: Psychology

## 2022-08-20 NOTE — Telephone Encounter (Signed)
Brianna Shaw is aware of the denial and she said she will try the generic and hopefully it won't adversely affect her. She is going to check into patient assistance programs to get the brand name Lialda.

## 2022-08-20 NOTE — Telephone Encounter (Signed)
Pharmacy Patient Advocate Encounter  Received notification from [facility] that Prior Authorization for Lialda 1.2gm has been DENIED. Please advise how you'd like to proceed. Full denial letter will be uploaded to the media tab. See denial reason below.  This request has not been approved. We were asked to cover the brand name drug listed at the top of this letter at a lower cost. Please note that your plan does not allow for coverage of brand name medications at a lower copay when there is a generic equivalent available. This is why your request was denied. Please note that if the brand-name formulation is required, your pharmacy will need to process the pharmacy billing claim with the appropriate dispense as written (DAW) code. Contact your doctor with any further questions. A written notification letter will follow with additional details.

## 2022-08-23 ENCOUNTER — Other Ambulatory Visit (HOSPITAL_COMMUNITY): Payer: Self-pay

## 2022-08-25 ENCOUNTER — Other Ambulatory Visit (HOSPITAL_COMMUNITY): Payer: Self-pay

## 2022-08-30 ENCOUNTER — Other Ambulatory Visit (HOSPITAL_COMMUNITY): Payer: Self-pay

## 2022-08-30 ENCOUNTER — Other Ambulatory Visit: Payer: Self-pay

## 2022-09-01 ENCOUNTER — Other Ambulatory Visit (HOSPITAL_COMMUNITY): Payer: Self-pay

## 2022-09-01 ENCOUNTER — Ambulatory Visit (INDEPENDENT_AMBULATORY_CARE_PROVIDER_SITE_OTHER): Payer: 59 | Admitting: Psychology

## 2022-09-01 DIAGNOSIS — F4322 Adjustment disorder with anxiety: Secondary | ICD-10-CM

## 2022-09-01 NOTE — Progress Notes (Signed)
[facility] Counselor/Therapist Progress Note  Patient ID: Brianna Shaw, MRN: [id number],    Date: 09/01/2022  Time Spent: 60 mins    start time: 1400     end: 1500  Treatment Type: Individual Therapy  Reported Symptoms: Pt presented for today's session, via Caregility video.  Pt granted consent for the session, stating she is in her home with no one else present; pt understands the limits of virtual sessions.  I shared with pt that I am in my office with no one else here.  Mental Status Exam: Appearance:  Casual     Behavior: Appropriate  Motor: Normal  Speech/Language:  Clear and Coherent  Affect: Appropriate  Mood: normal  Thought process: normal  Thought content:   WNL  Sensory/Perceptual disturbances:   WNL  Orientation: oriented to person, place, and time/date  Attention: Good  Concentration: Good  Memory: WNL  Fund of knowledge:  Good  Insight:   Good  Judgment:  Good  Impulse Control: Good   Risk Assessment: Danger to Self:  No Self-injurious Behavior: No Danger to Others: No Duty to Warn:no Physical Aggression / Violence:No  Access to Firearms a concern: No  Gang Involvement:No   Subjective: Pt shares that "I have been pretty good since our session.  I am sleepy today.  I went to a show in [place] last night and got in bed last night at 3am and had to be at the dentist this morning at 8am.  My friends (Durene Cal and [place]) and I are leaving 9/6 at 10am from [place] to [place] then driving to [place] and going to a concert and coming back on Monday."  Pt feels like they have left her out of the planning for the trip and that is a little uncomfortable for pt.  Pt is going to [facility] tomorrow for shows and then this weekend I am flying to [place] next week for new shows.  Pt is looking forward to seeing her [place] friend Kathlene November while she is in [place] on their trip.  Kathlene November is coming to [place] the week before Thanksgiving and she bought him the ticket to come.  She  has lots of activities planned with him over the next several months and she is trying to figure out the extent of their relationship.   Encouraged pt to continue with her self care activities and we will meet in 3 weeks for a follow up session.  Interventions: Cognitive Behavioral Therapy  Diagnosis:Adjustment disorder with anxious mood  Plan: Treatment Plan Strengths/Abilities:  Intelligent, Intuitive, Willing to participate in therapy Treatment Preferences:  Outpatient Individual Therapy Statement of Needs:  Patient is to use CBT, mindfulness and coping skills to help manage and/or decrease symptoms associated with their diagnosis. Symptoms:  Depressed/Irritable mood, worry, social withdrawal Problems Addressed:  Depressive thoughts, Sadness, Sleep issues, etc. Long Term Goals:  Pt to reduce overall level, frequency, and intensity of the feelings of anxiety as evidenced by decreased irritability, negative self talk, and helpless feelings from 6 to 7 days/week to 0 to 1 days/week, per client report, for at least 3 consecutive months.  Progress: 10% Short Term Goals:  Pt to verbally express understanding of the relationship between feelings of anxiety and their impact on thinking patterns and behaviors.  Pt to verbalize an understanding of the role that distorted thinking plays in creating fears, excessive worry, and ruminations.  Progress: 10% Target Date:  02/26/2023 Frequency:  Bi-weekly Modality:  Cognitive Behavioral Therapy Interventions by Therapist:  Therapist will use CBT, Mindfulness exercises, Coping skills and Referrals, as needed by client. Client has verbally approved this treatment plan.  Karie Kirks, [facility]

## 2022-09-08 DIAGNOSIS — Z1231 Encounter for screening mammogram for malignant neoplasm of breast: Secondary | ICD-10-CM | POA: Diagnosis not present

## 2022-09-08 LAB — HM MAMMOGRAPHY

## 2022-09-09 ENCOUNTER — Encounter: Payer: Self-pay | Admitting: Family Medicine

## 2022-09-09 DIAGNOSIS — Z3042 Encounter for surveillance of injectable contraceptive: Secondary | ICD-10-CM | POA: Diagnosis not present

## 2022-09-09 DIAGNOSIS — Z01419 Encounter for gynecological examination (general) (routine) without abnormal findings: Secondary | ICD-10-CM | POA: Diagnosis not present

## 2022-09-10 ENCOUNTER — Other Ambulatory Visit (HOSPITAL_COMMUNITY): Payer: Self-pay

## 2022-09-10 MED ORDER — MEDROXYPROGESTERONE ACETATE 150 MG/ML IM SUSY
150.0000 mg | PREFILLED_SYRINGE | INTRAMUSCULAR | 3 refills | Status: DC
  Filled 2022-09-10: qty 1, 84d supply, fill #0
  Filled 2022-11-25: qty 1, 90d supply, fill #0
  Filled 2023-03-03: qty 1, 90d supply, fill #1
  Filled 2023-06-01: qty 1, 90d supply, fill #2

## 2022-09-14 ENCOUNTER — Other Ambulatory Visit (HOSPITAL_COMMUNITY): Payer: Self-pay

## 2022-09-29 ENCOUNTER — Ambulatory Visit (INDEPENDENT_AMBULATORY_CARE_PROVIDER_SITE_OTHER): Payer: 59 | Admitting: Psychology

## 2022-09-29 DIAGNOSIS — F4322 Adjustment disorder with anxiety: Secondary | ICD-10-CM

## 2022-09-29 NOTE — Progress Notes (Signed)
[facility] Counselor/Therapist Progress Note  Patient ID: LAYDEN MCPHATTER, MRN: [id number],    Date: 09/29/2022  Time Spent: 60 mins    start time: 1000     end: 1100  Treatment Type: Individual Therapy  Reported Symptoms: Pt presented for today's session, via Caregility video.  Pt granted consent for the session, stating she is in her home with no one else present; pt understands the limits of virtual sessions.  I shared with pt that I am in my office with no one else here.  Mental Status Exam: Appearance:  Casual     Behavior: Appropriate  Motor: Normal  Speech/Language:  Clear and Coherent  Affect: Appropriate  Mood: normal  Thought process: normal  Thought content:   WNL  Sensory/Perceptual disturbances:   WNL  Orientation: oriented to person, place, and time/date  Attention: Good  Concentration: Good  Memory: WNL  Fund of knowledge:  Good  Insight:   Good  Judgment:  Good  Impulse Control: Good   Risk Assessment: Danger to Self:  No Self-injurious Behavior: No Danger to Others: No Duty to Warn:no Physical Aggression / Violence:No  Access to Firearms a concern: No  Gang Involvement:No   Subjective: Pt shares that "I am so tired; I just got back early yesterday and have not really slept well yet.  I have not been in my own bed in about 12 nights."  Pt shares that she enjoyed seeing different shows while she was gone ([place], [employer], etc.).  They flew from [place] to [place] and then drove to [place], [place].  The show in [place] was covered by rain the whole time.  Pt shares that there were lots of things going on during their trip and she was not able to sleep consistently because of several of those things.  It was difficult to balance all of the personalities of the people around pt during her whole trip.  Pt was able to see Kathlene November from [place] that she really likes; he flew to [place] to meet them.  Pt enjoyed the 24 hours that she spent with Kathlene November.  Pt shares  that her niece's Ladona Ridgel) birthday is this week and they are supposed to go to a concert tonight in [place] together but she has not heard from her niece about going tonight.  Pt then has to work tomorrow and Friday.  She gets to see Kathlene November again in 2 wks at [facility].  Encouraged pt to continue with her self care activities and we will meet in 3 weeks for a follow up session.  Interventions: Cognitive Behavioral Therapy  Diagnosis:Adjustment disorder with anxious mood  Plan: Treatment Plan Strengths/Abilities:  Intelligent, Intuitive, Willing to participate in therapy Treatment Preferences:  Outpatient Individual Therapy Statement of Needs:  Patient is to use CBT, mindfulness and coping skills to help manage and/or decrease symptoms associated with their diagnosis. Symptoms:  Depressed/Irritable mood, worry, social withdrawal Problems Addressed:  Depressive thoughts, Sadness, Sleep issues, etc. Long Term Goals:  Pt to reduce overall level, frequency, and intensity of the feelings of anxiety as evidenced by decreased irritability, negative self talk, and helpless feelings from 6 to 7 days/week to 0 to 1 days/week, per client report, for at least 3 consecutive months.  Progress: 10% Short Term Goals:  Pt to verbally express understanding of the relationship between feelings of anxiety and their impact on thinking patterns and behaviors.  Pt to verbalize an understanding of the role that distorted thinking plays in creating  fears, excessive worry, and ruminations.  Progress: 10% Target Date:  02/26/2023 Frequency:  Bi-weekly Modality:  Cognitive Behavioral Therapy Interventions by Therapist:  Therapist will use CBT, Mindfulness exercises, Coping skills and Referrals, as needed by client. Client has verbally approved this treatment plan.  Karie Kirks, [facility]

## 2022-10-19 ENCOUNTER — Ambulatory Visit: Payer: 59 | Admitting: Psychology

## 2022-10-26 ENCOUNTER — Ambulatory Visit (INDEPENDENT_AMBULATORY_CARE_PROVIDER_SITE_OTHER): Payer: 59 | Admitting: Family Medicine

## 2022-10-26 ENCOUNTER — Encounter: Payer: Self-pay | Admitting: Family Medicine

## 2022-10-26 VITALS — BP 93/62 | HR 60 | Resp 20 | Ht 65.0 in | Wt 134.0 lb

## 2022-10-26 DIAGNOSIS — G4726 Circadian rhythm sleep disorder, shift work type: Secondary | ICD-10-CM | POA: Diagnosis not present

## 2022-10-26 DIAGNOSIS — F411 Generalized anxiety disorder: Secondary | ICD-10-CM

## 2022-10-26 NOTE — Assessment & Plan Note (Signed)
GAD-7 score 5, improvement from 12 at last appointment.  Continue weekly therapy appointments.  May consider mirtazapine for anxiety and shiftwork sleep disorder.

## 2022-10-26 NOTE — Progress Notes (Signed)
Established Patient Office Visit  Subjective   Patient ID: Brianna Shaw, female    DOB: 25-Apr-1980  Age: 42 y.o. MRN: [id number]  Chief Complaint  Patient presents with   Anxiety   Depression   Insomnia    HPI Brianna Shaw is a 42 y.o. female presenting today for follow up of sleep, mood. Insomnia: She struggles with sleep induction.  She works night shift and still struggles to fall asleep even when taking Klonopin, melatonin, magnesium glycinate, and cetirizine at bedtime.  Previously tried and failed trazodone, Seroquel, Ambien, and Unisom in the past.  At last appointment, received information to review about ramelteon, mirtazapine, Belsomra, Lunesta.  She has not had a chance to review this information yet. Mood: Patient is here to follow up for anxiety and trouble concentrating.  She has not yet started the Wellbutrin that was prescribed at her last visit, but she did pick up the bottle and plans on starting later this week.     10/26/2022    3:36 PM 07/22/2022    3:29 PM 04/14/2022    3:35 PM  Depression screen PHQ 2/9  Decreased Interest 0 0 0  Down, Depressed, Hopeless 0 0 1  PHQ - 2 Score 0 0 1  Altered sleeping 3 3 3   Tired, decreased energy 3 3 3   Change in appetite 1 0 0  Feeling bad or failure about yourself  0 0 0  Trouble concentrating 3 3 3   Moving slowly or fidgety/restless 1 0 1  Suicidal thoughts 0 0 0  PHQ-9 Score 11 9 11   Difficult doing work/chores Somewhat difficult Somewhat difficult        10/26/2022    3:36 PM 07/22/2022    3:29 PM 04/14/2022    3:35 PM 10/29/2021    3:11 PM  GAD 7 : Generalized Anxiety Score  Nervous, Anxious, on Edge 1 2 3 1   Control/stop worrying 1 1 2 1   Worry too much - different things 1 1 2 1   Trouble relaxing 1 1 2 1   Restless 0 0 1 1  Easily annoyed or irritable 1 2 2 1   Afraid - awful might happen 0 0 0 1  Total GAD 7 Score 5 7 12 7   Anxiety Difficulty Somewhat difficult Somewhat difficult      Outpatient  Medications Prior to Visit  Medication Sig   Ascorbic Acid (VITAMIN C) 1000 MG tablet Take 1,000 mg by mouth daily.   buPROPion (WELLBUTRIN SR) 150 MG 12 hr tablet Take 1 tablet (150 mg total) by mouth daily.   Calcium Citrate-Vitamin D (CALCIUM CITRATE + PO) Take 1 tablet by mouth every morning.   cetirizine (ZYRTEC) 10 MG tablet Take 10 mg by mouth daily.   Cholecalciferol (VITAMIN D) 2000 UNITS tablet Take 4,000 Units by mouth daily.   clonazePAM (KLONOPIN) 0.5 MG tablet One once daily as needed for sleep/insomnia.   medroxyPROGESTERone Acetate 150 MG/ML SUSY Inject 1 mL (150 mg total) into the muscle every 11-12 weeks as directed   mesalamine (LIALDA) 1.2 g EC tablet Take 4 tablets (4.8 g total) by mouth daily with breakfast.   [DISCONTINUED] medroxyPROGESTERone Acetate 150 MG/ML SUSY Inject 1 mL (150 mg total) as directed every 3 (three) months.   [DISCONTINUED] medroxyPROGESTERone Acetate 150 MG/ML SUSY Inject 1 mL (150 mg total) into the muscle every 3 (three) months.   No facility-administered medications prior to visit.    ROS Negative unless otherwise noted in HPI  Objective:     BP 93/62 (BP Location: Left Arm, Patient Position: Sitting, Cuff Size: Normal)   Pulse 60   Resp 20   Ht 5\' 5"  (1.651 m)   Wt 134 lb (60.8 kg)   SpO2 100%   BMI 22.30 kg/m   Physical Exam Constitutional:      General: She is not in acute distress.    Appearance: Normal appearance.  HENT:     Head: Normocephalic and atraumatic.  Pulmonary:     Effort: Pulmonary effort is normal. No respiratory distress.  Musculoskeletal:     Cervical back: Normal range of motion.  Neurological:     General: No focal deficit present.     Mental Status: She is alert and oriented to person, place, and time. Mental status is at baseline.  Psychiatric:        Mood and Affect: Mood normal.        Thought Content: Thought content normal.        Judgment: Judgment normal.     Assessment & Plan:  Shift  work sleep disorder Assessment & Plan: Further reviewed sleep history, at this point recommend suvorexant (Belsomra) or mirtazapine.  Belsomra is specifically indicated for shiftwork sleep disorder, and mirtazapine would be beneficial for anxiety as well.  She is going to review the information on these 2 medications prior to her next follow-up appointment.  Until then, continue clonazepam 0.25 to 0.5 mg daily to sleep.  We also discussed potentially trying other sleep medicines.    Generalized anxiety disorder Assessment & Plan: GAD-7 score 5, improvement from 12 at last appointment.  Continue weekly therapy appointments.  May consider mirtazapine for anxiety and shiftwork sleep disorder.     Return in about 3 months (around 01/26/2023) for follow-up for sleep and mood.    Melida Quitter, PA

## 2022-10-26 NOTE — Patient Instructions (Signed)
With your schedule, I would recommend starting with 1 tablet of Wellbutrin first thing when you wake up.  This should help with focus and energy levels when you are awake.  If there is a day where you wake up and know that you will need to fall asleep less than 8-10 hours later, I would recommend skipping the Wellbutrin on those days.  In your packet from last time, the 2 medications that I would choose between for you are mirtazapine or Belsomra.  Mirtazapine is very helpful for racing thoughts, stress, anxiety in addition to helping you fall asleep.  Belsomra is one of the medications specifically recommended for night shift work for people who cannot take medications like Ambien or Lunesta.

## 2022-10-26 NOTE — Assessment & Plan Note (Signed)
Further reviewed sleep history, at this point recommend suvorexant (Belsomra) or mirtazapine.  Belsomra is specifically indicated for shiftwork sleep disorder, and mirtazapine would be beneficial for anxiety as well.  She is going to review the information on these 2 medications prior to her next follow-up appointment.  Until then, continue clonazepam 0.25 to 0.5 mg daily to sleep.  We also discussed potentially trying other sleep medicines.

## 2022-11-26 ENCOUNTER — Ambulatory Visit (INDEPENDENT_AMBULATORY_CARE_PROVIDER_SITE_OTHER): Payer: 59 | Admitting: Psychology

## 2022-11-26 ENCOUNTER — Other Ambulatory Visit (HOSPITAL_COMMUNITY): Payer: Self-pay

## 2022-11-26 ENCOUNTER — Other Ambulatory Visit: Payer: Self-pay

## 2022-11-26 DIAGNOSIS — F4322 Adjustment disorder with anxiety: Secondary | ICD-10-CM | POA: Diagnosis not present

## 2022-11-26 NOTE — Progress Notes (Signed)
  [facility] Counselor/Therapist Progress Note  Patient ID: Brianna Shaw, MRN: [id number],    Date: 11/26/2022  Time Spent: 60 mins    start time: 1500     end: 1600  Treatment Type: Individual Therapy  Reported Symptoms: Pt presented for today's session, via Caregility video.  Pt granted consent for the session, stating she is in her home with no one else present; pt understands the limits of virtual sessions.  I shared with pt that I am in my office with no one else here.  Mental Status Exam: Appearance:  Casual     Behavior: Appropriate  Motor: Normal  Speech/Language:  Clear and Coherent  Affect: Appropriate  Mood: normal  Thought process: normal  Thought content:   WNL  Sensory/Perceptual disturbances:   WNL  Orientation: oriented to person, place, and time/date  Attention: Good  Concentration: Good  Memory: WNL  Fund of knowledge:  Good  Insight:   Good  Judgment:  Good  Impulse Control: Good   Risk Assessment: Danger to Self:  No Self-injurious Behavior: No Danger to Others: No Duty to Warn:no Physical Aggression / Violence:No  Access to Firearms a concern: No  Gang Involvement:No   Subjective: Pt shares that "I have been sick for the past two weeks but I am finally getting better.  I never got to the doctor for it but I am starting to feel better.  I have seen Kathlene November ([place]) in [place], at a music [id number], and I went to [place] to see him and he is coming here next week."  Pt was not happy with the election results but is managing her feelings well.  Her mom has been in the hospital with pneumonia and then 2 wks in rehab and is back home now.  Pt also shares that Marchelle Folks (caretaker for her mom); pt has learned that her mom loaned Marchelle Folks money and pt is concerned about their relationship and how much money is missing from her mom's account.  Pt shares that her boss Florentina Addison) has a new boss, she is hoping that Florentina Addison will be more responsive.  Encouraged pt to  continue with her self care activities and we will meet in 3 weeks for a follow up session.  Interventions: Cognitive Behavioral Therapy  Diagnosis:Adjustment disorder with anxious mood  Plan: Treatment Plan Strengths/Abilities:  Intelligent, Intuitive, Willing to participate in therapy Treatment Preferences:  Outpatient Individual Therapy Statement of Needs:  Patient is to use CBT, mindfulness and coping skills to help manage and/or decrease symptoms associated with their diagnosis. Symptoms:  Depressed/Irritable mood, worry, social withdrawal Problems Addressed:  Depressive thoughts, Sadness, Sleep issues, etc. Long Term Goals:  Pt to reduce overall level, frequency, and intensity of the feelings of anxiety as evidenced by decreased irritability, negative self talk, and helpless feelings from 6 to 7 days/week to 0 to 1 days/week, per client report, for at least 3 consecutive months.  Progress: 10% Short Term Goals:  Pt to verbally express understanding of the relationship between feelings of anxiety and their impact on thinking patterns and behaviors.  Pt to verbalize an understanding of the role that distorted thinking plays in creating fears, excessive worry, and ruminations.  Progress: 10% Target Date:  02/26/2023 Frequency:  Bi-weekly Modality:  Cognitive Behavioral Therapy Interventions by Therapist:  Therapist will use CBT, Mindfulness exercises, Coping skills and Referrals, as needed by client. Client has verbally approved this treatment plan.  Karie Kirks, [facility]

## 2022-11-30 ENCOUNTER — Other Ambulatory Visit (HOSPITAL_COMMUNITY): Payer: Self-pay

## 2022-12-14 ENCOUNTER — Ambulatory Visit (INDEPENDENT_AMBULATORY_CARE_PROVIDER_SITE_OTHER): Payer: 59 | Admitting: Psychology

## 2022-12-14 DIAGNOSIS — F4322 Adjustment disorder with anxiety: Secondary | ICD-10-CM

## 2022-12-14 NOTE — Progress Notes (Signed)
  [facility] Counselor/Therapist Progress Note  Patient ID: Brianna Shaw, MRN: [id number],    Date: 12/14/2022  Time Spent: 60 mins    start time: 1400     end: 1500  Treatment Type: Individual Therapy  Reported Symptoms: Pt presented for today's session, via Caregility video.  Pt granted consent for the session, stating she is in her home with no one else present; pt understands the limits of virtual sessions.  I shared with pt that I am in my office with no one else here.  Mental Status Exam: Appearance:  Casual     Behavior: Appropriate  Motor: Normal  Speech/Language:  Clear and Coherent  Affect: Appropriate  Mood: normal  Thought process: normal  Thought content:   WNL  Sensory/Perceptual disturbances:   WNL  Orientation: oriented to person, place, and time/date  Attention: Good  Concentration: Good  Memory: WNL  Fund of knowledge:  Good  Insight:   Good  Judgment:  Good  Impulse Control: Good   Risk Assessment: Danger to Self:  No Self-injurious Behavior: No Danger to Others: No Duty to Warn:no Physical Aggression / Violence:No  Access to Firearms a concern: No  Gang Involvement:No   Subjective: Pt shares that "I have seen Kathlene November since our last session; he came down for the week before Thanksgiving.  He met several of my friends and my niece and everyone loved him.  He is even planning to come back and go with me to [place]'s and Brian's wedding next October.  We had a great time together."  Pt shares that Kathlene November works for [employer] and enjoys working there; he seems to be successful with them.  They are planning their times together around their jobs and that is a lot of work.  Pt continues to go to concerts regularly with friends and she enjoys that.  Pt shares that Marchelle Folks, the woman who had been helping pt's mom, has turned kind of sketchy so they are trying to distance themselves from her.  Encouraged pt to continue with her self care activities and  we will meet in 3 weeks for a follow up session.  Interventions: Cognitive Behavioral Therapy  Diagnosis:Adjustment disorder with anxious mood  Plan: Treatment Plan Strengths/Abilities:  Intelligent, Intuitive, Willing to participate in therapy Treatment Preferences:  Outpatient Individual Therapy Statement of Needs:  Patient is to use CBT, mindfulness and coping skills to help manage and/or decrease symptoms associated with their diagnosis. Symptoms:  Depressed/Irritable mood, worry, social withdrawal Problems Addressed:  Depressive thoughts, Sadness, Sleep issues, etc. Long Term Goals:  Pt to reduce overall level, frequency, and intensity of the feelings of anxiety as evidenced by decreased irritability, negative self talk, and helpless feelings from 6 to 7 days/week to 0 to 1 days/week, per client report, for at least 3 consecutive months.  Progress: 10% Short Term Goals:  Pt to verbally express understanding of the relationship between feelings of anxiety and their impact on thinking patterns and behaviors.  Pt to verbalize an understanding of the role that distorted thinking plays in creating fears, excessive worry, and ruminations.  Progress: 10% Target Date:  02/26/2023 Frequency:  Bi-weekly Modality:  Cognitive Behavioral Therapy Interventions by Therapist:  Therapist will use CBT, Mindfulness exercises, Coping skills and Referrals, as needed by client. Client has verbally approved this treatment plan.  Karie Kirks, [facility]

## 2023-01-07 ENCOUNTER — Ambulatory Visit (INDEPENDENT_AMBULATORY_CARE_PROVIDER_SITE_OTHER): Payer: 59 | Admitting: Psychology

## 2023-01-07 DIAGNOSIS — F411 Generalized anxiety disorder: Secondary | ICD-10-CM

## 2023-01-07 NOTE — Progress Notes (Signed)
[facility] Counselor/Therapist Progress Note  Patient ID: Brianna Shaw, MRN: [id number],    Date: 01/07/2023  Time Spent: 60 mins    start time: 1100     end: 1200  Treatment Type: Individual Therapy  Reported Symptoms: Pt presented for today's session, via Caregility video.  Pt granted consent for the session, stating she is in her home with no one else present; pt understands the limits of virtual sessions.  I shared with pt that I am in my office with no one else here.  Mental Status Exam: Appearance:  Casual     Behavior: Appropriate  Motor: Normal  Speech/Language:  Clear and Coherent  Affect: Appropriate  Mood: normal  Thought process: normal  Thought content:   WNL  Sensory/Perceptual disturbances:   WNL  Orientation: oriented to person, place, and time/date  Attention: Good  Concentration: Good  Memory: WNL  Fund of knowledge:  Good  Insight:   Good  Judgment:  Good  Impulse Control: Good   Risk Assessment: Danger to Self:  No Self-injurious Behavior: No Danger to Others: No Duty to Warn:no Physical Aggression / Violence:No  Access to Firearms a concern: No  Gang Involvement:No   Subjective: Pt shares that "I have been taking care of my mom this week because she has been sick this whole week.  Gaynelle Adu, me and mom had a sit down conversation about her will and who has what responsibilities regarding her mom."  They worked on trying to get her prescriptions all switched to [facility]; there is a little more work to do but pt feels good about getting that process moving.  Pt also tries to make sure she checks with her mom daily to be sure she takes her medication daily.  Gaynelle Adu is trying to take more of a role with their mom as well.  Pt shares her mom has a large binder with lots of paperwork in it; the latest healthcare POA shows pt's aunt as the POA and her mom needs to change it back to pt as POA so pt can help better take care of her mom.  Pt has been  trying to pay her mom's bills this week and has seen that her mom has paid over $900.00 this month to charities and has not paid her bills in December.  Her mom has been not feeling well this week and has been frustrated with everyone because of not feeling well.  Pt shares that she is cooking for the family this Sunday and she is looking forward to seeing people but not so much for having to do all the cooking.  Pt shares that Kathlene November just celebrated his daughters' 12 yo and 5 yo daughters birthdays and Christmas and is doing well.  Several members of his family texted her holiday messages and that was meaningful for her.  They will be seeing each other in Feb on Superbowl weekend and then he will come back here in March.  Encouraged pt to continue with her self care activities and we will meet in 3 weeks for a follow up session.  Interventions: Cognitive Behavioral Therapy  Diagnosis:Generalized anxiety disorder  Plan: Treatment Plan Strengths/Abilities:  Intelligent, Intuitive, Willing to participate in therapy Treatment Preferences:  Outpatient Individual Therapy Statement of Needs:  Patient is to use CBT, mindfulness and coping skills to help manage and/or decrease symptoms associated with their diagnosis. Symptoms:  Depressed/Irritable mood, worry, social withdrawal Problems Addressed:  Depressive thoughts, Sadness, Sleep issues, etc. Long  Term Goals:  Pt to reduce overall level, frequency, and intensity of the feelings of anxiety as evidenced by decreased irritability, negative self talk, and helpless feelings from 6 to 7 days/week to 0 to 1 days/week, per client report, for at least 3 consecutive months.  Progress: 10% Short Term Goals:  Pt to verbally express understanding of the relationship between feelings of anxiety and their impact on thinking patterns and behaviors.  Pt to verbalize an understanding of the role that distorted thinking plays in creating fears, excessive worry, and  ruminations.  Progress: 10% Target Date:  02/26/2023 Frequency:  Bi-weekly Modality:  Cognitive Behavioral Therapy Interventions by Therapist:  Therapist will use CBT, Mindfulness exercises, Coping skills and Referrals, as needed by client. Client has verbally approved this treatment plan.  Karie Kirks, [facility]

## 2023-01-27 ENCOUNTER — Ambulatory Visit: Payer: Commercial Managed Care - PPO | Admitting: Psychology

## 2023-01-27 ENCOUNTER — Ambulatory Visit (INDEPENDENT_AMBULATORY_CARE_PROVIDER_SITE_OTHER): Payer: Commercial Managed Care - PPO | Admitting: Family Medicine

## 2023-01-27 VITALS — BP 118/72 | HR 69 | Ht 65.0 in | Wt 135.8 lb

## 2023-01-27 DIAGNOSIS — F4322 Adjustment disorder with anxiety: Secondary | ICD-10-CM

## 2023-01-27 DIAGNOSIS — F411 Generalized anxiety disorder: Secondary | ICD-10-CM

## 2023-01-27 DIAGNOSIS — G4726 Circadian rhythm sleep disorder, shift work type: Secondary | ICD-10-CM

## 2023-01-27 DIAGNOSIS — Z1159 Encounter for screening for other viral diseases: Secondary | ICD-10-CM | POA: Diagnosis not present

## 2023-01-27 DIAGNOSIS — N951 Menopausal and female climacteric states: Secondary | ICD-10-CM | POA: Diagnosis not present

## 2023-01-27 NOTE — Assessment & Plan Note (Signed)
GAD-7 score 5, stable from last appointment.  Continue weekly therapy appointments.  May consider buspirone or SSRI for anxiety.

## 2023-01-27 NOTE — Assessment & Plan Note (Addendum)
Previously, she has tried Ambien, trazodone, mirtazapine, Seroquel, Benadryl, Unisom.  Each one was either ineffective or caused unbearable side effects. At this point recommend suvorexant (Belsomra).  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  If she is open to it, recommend starting at half the normal starting dose because she has been very sensitive to sleep medications in the past.  Until then, continue clonazepam 0.25 to 0.5 mg on days that she has night shift to sleep.  She has also had colleagues recommend modafinil (Provigil) and she states that she may consider that at some point.

## 2023-01-27 NOTE — Patient Instructions (Signed)
SHIFT WORK/SLEEP: -The medicine that I mentioned that is specifically for sleep work to help you fall asleep and stay asleep is called suvorexant (Belsomra).  I have included some additional information if you want to read more about it. -The other option that you mentioned Provigil (modafinil) is more so to help you stay awake during your shifts and is taken first thing after waking up. We can talk about either of these options more at your next appointment depending on where you are at.  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  This does change the chemical composition in your brain, so often times it can be helpful to give you a boost of serotonin.  This is something that can be short-term or long-term option.  Going to therapy at the same time as taking medicine has been shown to be very effective, so if you do decide that giving some extra support with medication would be beneficial just let us know!

## 2023-01-27 NOTE — Progress Notes (Signed)
Brianna Shaw Progress Note  Patient ID: Brianna Shaw, MRN: [id number],    Date: 01/27/2023  Time Spent: 60 mins    start time: 1100     end: 1200  Treatment Type: Individual Therapy  Reported Symptoms: Pt presented for today's session, via Caregility video.  Pt granted consent for the session, stating she is in her home with no one else present; pt understands the limits of virtual sessions.  I shared with pt that I am in my office with no one else here.  Mental Status Exam: Appearance:  Casual     Behavior: Appropriate  Motor: Normal  Speech/Language:  Clear and Coherent  Affect: Appropriate  Mood: normal  Thought process: normal  Thought content:   WNL  Sensory/Perceptual disturbances:   WNL  Orientation: oriented to person, place, and time/date  Attention: Good  Concentration: Good  Memory: WNL  Fund of knowledge:  Good  Insight:   Good  Judgment:  Good  Impulse Control: Good   Risk Assessment: Danger to Self:  No Self-injurious Behavior: No Danger to Others: No Duty to Warn:no Physical Aggression / Violence:No  Access to Firearms a concern: No  Gang Involvement:No   Subjective: Pt shares that "I have been working at [employer] lately and it has been OK.  We had a staff meeting last week and I thought I was muted and was talking to myself and it turned out that my supervisor could hear what I was saying only to myself.  I felt terrible that she heard it but I did mean what I had said."  Pt has friends from [facility] coming tomorrow for a couple of concerts this weekend and she is looking forward to that.  She also bought herself a new sectional sofa and she is very happy for this development.  Pt has an appt with her PCP this afternoon and she feels weird about not having taken the Wellbutrin that the PCP prescribed for her.  We talked about ways to talk with the PCP about having not started the medication.  Pt shares that her mom is  feeling a little better; she just finished a course of antibiotics.  Pt took her mom and her mom's friend from out of town to get their nails done yesterday and pt shares that they all enjoyed the outing.  Pt shares that her mom did go to the attorney's office to get pt and Gaynelle Adu shown as her healthcare power of attorney and her other legal documents.  Pt and several friends took her mom out last night for dinner for her birthday and that was fun for everyone.  Pt shares that the family Christmas dinner was fun for her and everyone else.  Pt shares that she enjoyed the whole group who came.  Pt continues to talk with Kathlene November daily and  they will be seeing each other in Feb on Superbowl weekend and then he will come back here in March.  Encouraged pt to continue with her self care activities and we will meet in 3 weeks for a follow up session.  Interventions: Cognitive Behavioral Therapy  Diagnosis:Adjustment disorder with anxious mood  Plan: Treatment Plan Strengths/Abilities:  Intelligent, Intuitive, Willing to participate in therapy Treatment Preferences:  Outpatient Individual Therapy Statement of Needs:  Patient is to use CBT, mindfulness and coping skills to help manage and/or decrease symptoms associated with their diagnosis. Symptoms:  Depressed/Irritable mood, worry, social withdrawal Problems Addressed:  Depressive thoughts,  Sadness, Sleep issues, etc. Long Term Goals:  Pt to reduce overall level, frequency, and intensity of the feelings of anxiety as evidenced by decreased irritability, negative self talk, and helpless feelings from 6 to 7 days/week to 0 to 1 days/week, per client report, for at least 3 consecutive months.  Progress: 10% Short Term Goals:  Pt to verbally express understanding of the relationship between feelings of anxiety and their impact on thinking patterns and behaviors.  Pt to verbalize an understanding of the role that distorted thinking plays in creating fears, excessive  worry, and ruminations.  Progress: 10% Target Date:  02/26/2023 Frequency:  Bi-weekly Modality:  Cognitive Behavioral Therapy Interventions by Therapist:  Therapist will use CBT, Mindfulness exercises, Coping skills and Referrals, as needed by client. Client has verbally approved this treatment plan.  Karie Kirks, [facility]

## 2023-01-27 NOTE — Progress Notes (Signed)
Established Patient Office Visit  Subjective   Patient ID: Brianna Shaw, female    DOB: 05/10/1980  Age: 43 y.o. MRN: [id number]  Chief Complaint  Patient presents with   Sleep Management   Follow Up Mood    HPI Brianna Shaw is a 43 y.o. female presenting today for follow up of sleep, mood. 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  Her current regimen includes Klonopin, melatonin, magnesium glycinate.  Previously, she has tried Ambien, trazodone, mirtazapine, Seroquel, Benadryl, Unisom.  Each one was either ineffective or caused unbearable side effects. Mood: Patient is here to follow up for anxiety, currently managing with therapy. 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  She is not sure if she would like to start any medication to support her further at this time, but she does know that it is an option.  For right now, she would like to continue with her current routine.  She also feels that it would be beneficial to be evaluated for ADHD at some point.     01/27/2023    3:31 PM 01/27/2023    3:30 PM 10/26/2022    3:36 PM  Depression screen PHQ 2/9  Decreased Interest  0 0  Down, Depressed, Hopeless  0 0  PHQ - 2 Score  0 0  Altered sleeping  3 3  Tired, decreased energy  3 3  Change in appetite  0 1  Feeling bad or failure about yourself   0 0  Trouble concentrating  2 3  Moving slowly or fidgety/restless  0 1  Suicidal thoughts  0 0  PHQ-9 Score  8 11  Difficult doing work/chores Somewhat difficult  Somewhat difficult       01/27/2023    3:31 PM 10/26/2022    3:36 PM 07/22/2022    3:29 PM 04/14/2022    3:35 PM  GAD 7 : Generalized Anxiety Score  Nervous, Anxious, on Edge 1 1 2 3   Control/stop worrying 1 1 1 2   Worry too much - different things 1 1 1 2   Trouble relaxing 1 1 1 2   Restless 0 0 0 1  Easily annoyed or irritable 1 1 2 2   Afraid - awful might happen 0 0 0 0  Total GAD 7 Score 5  5 7 12   Anxiety Difficulty Somewhat difficult Somewhat difficult Somewhat difficult      Outpatient Medications Prior to Visit  Medication Sig   Ascorbic Acid (VITAMIN C) 1000 MG tablet Take 1,000 mg by mouth daily.   Calcium Citrate-Vitamin D (CALCIUM CITRATE + PO) Take 1 tablet by mouth every morning.   cetirizine (ZYRTEC) 10 MG tablet Take 10 mg by mouth daily.   Cholecalciferol (VITAMIN D) 2000 UNITS tablet Take 4,000 Units by mouth daily.   clonazePAM (KLONOPIN) 0.5 MG tablet One once daily as needed for sleep/insomnia.   medroxyPROGESTERone Acetate 150 MG/ML SUSY Inject 1 mL (150 mg total) into the muscle every 11-12 weeks as directed   mesalamine (LIALDA) 1.2 g EC tablet Take 4 tablets (4.8 g total) by mouth daily with breakfast.   [DISCONTINUED] buPROPion (WELLBUTRIN SR) 150 MG 12 hr tablet Take 1 tablet (150 mg total) by mouth daily.   No facility-administered medications prior to visit.    ROS Negative unless otherwise noted in HPI   Objective:     BP 118/72   Pulse 69  Ht 5\' 5"  (1.651 m)   Wt 135 lb 12 oz (61.6 kg)   SpO2 99%   BMI 22.59 kg/m   Physical Exam Constitutional:      General: She is not in acute distress.    Appearance: Normal appearance.  HENT:     Head: Normocephalic and atraumatic.  Pulmonary:     Effort: Pulmonary effort is normal. No respiratory distress.  Musculoskeletal:     Cervical back: Normal range of motion.  Neurological:     General: No focal deficit present.     Mental Status: She is alert and oriented to person, place, and time. Mental status is at baseline.  Psychiatric:        Mood and Affect: Mood normal.        Thought Content: Thought content normal.        Judgment: Judgment normal.      Assessment & Plan:  Shift work sleep disorder Assessment & Plan: Previously, she has tried Ambien, trazodone, mirtazapine, Seroquel, Benadryl, Unisom.  Each one was either ineffective or caused unbearable side effects. At this point  recommend suvorexant (Belsomra).  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  If she is open to it, recommend starting at half the normal starting dose because she has been very sensitive to sleep medications in the past.  Until then, continue clonazepam 0.25 to 0.5 mg on days that she has night shift to sleep.  She has also had colleagues recommend modafinil (Provigil) and she states that she may consider that at some point.   Generalized anxiety disorder Assessment & Plan: GAD-7 score 5, stable from last appointment.  Continue weekly therapy appointments.  May consider buspirone or SSRI for anxiety.   Perimenopausal symptoms -     FSH/LH; Future  Screening for viral disease -     Hepatitis C antibody; Future -     HIV Antibody (routine testing w rflx); Future    Return in about 3 months (around 04/27/2023) for follow-up for sleep and mood, shift work.    Melida Quitter, PA

## 2023-02-09 ENCOUNTER — Other Ambulatory Visit (INDEPENDENT_AMBULATORY_CARE_PROVIDER_SITE_OTHER): Payer: Commercial Managed Care - PPO

## 2023-02-09 DIAGNOSIS — N951 Menopausal and female climacteric states: Secondary | ICD-10-CM | POA: Diagnosis not present

## 2023-02-09 DIAGNOSIS — Z1159 Encounter for screening for other viral diseases: Secondary | ICD-10-CM

## 2023-02-09 DIAGNOSIS — Z23 Encounter for immunization: Secondary | ICD-10-CM

## 2023-02-09 NOTE — Addendum Note (Signed)
Addended by: Thad Ranger on: 02/09/2023 09:12 AM   Modules accepted: Orders

## 2023-02-10 ENCOUNTER — Encounter: Payer: Self-pay | Admitting: Family Medicine

## 2023-02-10 DIAGNOSIS — G4726 Circadian rhythm sleep disorder, shift work type: Secondary | ICD-10-CM

## 2023-02-10 LAB — HIV ANTIBODY (ROUTINE TESTING W REFLEX): HIV Screen 4th Generation wRfx: NONREACTIVE

## 2023-02-10 LAB — FSH/LH
FSH: 28.8 m[IU]/mL
LH: 20.4 m[IU]/mL

## 2023-02-10 LAB — HEPATITIS C ANTIBODY: Hep C Virus Ab: NONREACTIVE

## 2023-02-17 ENCOUNTER — Ambulatory Visit: Payer: Commercial Managed Care - PPO | Admitting: Psychology

## 2023-02-17 ENCOUNTER — Encounter: Payer: Self-pay | Admitting: Family Medicine

## 2023-02-17 DIAGNOSIS — F4322 Adjustment disorder with anxiety: Secondary | ICD-10-CM | POA: Diagnosis not present

## 2023-02-17 NOTE — Progress Notes (Signed)
  [facility] Counselor/Therapist Progress Note  Patient ID: Brianna Shaw, MRN: [id number],    Date: 02/17/2023  Time Spent: 60 mins    start time: 1500     end: 1600  Treatment Type: Individual Therapy  Reported Symptoms: Pt presented for today's session, via Caregility video.  Pt granted consent for the session, stating she is in her home with no one else present; pt understands the limits of virtual sessions.  I shared with pt that I am in my office with no one else here.  Mental Status Exam: Appearance:  Casual     Behavior: Appropriate  Motor: Normal  Speech/Language:  Clear and Coherent  Affect: Appropriate  Mood: normal  Thought process: normal  Thought content:   WNL  Sensory/Perceptual disturbances:   WNL  Orientation: oriented to person, place, and time/date  Attention: Good  Concentration: Good  Memory: WNL  Fund of knowledge:  Good  Insight:   Good  Judgment:  Good  Impulse Control: Good   Risk Assessment: Danger to Self:  No Self-injurious Behavior: No Danger to Others: No Duty to Warn:no Physical Aggression / Violence:No  Access to Firearms a concern: No  Gang Involvement:No   Subjective: Pt shares that I am really tired.  I haven't worked since Sunday night but my mom has been sick and I had to take her to the doctor.  She was running a fever and is now on antibiotics; she seems to be feeling better now.  Pt shares that she is leaving at [facility] tomorrow and will be coming back next Wednesday; she is going to get to see Garrel for the week.  Pt shares she saw her PCP who wanted her to start Wellbutrin ; she asked the doctor to check her hormone levels again.  Pt has been more emotional and has been sweating like crazy.  All of my lab results indicated pre-menopausal conditions.  Now the question is what do we do about it.  Pt shares that she is not sure how this is impacting her and she knows it will have some sort of impact on her relationship with  Garrel.  Encouraged pt to continue with her self care activities and we will meet in 3 weeks for a follow up session.  Interventions: Cognitive Behavioral Therapy  Diagnosis:Adjustment disorder with anxious mood  Plan: Treatment Plan Strengths/Abilities:  Intelligent, Intuitive, Willing to participate in therapy Treatment Preferences:  Outpatient Individual Therapy Statement of Needs:  Patient is to use CBT, mindfulness and coping skills to help manage and/or decrease symptoms associated with their diagnosis. Symptoms:  Depressed/Irritable mood, worry, social withdrawal Problems Addressed:  Depressive thoughts, Sadness, Sleep issues, etc. Long Term Goals:  Pt to reduce overall level, frequency, and intensity of the feelings of anxiety as evidenced by decreased irritability, negative self talk, and helpless feelings from 6 to 7 days/week to 0 to 1 days/week, per client report, for at least 3 consecutive months.  Progress: 10% Short Term Goals:  Pt to verbally express understanding of the relationship between feelings of anxiety and their impact on thinking patterns and behaviors.  Pt to verbalize an understanding of the role that distorted thinking plays in creating fears, excessive worry, and ruminations.  Progress: 10% Target Date:  02/26/2023 Frequency:  Bi-weekly Modality:  Cognitive Behavioral Therapy Interventions by Therapist:  Therapist will use CBT, Mindfulness exercises, Coping skills and Referrals, as needed by client. Client has verbally approved this treatment plan.  Francis KATHEE Macintosh, [facility]

## 2023-03-03 ENCOUNTER — Other Ambulatory Visit (HOSPITAL_COMMUNITY): Payer: Self-pay

## 2023-03-10 ENCOUNTER — Ambulatory Visit: Payer: Commercial Managed Care - PPO | Admitting: Psychology

## 2023-03-10 DIAGNOSIS — F4322 Adjustment disorder with anxiety: Secondary | ICD-10-CM | POA: Diagnosis not present

## 2023-03-10 NOTE — Progress Notes (Addendum)
 [facility] Counselor/Therapist Progress Note  Patient ID: Brianna Shaw, MRN: [id number],    Date: 03/10/2023  Time Spent: 60 mins    start time: 1300     end: 1400  Treatment Type: Individual Therapy  Reported Symptoms: Pt presented for today's session, via Caregility video.  Pt granted consent for the session, stating she is in her home with no one else present; pt understands the limits of virtual sessions.  I shared with pt that I am in my office with no one else here.  Mental Status Exam: Appearance:  Casual     Behavior: Appropriate  Motor: Normal  Speech/Language:  Clear and Coherent  Affect: Appropriate  Mood: normal  Thought process: normal  Thought content:   WNL  Sensory/Perceptual disturbances:   WNL  Orientation: oriented to person, place, and time/date  Attention: Good  Concentration: Good  Memory: WNL  Fund of knowledge:  Good  Insight:   Good  Judgment:  Good  Impulse Control: Good   Risk Assessment: Danger to Self:  No Self-injurious Behavior: No Danger to Others: No Duty to Warn:no Physical Aggression / Violence:No  Access to Firearms a concern: No  Gang Involvement:No   Subjective: Pt shares that "I went to a concert last night and I am going to a [employer].  Kathlene November and I broke up while we were in [place] together.  We had a great time in [place] with lots of my friends and everyone loved him.  We then went to [place] just ourselves and we were talking while we were drinking and he was pissed that I was not willing to even think about moving to [place]."  Pt shares that her mom was admitted to the hospital while pt was gone with Kathlene November; she had pneumonia.  Pt shares that she talked with Kathlene November about her peri-menopause symptoms and he did not seem to be interested about what she was saying.  Pt is not sure why Kathlene November flipped out so badly while they were discussing the issue and started yelling at pt and dropping F-bombs.  Pt shares "I  was not having that."  Pt took an Benedetto Goad to the airport and did not talk to him the rest of that day.  He sent her a mean text at 230 am a couple of days later and blocked her on all his social media.  She is beginning to tell her friends that they broke up.  Pt shares her health is still of concern for pt.  Pt shares she has her annual evaluation coming up next week and she is uncomfortable with that upcoming meeting.  Encouraged pt to continue with her self care activities and we will meet in 3 weeks for a follow up session.  Interventions: Cognitive Behavioral Therapy  Diagnosis:Adjustment disorder with anxious mood  Plan: Treatment Plan Strengths/Abilities:  Intelligent, Intuitive, Willing to participate in therapy Treatment Preferences:  Outpatient Individual Therapy Statement of Needs:  Patient is to use CBT, mindfulness and coping skills to help manage and/or decrease symptoms associated with their diagnosis. Symptoms:  Depressed/Irritable mood, worry, social withdrawal Problems Addressed:  Depressive thoughts, Sadness, Sleep issues, etc. Long Term Goals:  Pt to reduce overall level, frequency, and intensity of the feelings of anxiety as evidenced by decreased irritability, negative self talk, and helpless feelings from 6 to 7 days/week to 0 to 1 days/week, per client report, for at least 3 consecutive months.  Progress: 10% Short Term Goals:  Pt to  verbally express understanding of the relationship between feelings of anxiety and their impact on thinking patterns and behaviors.  Pt to verbalize an understanding of the role that distorted thinking plays in creating fears, excessive worry, and ruminations.  Progress: 10% Target Date:  02/26/2024 Frequency:  Bi-weekly Modality:  Cognitive Behavioral Therapy Interventions by Therapist:  Therapist will use CBT, Mindfulness exercises, Coping skills and Referrals, as needed by client. Client has verbally approved this treatment plan.  Karie Kirks, [facility]

## 2023-03-17 ENCOUNTER — Other Ambulatory Visit: Payer: Self-pay | Admitting: Family Medicine

## 2023-03-17 ENCOUNTER — Other Ambulatory Visit (HOSPITAL_COMMUNITY): Payer: Self-pay

## 2023-03-17 DIAGNOSIS — G4726 Circadian rhythm sleep disorder, shift work type: Secondary | ICD-10-CM

## 2023-03-17 MED ORDER — CLONAZEPAM 0.5 MG PO TABS
0.5000 mg | ORAL_TABLET | Freq: Every day | ORAL | 1 refills | Status: DC | PRN
  Filled 2023-03-17: qty 30, 30d supply, fill #0
  Filled 2023-06-01: qty 30, 30d supply, fill #1

## 2023-03-17 NOTE — Addendum Note (Signed)
 Addended by: Saralyn Pilar on: 03/17/2023 11:31 AM   Modules accepted: Orders

## 2023-03-17 NOTE — Telephone Encounter (Signed)
 Copied from CRM [id number]. Topic: Clinical - Medication Refill >> Mar 17, 2023 11:03 AM Emylou G wrote: Most Recent Primary Care Visit:  Provider: [facility]  Department: [facility]  Visit Type: LAB VISIT  Date: 02/09/2023  Medication: clonazePAM (KLONOPIN) 0.5 MG tablet  Has the patient contacted their pharmacy? Yes (Agent: If no, request that the patient contact the pharmacy for the refill. If patient does not wish to contact the pharmacy document the reason why and proceed with request.) (Agent: If yes, when and what did the pharmacy advise?) adv her to go through [facility]  Is this the correct pharmacy for this prescription? Yes If no, delete pharmacy and type the correct one.  This is the patient's preferred pharmacy:  [facility] [address]   Has the prescription been filled recently? No  Is the patient out of the medication? Yes has 4 left  Has the patient been seen for an appointment in the last year OR does the patient have an upcoming appointment? Yes  Can we respond through MyChart? Yesn  Agent: Please be advised that Rx refills may take up to 3 business days. We ask that you follow-up with your pharmacy.

## 2023-03-17 NOTE — Telephone Encounter (Signed)
Refilled today in a separate encounter

## 2023-03-23 ENCOUNTER — Ambulatory Visit: Payer: Commercial Managed Care - PPO | Admitting: Psychology

## 2023-03-23 DIAGNOSIS — E669 Obesity, unspecified: Secondary | ICD-10-CM | POA: Diagnosis not present

## 2023-03-23 DIAGNOSIS — N951 Menopausal and female climacteric states: Secondary | ICD-10-CM | POA: Diagnosis not present

## 2023-03-23 DIAGNOSIS — R5383 Other fatigue: Secondary | ICD-10-CM | POA: Diagnosis not present

## 2023-04-01 ENCOUNTER — Telehealth: Admitting: Physician Assistant

## 2023-04-01 ENCOUNTER — Ambulatory Visit: Admitting: Psychology

## 2023-04-01 DIAGNOSIS — J019 Acute sinusitis, unspecified: Secondary | ICD-10-CM | POA: Diagnosis not present

## 2023-04-01 DIAGNOSIS — F4322 Adjustment disorder with anxiety: Secondary | ICD-10-CM

## 2023-04-01 DIAGNOSIS — B9689 Other specified bacterial agents as the cause of diseases classified elsewhere: Secondary | ICD-10-CM

## 2023-04-01 MED ORDER — AMOXICILLIN-POT CLAVULANATE 875-125 MG PO TABS: 1.0000 | ORAL_TABLET | Freq: Two times a day (BID) | ORAL | 0 refills | Status: DC
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
## 2023-04-01 NOTE — Progress Notes (Signed)

## 2023-04-14 DIAGNOSIS — N959 Unspecified menopausal and perimenopausal disorder: Secondary | ICD-10-CM | POA: Diagnosis not present

## 2023-04-15 ENCOUNTER — Other Ambulatory Visit (HOSPITAL_COMMUNITY): Payer: Self-pay

## 2023-04-15 MED ORDER — ESTRADIOL 0.05 MG/24HR TD PTTW
1.0000 | MEDICATED_PATCH | TRANSDERMAL | 1 refills | Status: AC
  Filled 2023-04-15: qty 16, 56d supply, fill #0
  Filled 2023-04-19 (×2): qty 8, 28d supply, fill #0
  Filled 2023-05-15: qty 8, 28d supply, fill #1
  Filled 2023-06-16: qty 8, 28d supply, fill #2
  Filled 2023-07-18: qty 8, 28d supply, fill #3
  Filled 2023-08-08: qty 8, 28d supply, fill #4
  Filled 2023-09-08: qty 8, 28d supply, fill #5

## 2023-04-19 ENCOUNTER — Other Ambulatory Visit (HOSPITAL_COMMUNITY): Payer: Self-pay

## 2023-04-22 ENCOUNTER — Other Ambulatory Visit (HOSPITAL_COMMUNITY): Payer: Self-pay

## 2023-04-26 ENCOUNTER — Other Ambulatory Visit (HOSPITAL_COMMUNITY): Payer: Self-pay

## 2023-05-16 ENCOUNTER — Other Ambulatory Visit: Payer: Self-pay

## 2023-05-17 ENCOUNTER — Other Ambulatory Visit (HOSPITAL_COMMUNITY): Payer: Self-pay

## 2023-05-17 ENCOUNTER — Ambulatory Visit (INDEPENDENT_AMBULATORY_CARE_PROVIDER_SITE_OTHER): Admitting: Psychology

## 2023-05-17 DIAGNOSIS — F4322 Adjustment disorder with anxiety: Secondary | ICD-10-CM | POA: Diagnosis not present

## 2023-05-17 NOTE — Progress Notes (Signed)
 [facility] Counselor/Therapist Progress Note  Patient ID: Brianna Shaw, MRN: [id number],    Date: 05/17/2023  Time Spent: 60 mins    start time: 1400     end: 1500  Treatment Type: Individual Therapy  Reported Symptoms: Pt presented for today's session, via Caregility video.  Pt granted consent for the session, stating she is in her home with no one else present; pt understands the limits of virtual sessions.  I shared with pt that I am in my office with no one else here.  Mental Status Exam: Appearance:  Casual     Behavior: Appropriate  Motor: Normal  Speech/Language:  Clear and Coherent  Affect: Appropriate  Mood: normal  Thought process: normal  Thought content:   WNL  Sensory/Perceptual disturbances:   WNL  Orientation: oriented to person, place, and time/date  Attention: Good  Concentration: Good  Memory: WNL  Fund of knowledge:  Good  Insight:   Good  Judgment:  Good  Impulse Control: Good   Risk Assessment: Danger to Self:  No Self-injurious Behavior: No Danger to Others: No Duty to Warn:no Physical Aggression / Violence:No  Access to Firearms a concern: No  Gang Involvement:No   Subjective: Pt shares that "I am doing pretty well since our last session.  I am still planning to go with Arlene Ben and Bonnell Butcher to [place] in June; I am hoping that they don't back out on me."  Pt shares that [name] still has her blocked on social media and that is making it easier for her to move forward.  Pt shares that she is still planning to go to [place]'s wedding in October.  She shares that there is a nurse she knows Loetta Ringer) who lives in [place] and [place] met [name] through pt; they go to music festivals together.  Loetta Ringer has been acting distant lately; she has cancelled a couple of their events recently.  Pt shares that Loetta Ringer and [name] are going to a festival together in [place] and let it slip to pt recently.  Pt decided to talk with Loetta Ringer about how pt was feeling (by text); she did  get a IG message from [place] in response to pt's text but it was not clear for pt what the message was.  Encouraged pt to continue to try to clarify with Loetta Ringer what is going on with her and why things feel so strained for them.  Encouraged pt to continue with her self care activities and we will meet in 3 weeks for a follow up session.  Interventions: Cognitive Behavioral Therapy  Diagnosis:Adjustment disorder with anxious mood  Plan: Treatment Plan Strengths/Abilities:  Intelligent, Intuitive, Willing to participate in therapy Treatment Preferences:  Outpatient Individual Therapy Statement of Needs:  Patient is to use CBT, mindfulness and coping skills to help manage and/or decrease symptoms associated with their diagnosis. Symptoms:  Depressed/Irritable mood, worry, social withdrawal Problems Addressed:  Depressive thoughts, Sadness, Sleep issues, etc. Long Term Goals:  Pt to reduce overall level, frequency, and intensity of the feelings of anxiety as evidenced by decreased irritability, negative self talk, and helpless feelings from 6 to 7 days/week to 0 to 1 days/week, per client report, for at least 3 consecutive months.  Progress: 10% Short Term Goals:  Pt to verbally express understanding of the relationship between feelings of anxiety and their impact on thinking patterns and behaviors.  Pt to verbalize an understanding of the role that distorted thinking plays in creating fears, excessive worry, and ruminations.  Progress:  10% Target Date:  02/26/2024 Frequency:  Bi-weekly Modality:  Cognitive Behavioral Therapy Interventions by Therapist:  Therapist will use CBT, Mindfulness exercises, Coping skills and Referrals, as needed by client. Client has verbally approved this treatment plan.  Jhonny Moss, [facility]

## 2023-05-30 DIAGNOSIS — M25562 Pain in left knee: Secondary | ICD-10-CM | POA: Diagnosis not present

## 2023-06-02 ENCOUNTER — Other Ambulatory Visit: Payer: Self-pay

## 2023-06-02 DIAGNOSIS — M25562 Pain in left knee: Secondary | ICD-10-CM | POA: Diagnosis not present

## 2023-06-08 ENCOUNTER — Ambulatory Visit (INDEPENDENT_AMBULATORY_CARE_PROVIDER_SITE_OTHER): Admitting: Psychology

## 2023-06-08 DIAGNOSIS — F4322 Adjustment disorder with anxiety: Secondary | ICD-10-CM

## 2023-06-08 DIAGNOSIS — M25362 Other instability, left knee: Secondary | ICD-10-CM | POA: Diagnosis not present

## 2023-06-08 DIAGNOSIS — M25562 Pain in left knee: Secondary | ICD-10-CM | POA: Diagnosis not present

## 2023-06-08 NOTE — Progress Notes (Signed)
  [facility] Counselor/Therapist Progress Note  Patient ID: Brianna Shaw, MRN: [id number],    Date: 06/08/2023  Time Spent: 60 mins    start time: 1500     end: 1600  Treatment Type: Individual Therapy  Reported Symptoms: Pt presented for today's session, via Caregility video.  Pt granted consent for the session, stating she is in her home with no one else present; pt understands the limits of virtual sessions.  I shared with pt that I am in my office with no one else here.  Mental Status Exam: Appearance:  Casual     Behavior: Appropriate  Motor: Normal  Speech/Language:  Clear and Coherent  Affect: Appropriate  Mood: normal  Thought process: normal  Thought content:   WNL  Sensory/Perceptual disturbances:   WNL  Orientation: oriented to person, place, and time/date  Attention: Good  Concentration: Good  Memory: WNL  Fund of knowledge:  Good  Insight:   Good  Judgment:  Good  Impulse Control: Good   Risk Assessment: Danger to Self:  No Self-injurious Behavior: No Danger to Others: No Duty to Warn:no Physical Aggression / Violence:No  Access to Firearms a concern: No  Gang Involvement:No   Subjective: Pt shares that "I messed up my left knee a week and a half ago and just saw the ortho today.  I am going to have to have to have surgery, probably later in June or July but I am worried because we do not get PTO .  This is the worst injury I have ever gotten at a concert.  This is likely to mess up my trip to [place] in August."  Pt shares that her brother, Brianna Shaw, just had his 6th surgery on a tendon in his biceps.  Pt has had to find a person to help clean for her mom because pt can't do it now.  Pt has still not spoken or had any interaction with Brianna Shaw since our last session.  Encouraged pt to continue with her self care activities and we will meet in 3 weeks for a follow up session.  Interventions: Cognitive Behavioral Therapy  Diagnosis:Adjustment disorder  with anxious mood  Plan: Treatment Plan Strengths/Abilities:  Intelligent, Intuitive, Willing to participate in therapy Treatment Preferences:  Outpatient Individual Therapy Statement of Needs:  Patient is to use CBT, mindfulness and coping skills to help manage and/or decrease symptoms associated with their diagnosis. Symptoms:  Depressed/Irritable mood, worry, social withdrawal Problems Addressed:  Depressive thoughts, Sadness, Sleep issues, etc. Long Term Goals:  Pt to reduce overall level, frequency, and intensity of the feelings of anxiety as evidenced by decreased irritability, negative self talk, and helpless feelings from 6 to 7 days/week to 0 to 1 days/week, per client report, for at least 3 consecutive months.  Progress: 10% Short Term Goals:  Pt to verbally express understanding of the relationship between feelings of anxiety and their impact on thinking patterns and behaviors.  Pt to verbalize an understanding of the role that distorted thinking plays in creating fears, excessive worry, and ruminations.  Progress: 10% Target Date:  02/26/2024 Frequency:  Bi-weekly Modality:  Cognitive Behavioral Therapy Interventions by Therapist:  Therapist will use CBT, Mindfulness exercises, Coping skills and Referrals, as needed by client. Client has verbally approved this treatment plan.  Jhonny Moss, [facility]

## 2023-06-09 DIAGNOSIS — M25562 Pain in left knee: Secondary | ICD-10-CM | POA: Diagnosis not present

## 2023-06-16 DIAGNOSIS — M25562 Pain in left knee: Secondary | ICD-10-CM | POA: Diagnosis not present

## 2023-06-18 ENCOUNTER — Other Ambulatory Visit (HOSPITAL_COMMUNITY): Payer: Self-pay

## 2023-06-22 DIAGNOSIS — M25562 Pain in left knee: Secondary | ICD-10-CM | POA: Diagnosis not present

## 2023-06-28 ENCOUNTER — Ambulatory Visit: Admitting: Psychology

## 2023-06-30 ENCOUNTER — Encounter (HOSPITAL_COMMUNITY): Payer: Self-pay

## 2023-06-30 NOTE — Progress Notes (Addendum)
 PCP - Nellie Banas PA-C Cardiologist -   PPM/ICD -  Device Orders -  Rep Notified -   Chest x-ray -  EKG -  Stress Test -  ECHO -  Cardiac Cath -   Sleep Study -  CPAP -   Fasting Blood Sugar -  Checks Blood Sugar _____ times a day  Blood Thinner Instructions: Aspirin  Instructions:  ERAS Protcol - PRE-SURGERY Ensure     COVID vaccine -  Activity-- Anesthesia review: Ulcerative colitis, Lyme disease  Patient denies shortness of breath, fever, cough and chest pain at PAT appointment   All instructions explained to the patient, with a verbal understanding of the material. Patient agrees to go over the instructions while at home for a better understanding. Patient also instructed to self quarantine after being tested for COVID-19. The opportunity to ask questions was provided.

## 2023-06-30 NOTE — Patient Instructions (Addendum)
 SURGICAL WAITING ROOM VISITATION  Patients having surgery or a procedure may have no more than 2 support people in the waiting area - these visitors may rotate.    Children under the age of 80 must have an adult with them who is not the patient.  Visitors with respiratory illnesses are discouraged from visiting and should remain at home.  If the patient needs to stay at the hospital during part of their recovery, the visitor guidelines for inpatient rooms apply. Pre-op nurse will coordinate an appropriate time for 1 support person to accompany patient in pre-op.  This support person may not rotate.    Please refer to the [facility] website for the visitor guidelines for Inpatients (after your surgery is over and you are in a regular room).    Your procedure is scheduled on: 07-22-23   Report to [facility] Main Entrance    Report to admitting at 6:45 AM   Call this number if you have problems the morning of surgery [phone]   Do not eat food :After Midnight.   After Midnight you may have the following liquids until 6:00 AM DAY OF SURGERY   then nothing by mouth  Water Non-Citrus Juices (without pulp, NO RED-Apple, White grape, White cranberry) Black Coffee (NO MILK/CREAM OR CREAMERS, sugar ok)  Clear Tea (NO MILK/CREAM OR CREAMERS, sugar ok) regular and decaf                             Plain Jell-O (NO RED)                                           Fruit ices (not with fruit pulp, NO RED)                                     Popsicles (NO RED)                                                               Sports drinks like Gatorade (NO RED)                     The day of surgery:  Drink ONE (1) Pre-Surgery Clear Ensure BY 6:00 AM the morning of surgery. Drink in one sitting. Do not sip.  This drink was given to you during your hospital  pre-op appointment visit. Nothing else to drink after completing the  Pre-Surgery Clear Ensure .          If you have  questions, please contact your surgeon's office.   FOLLOW  ANY ADDITIONAL PRE OP INSTRUCTIONS YOU RECEIVED FROM YOUR SURGEON'S OFFICE!!!     Oral Hygiene is also important to reduce your risk of infection.                                    Remember - BRUSH YOUR TEETH THE MORNING OF SURGERY WITH YOUR REGULAR TOOTHPASTE  DENTURES WILL BE REMOVED PRIOR  TO SURGERY PLEASE DO NOT APPLY Poly grip OR ADHESIVES!!!   Stop all vitamins and herbal supplements 7 days before surgery.   Take these medicines the morning of surgery with A SIP OF WATER: Tylenol  if needed                              You may not have any metal on your body including hair pins, jewelry, and body piercing             Do not wear make-up, lotions, powders, perfumes, or deodorant  Do not wear nail polish including gel and S&S, artificial/acrylic nails, or any other type of covering on natural nails including finger and toenails. If you have artificial nails, gel coating, etc. that needs to be removed by a nail salon please have this removed prior to surgery or surgery may need to be canceled/ delayed if the surgeon/ anesthesia feels like they are unable to be safely monitored.   Do not shave  48 hours prior to surgery.    Do not bring valuables to the hospital. [facility] IS NOT             RESPONSIBLE   FOR VALUABLES.   Contacts, glasses, dentures or bridgework may not be worn into surgery.   Bring small overnight bag day of surgery.   DO NOT BRING YOUR HOME MEDICATIONS TO THE HOSPITAL. PHARMACY WILL DISPENSE MEDICATIONS LISTED ON YOUR MEDICATION LIST TO YOU DURING YOUR ADMISSION IN THE HOSPITAL!    Patients discharged on the day of surgery will not be allowed to drive home.  Someone NEEDS to stay with you for the first 24 hours after anesthesia.              Please read over the following fact sheets you were given: IF YOU HAVE QUESTIONS ABOUT YOUR PRE-OP INSTRUCTIONS PLEASE CALL [phone]   If you  received a COVID test during your pre-op visit  it is requested that you wear a mask when out in public, stay away from anyone that may not be feeling well and notify your surgeon if you develop symptoms. If you test positive for Covid or have been in contact with anyone that has tested positive in the last 10 days please notify you surgeon.    [facility] - Preparing for Surgery Before surgery, you can play an important role.  Because skin is not sterile, your skin needs to be as free of germs as possible.  You can reduce the number of germs on your skin by washing with CHG (chlorahexidine gluconate) soap before surgery.  CHG is an antiseptic cleaner which kills germs and bonds with the skin to continue killing germs even after washing. Please DO NOT use if you have an allergy to CHG or antibacterial soaps.  If your skin becomes reddened/irritated stop using the CHG and inform your nurse when you arrive at Short Stay. Do not shave (including legs and underarms) for at least 48 hours prior to the first CHG shower.  You may shave your face/neck. Please follow these instructions carefully:  1.  Shower with CHG Soap the night before surgery and the  morning of Surgery.  2.  If you choose to wash your hair, wash your hair first as usual with your  normal  shampoo.  3.  After you shampoo, rinse your hair and body thoroughly to remove the  shampoo.  4.  Use CHG as you would any other liquid soap.  You can apply chg directly  to the skin and wash                       Gently with a scrungie or clean washcloth.  5.  Apply the CHG Soap to your body ONLY FROM THE NECK DOWN.   Do not use on face/ open                           Wound or open sores. Avoid contact with eyes, ears mouth and genitals (private parts).                       Wash face,  Genitals (private parts) with your normal soap.             6.  Wash thoroughly, paying special attention to the area where your surgery  will  be performed.  7.  Thoroughly rinse your body with warm water from the neck down.  8.  DO NOT shower/wash with your normal soap after using and rinsing off  the CHG Soap.                9.  Pat yourself dry with a clean towel.            10.  Wear clean pajamas.            11.  Place clean sheets on your bed the night of your first shower and do not  sleep with pets. Day of Surgery : Do not apply any lotions/deodorants the morning of surgery.  Please wear clean clothes to the hospital/surgery center.  FAILURE TO FOLLOW THESE INSTRUCTIONS MAY RESULT IN THE CANCELLATION OF YOUR SURGERY PATIENT SIGNATURE_________________________________  NURSE SIGNATURE__________________________________  ________________________________________________________________________

## 2023-07-01 ENCOUNTER — Ambulatory Visit: Admitting: Psychology

## 2023-07-01 DIAGNOSIS — F4322 Adjustment disorder with anxiety: Secondary | ICD-10-CM | POA: Diagnosis not present

## 2023-07-01 NOTE — Progress Notes (Signed)
 [facility] Counselor/Therapist Progress Note  Patient ID: Brianna Shaw, MRN: [id number],    Date: 07/01/2023  Time Spent: 60 mins    start time: 1500     end: 1600  Treatment Type: Individual Therapy  Reported Symptoms: Pt presented for today's session, via Caregility video.  Pt granted consent for the session, stating she is in her home with no one else present; pt understands the limits of virtual sessions.  I shared with pt that I am in my office with no one else here.  Mental Status Exam: Appearance:  Casual     Behavior: Appropriate  Motor: Normal  Speech/Language:  Clear and Coherent  Affect: Appropriate  Mood: normal  Thought process: normal  Thought content:   WNL  Sensory/Perceptual disturbances:   WNL  Orientation: oriented to person, place, and time/date  Attention: Good  Concentration: Good  Memory: WNL  Fund of knowledge:  Good  Insight:   Good  Judgment:  Good  Impulse Control: Good   Risk Assessment: Danger to Self:  No Self-injurious Behavior: No Danger to Others: No Duty to Warn:no Physical Aggression / Violence:No  Access to Firearms a concern: No  Gang Involvement:No   Subjective: Pt shares that I just left my mom in the hospital; she was admitted this past Tuesday and she has pneumonia again and has infection in her blood.  My knee is still giving me problems and I am scheduled for surgery on 7/11 to have the bone fragment removed; I am not having the ligament repaired yet because I don't have short term disability.  Pt has a friend's wedding in [place], [place] in two weeks.  Pt shares that she has let her supervisor know a month ago that she hurt her knee and she has not heard anything from her at all.  Pt shares that a good friend of her has been diagnosed with stage 4 lung cancer but he is a non-smoker; she is going to be supportive of her friend during this time.  Pt shares that [name] came over to check on their mom while she was gone  to [facility] for a concert two weekends ago.  Pt shares that she has not been as stressed since she has been out of work with her knee injury.  Pt shares that she is still planning to go to [place] in early August, about 3 weeks after her surgery.   Encouraged pt to continue with her self care activities and we will meet in 3 weeks for a follow up session.  Interventions: Cognitive Behavioral Therapy  Diagnosis:Adjustment disorder with anxious mood  Plan: Treatment Plan Strengths/Abilities:  Intelligent, Intuitive, Willing to participate in therapy Treatment Preferences:  Outpatient Individual Therapy Statement of Needs:  Patient is to use CBT, mindfulness and coping skills to help manage and/or decrease symptoms associated with their diagnosis. Symptoms:  Depressed/Irritable mood, worry, social withdrawal Problems Addressed:  Depressive thoughts, Sadness, Sleep issues, etc. Long Term Goals:  Pt to reduce overall level, frequency, and intensity of the feelings of anxiety as evidenced by decreased irritability, negative self talk, and helpless feelings from 6 to 7 days/week to 0 to 1 days/week, per client report, for at least 3 consecutive months.  Progress: 30% Short Term Goals:  Pt to verbally express understanding of the relationship between feelings of anxiety and their impact on thinking patterns and behaviors.  Pt to verbalize an understanding of the role that distorted thinking plays in creating fears, excessive worry, and  ruminations.  Progress: 30% Target Date:  02/26/2024 Frequency:  Bi-weekly Modality:  Cognitive Behavioral Therapy Interventions by Therapist:  Therapist will use CBT, Mindfulness exercises, Coping skills and Referrals, as needed by client. Client has verbally approved this treatment plan.  Jhonny Moss, [facility]

## 2023-07-13 ENCOUNTER — Encounter (HOSPITAL_COMMUNITY): Payer: Self-pay

## 2023-07-13 ENCOUNTER — Encounter (HOSPITAL_COMMUNITY)
Admission: RE | Admit: 2023-07-13 | Discharge: 2023-07-13 | Disposition: A | Discharge status: Home / Self Care | Source: Ambulatory Visit | Attending: Orthopedic Surgery | Admitting: Orthopedic Surgery

## 2023-07-13 ENCOUNTER — Other Ambulatory Visit: Payer: Self-pay

## 2023-07-13 VITALS — BP 107/66 | HR 82 | Temp 98.6°F | Resp 16 | Ht 66.0 in | Wt 130.0 lb

## 2023-07-13 DIAGNOSIS — Z01818 Encounter for other preprocedural examination: Secondary | ICD-10-CM

## 2023-07-13 DIAGNOSIS — Z01812 Encounter for preprocedural laboratory examination: Secondary | ICD-10-CM | POA: Diagnosis not present

## 2023-07-13 LAB — CBC
HCT: 44.1 % (ref 36.0–46.0)
Hemoglobin: 14.4 g/dL (ref 12.0–15.0)
MCH: 32.3 pg (ref 26.0–34.0)
MCHC: 32.7 g/dL (ref 30.0–36.0)
MCV: 98.9 fL (ref 80.0–100.0)
Platelets: 190 10*3/uL (ref 150–400)
RBC: 4.46 MIL/uL (ref 3.87–5.11)
RDW: 13.2 % (ref 11.5–15.5)
WBC: 12.7 10*3/uL — ABNORMAL HIGH (ref 4.0–10.5)
nRBC: 0 % (ref 0.0–0.2)

## 2023-07-13 NOTE — Progress Notes (Signed)
 COVID Vaccine Completed:  Date of COVID positive in last 90 days:  PCP - Joesph Sear, PA- need a new one Cardiologist - n/a  Chest x-ray - n/a EKG - n/a Stress Test - n/a ECHO - n/a Cardiac Cath - n/a Pacemaker/ICD device last checked: n/a Spinal Cord Stimulator: n/a  Bowel Prep - no  Sleep Study - n/a CPAP -   Fasting Blood Sugar - n/a Checks Blood Sugar _____ times a day  Last dose of GLP1 agonist-  N/A GLP1 instructions:  Do not take after     Last dose of SGLT-2 inhibitors-  N/A SGLT-2 instructions:  Do not take after     Blood Thinner Instructions:  Last dose: n/a  Time: Aspirin  Instructions: Last Dose:  Activity level: Can go up a flight of stairs and perform activities of daily living without stopping and without symptoms of chest pain or shortness of breath. Using crutch PRN.  Anesthesia review:   Patient denies shortness of breath, fever, cough and chest pain at PAT appointment  Patient verbalized understanding of instructions that were given to them at the PAT appointment. Patient was also instructed that they will need to review over the PAT instructions again at home before surgery.

## 2023-07-14 ENCOUNTER — Telehealth: Admitting: Physician Assistant

## 2023-07-14 DIAGNOSIS — J069 Acute upper respiratory infection, unspecified: Secondary | ICD-10-CM

## 2023-07-14 MED ORDER — FLUTICASONE PROPIONATE 50 MCG/ACT NA SUSP: 2.0000 | Freq: Every day | NASAL | 0 refills | Status: AC

## 2023-07-14 MED ORDER — NAPROXEN 500 MG PO TABS: 500.0000 mg | ORAL_TABLET | Freq: Two times a day (BID) | ORAL | 0 refills | Status: DC

## 2023-07-14 NOTE — Progress Notes (Signed)
 E-Visit for [employer] Virus / COVID Screening  Your current symptoms could be consistent with COVID.  Please complete a Covid test either at home or check with your local pharmacy to see if they provide testing.    You have tested positive for COVID-19, meaning that you were infected with the novel coronavirus and could give the virus to others.  Most people with COVID-19 have mild illness and can recover at home without medical care. Do not leave your home, except to get medical care. Do not visit public areas and do not go to places where you are unable to wear a mask. It is important that you stay home  to take care for yourself and to help protect other people in your home and community.      Isolation Instructions:   You are to isolate at home until you have been fever free for at least 24 hours without a fever-reducing medication, and symptoms have been steadily improving for 24 hours. At that time,  you can end isolation but need to mask for an additional 5 days.  If you must be around other household members who do not have symptoms, you need to make sure that both you and the family members are masking consistently with a high-quality mask.  If you note any worsening of symptoms despite treatment, please seek an in-person evaluation ASAP. If you note any significant shortness of breath or any chest pain, please seek ER evaluation. Please do not delay care!  Go to the nearest hospital ED for assessment if fever/cough/breathlessness are severe or illness seems like a threat to life.    The following symptoms may appear 2-14 days after exposure: Fever Cough Shortness of breath or difficulty breathing Chills Repeated shaking with chills Muscle pain Headache Sore throat New loss of taste or smell Fatigue Congestion or runny nose Nausea or vomiting Diarrhea  You can use medication such as I have prescribed an anti-inflammatory - Naprosyn 500 mg. Take twice daily as needed for fever or  body aches for 2 weeks and I have prescribed Fluticasone nasal spray 2 sprays in each nostril one time per dayasal spray 2 sprays in each nostril one time per day  You may also take acetaminophen  (Tylenol ) as needed for fever.  HOME CARE Only take medications as instructed by your medical team. Drink plenty of fluids and get plenty of rest. A steam or ultrasonic humidifier can help if you have congestion.  GET HELP RIGHT AWAY IF YOU HAVE EMERGENCY WARNING SIGNS.  Call 911 or proceed to your closest emergency facility if: You develop worsening high fever. Trouble breathing Bluish lips or face Persistent pain or pressure in the chest New confusion Inability to wake or stay awake You cough up blood. Your symptoms become more severe Inability to hold down food or fluids  This list is not all possible symptoms. Contact your medical provider for any symptoms that are severe or concerning to you.   Your e-visit answers were reviewed by a board certified advanced clinical practitioner to complete your personal care plan.  Depending on the condition, your plan could have included both over the counter or prescription medications.  If there is a problem, please reply once you have received a response from your provider.  Your safety is important to us .  If you have drug allergies check your prescription carefully.    You can use MyChart to ask questions about today's visit, request a non-urgent call back, or ask for  a work or school excuse for 24 hours related to this e-Visit. If it has been greater than 24 hours you will need to follow up with your provider or enter a new e-Visit to address those concerns. You will get an e-mail in the next two days asking about your experience.  I hope that your e-visit has been valuable and will speed your recovery. Thank you for using e-visits.    I have spent 5 minutes in review of e-visit questionnaire, review and updating patient chart, medical decision  making and response to patient.   Delon CHRISTELLA Dickinson, PA-C

## 2023-07-21 NOTE — Anesthesia Preprocedure Evaluation (Addendum)
 Anesthesia Evaluation  Patient identified by MRN, date of birth, ID band  Reviewed: Allergy & Precautions, NPO status , Patient's Chart, lab work & pertinent test results  History of Anesthesia Complications Negative for: history of anesthetic complications  Airway Mallampati: I  TM Distance: >3 FB Neck ROM: Full    Dental  (+) Chipped, Dental Advisory Given   Pulmonary neg shortness of breath, neg sleep apnea, neg COPD, Recent URI  (Sinus infection almost 2 weeks ago, home flu and COVID test were negative)   Pulmonary exam normal breath sounds clear to auscultation       Cardiovascular negative cardio ROS  Rhythm:Regular Rate:Normal     Neuro/Psych  PSYCHIATRIC DISORDERS (h/o anorexia) Anxiety Depression    negative neurological ROS     GI/Hepatic Neg liver ROS, PUD,GERD  ,,UC   Endo/Other  negative endocrine ROS    Renal/GU negative Renal ROS     Musculoskeletal   Abdominal   Peds  Hematology negative hematology ROS (+) Lab Results      Component                Value               Date                      WBC                      12.7 (H)            07/13/2023                HGB                      14.4                07/13/2023                HCT                      44.1                07/13/2023                MCV                      98.9                07/13/2023                PLT                      190                 07/13/2023              Anesthesia Other Findings H/o Lyme disease  Reproductive/Obstetrics                              Anesthesia Physical Anesthesia Plan  ASA: 2  Anesthesia Plan: General   Post-op Pain Management: Tylenol  PO (pre-op)*   Induction: Intravenous  PONV Risk Score and Plan: 3 and Ondansetron , Dexamethasone , Midazolam  and Treatment may vary due to age or medical condition  Airway Management Planned: LMA  Additional Equipment:    Intra-op Plan:   Post-operative Plan: Extubation in OR  Informed Consent: I have reviewed the patients History and Physical, chart, labs and discussed the procedure including the risks, benefits and alternatives for the proposed anesthesia with the patient or authorized representative who has indicated his/her understanding and acceptance.     Dental advisory given  Plan Discussed with: CRNA and Anesthesiologist  Anesthesia Plan Comments: (Risks of general anesthesia discussed including, but not limited to, sore throat, hoarse voice, chipped/damaged teeth, injury to vocal cords, nausea and vomiting, allergic reactions, lung infection, heart attack, stroke, and death. All questions answered. )         Anesthesia Quick Evaluation

## 2023-07-22 ENCOUNTER — Other Ambulatory Visit (HOSPITAL_COMMUNITY): Payer: Self-pay

## 2023-07-22 ENCOUNTER — Ambulatory Visit (HOSPITAL_COMMUNITY): Payer: Self-pay | Admitting: Anesthesiology

## 2023-07-22 ENCOUNTER — Encounter (HOSPITAL_COMMUNITY)
Admission: RE | Disposition: A | Discharge status: Home / Self Care | Payer: Self-pay | Source: Ambulatory Visit | Attending: Orthopedic Surgery

## 2023-07-22 ENCOUNTER — Other Ambulatory Visit: Payer: Self-pay

## 2023-07-22 ENCOUNTER — Encounter (HOSPITAL_COMMUNITY): Payer: Self-pay | Admitting: Orthopedic Surgery

## 2023-07-22 ENCOUNTER — Ambulatory Visit (HOSPITAL_COMMUNITY)
Admission: RE | Admit: 2023-07-22 | Discharge: 2023-07-22 | Disposition: A | Discharge status: Home / Self Care | Source: Ambulatory Visit | Attending: Orthopedic Surgery | Admitting: Orthopedic Surgery

## 2023-07-22 ENCOUNTER — Ambulatory Visit (HOSPITAL_BASED_OUTPATIENT_CLINIC_OR_DEPARTMENT_OTHER): Payer: Self-pay | Admitting: Anesthesiology

## 2023-07-22 DIAGNOSIS — F419 Anxiety disorder, unspecified: Secondary | ICD-10-CM | POA: Insufficient documentation

## 2023-07-22 DIAGNOSIS — M6752 Plica syndrome, left knee: Secondary | ICD-10-CM | POA: Diagnosis not present

## 2023-07-22 DIAGNOSIS — M2342 Loose body in knee, left knee: Secondary | ICD-10-CM

## 2023-07-22 DIAGNOSIS — M25362 Other instability, left knee: Secondary | ICD-10-CM | POA: Diagnosis not present

## 2023-07-22 DIAGNOSIS — M6588 Other synovitis and tenosynovitis, other site: Secondary | ICD-10-CM | POA: Diagnosis not present

## 2023-07-22 DIAGNOSIS — M65862 Other synovitis and tenosynovitis, left lower leg: Secondary | ICD-10-CM | POA: Diagnosis not present

## 2023-07-22 DIAGNOSIS — M65962 Unspecified synovitis and tenosynovitis, left lower leg: Secondary | ICD-10-CM | POA: Diagnosis not present

## 2023-07-22 DIAGNOSIS — F32A Depression, unspecified: Secondary | ICD-10-CM | POA: Insufficient documentation

## 2023-07-22 HISTORY — PX: KNEE ARTHROSCOPY W/ DEBRIDEMENT: SHX1867

## 2023-07-22 LAB — POCT PREGNANCY, URINE: Preg Test, Ur: NEGATIVE

## 2023-07-22 SURGERY — ARTHROSCOPY, KNEE WITH DEBRIDEMENT
Anesthesia: General | Site: Knee | Laterality: Left

## 2023-07-22 MED ORDER — ORAL CARE MOUTH RINSE
15.0000 mL | Freq: Once | OROMUCOSAL | Status: AC
Start: 2023-07-22 — End: 2023-07-22

## 2023-07-22 MED ORDER — BUPIVACAINE HCL (PF) 0.25 % IJ SOLN
INTRAMUSCULAR | Status: DC | PRN
  Administered 2023-07-22: 17 mL

## 2023-07-22 MED ORDER — ACETAMINOPHEN 500 MG PO TABS
1000.0000 mg | ORAL_TABLET | Freq: Once | ORAL | Status: AC
  Administered 2023-07-22: 1000 mg via ORAL
  Filled 2023-07-22: qty 2

## 2023-07-22 MED ORDER — HYDROCODONE-IBUPROFEN 5-200 MG PO TABS
1.0000 | ORAL_TABLET | Freq: Four times a day (QID) | ORAL | 0 refills | Status: DC | PRN
  Filled 2023-07-22: qty 22, 6d supply, fill #0

## 2023-07-22 MED ORDER — BUPIVACAINE HCL (PF) 0.25 % IJ SOLN
INTRAMUSCULAR | Status: AC
  Filled 2023-07-22: qty 30

## 2023-07-22 MED ORDER — FENTANYL CITRATE PF 50 MCG/ML IJ SOSY
25.0000 ug | PREFILLED_SYRINGE | INTRAMUSCULAR | Status: DC | PRN
  Administered 2023-07-22: 50 ug via INTRAVENOUS

## 2023-07-22 MED ORDER — FENTANYL CITRATE (PF) 100 MCG/2ML IJ SOLN
INTRAMUSCULAR | Status: DC | PRN
  Administered 2023-07-22 (×2): 25 ug via INTRAVENOUS
  Administered 2023-07-22: 50 ug via INTRAVENOUS

## 2023-07-22 MED ORDER — PHENYLEPHRINE 80 MCG/ML (10ML) SYRINGE FOR IV PUSH (FOR BLOOD PRESSURE SUPPORT)
PREFILLED_SYRINGE | INTRAVENOUS | Status: AC
  Filled 2023-07-22: qty 10

## 2023-07-22 MED ORDER — EPHEDRINE SULFATE-NACL 50-0.9 MG/10ML-% IV SOSY
PREFILLED_SYRINGE | INTRAVENOUS | Status: DC | PRN
  Administered 2023-07-22 (×2): 5 mg via INTRAVENOUS

## 2023-07-22 MED ORDER — OXYCODONE HCL 5 MG/5ML PO SOLN: 5.0000 mg | Freq: Once | ORAL | Status: DC | PRN

## 2023-07-22 MED ORDER — PROPOFOL 10 MG/ML IV BOLUS
INTRAVENOUS | Status: DC | PRN
  Administered 2023-07-22: 150 mg via INTRAVENOUS

## 2023-07-22 MED ORDER — DEXAMETHASONE SODIUM PHOSPHATE 10 MG/ML IJ SOLN
INTRAMUSCULAR | Status: DC | PRN
Start: 2023-07-22 — End: 2023-07-22
  Administered 2023-07-22: 10 mg via INTRAVENOUS

## 2023-07-22 MED ORDER — AMISULPRIDE (ANTIEMETIC) 5 MG/2ML IV SOLN: 10.0000 mg | Freq: Once | INTRAVENOUS | Status: DC | PRN

## 2023-07-22 MED ORDER — DEXAMETHASONE SODIUM PHOSPHATE 10 MG/ML IJ SOLN
INTRAMUSCULAR | Status: AC
  Filled 2023-07-22: qty 1

## 2023-07-22 MED ORDER — LACTATED RINGERS IV SOLN: INTRAVENOUS | Status: DC

## 2023-07-22 MED ORDER — MIDAZOLAM HCL 2 MG/2ML IJ SOLN
1.0000 mg | INTRAMUSCULAR | Status: DC
  Administered 2023-07-22: 2 mg via INTRAVENOUS

## 2023-07-22 MED ORDER — LIDOCAINE HCL (PF) 2 % IJ SOLN
INTRAMUSCULAR | Status: AC
  Filled 2023-07-22: qty 5

## 2023-07-22 MED ORDER — CEFAZOLIN SODIUM-DEXTROSE 2-4 GM/100ML-% IV SOLN
2.0000 g | INTRAVENOUS | Status: AC
  Administered 2023-07-22: 2 g via INTRAVENOUS
  Filled 2023-07-22: qty 100

## 2023-07-22 MED ORDER — ONDANSETRON HCL 4 MG/2ML IJ SOLN
INTRAMUSCULAR | Status: DC | PRN
  Administered 2023-07-22: 4 mg via INTRAVENOUS

## 2023-07-22 MED ORDER — FENTANYL CITRATE (PF) 100 MCG/2ML IJ SOLN
INTRAMUSCULAR | Status: AC
  Filled 2023-07-22: qty 2

## 2023-07-22 MED ORDER — ONDANSETRON 4 MG PO TBDP
4.0000 mg | ORAL_TABLET | Freq: Three times a day (TID) | ORAL | 0 refills | Status: AC | PRN
  Filled 2023-07-22: qty 20, 7d supply, fill #0

## 2023-07-22 MED ORDER — FENTANYL CITRATE PF 50 MCG/ML IJ SOSY
PREFILLED_SYRINGE | INTRAMUSCULAR | Status: AC
  Filled 2023-07-22: qty 1

## 2023-07-22 MED ORDER — EPINEPHRINE PF 1 MG/ML IJ SOLN
INTRAMUSCULAR | Status: DC | PRN
  Administered 2023-07-22: 3000 mL

## 2023-07-22 MED ORDER — PHENYLEPHRINE 80 MCG/ML (10ML) SYRINGE FOR IV PUSH (FOR BLOOD PRESSURE SUPPORT)
PREFILLED_SYRINGE | INTRAVENOUS | Status: DC | PRN
  Administered 2023-07-22: 80 ug via INTRAVENOUS

## 2023-07-22 MED ORDER — CHLORHEXIDINE GLUCONATE 0.12 % MT SOLN
15.0000 mL | Freq: Once | OROMUCOSAL | Status: AC
  Administered 2023-07-22: 15 mL via OROMUCOSAL

## 2023-07-22 MED ORDER — PROPOFOL 10 MG/ML IV BOLUS
INTRAVENOUS | Status: AC
  Filled 2023-07-22: qty 20

## 2023-07-22 MED ORDER — MIDAZOLAM HCL 2 MG/2ML IJ SOLN
INTRAMUSCULAR | Status: AC
  Filled 2023-07-22: qty 2

## 2023-07-22 MED ORDER — LIDOCAINE HCL (PF) 2 % IJ SOLN
INTRAMUSCULAR | Status: DC | PRN
  Administered 2023-07-22: 100 mg via INTRADERMAL

## 2023-07-22 MED ORDER — FENTANYL CITRATE PF 50 MCG/ML IJ SOSY: 50.0000 ug | PREFILLED_SYRINGE | INTRAMUSCULAR | Status: DC

## 2023-07-22 MED ORDER — SODIUM CHLORIDE 0.9 % IR SOLN: Status: DC | PRN

## 2023-07-22 MED ORDER — EPINEPHRINE PF 1 MG/ML IJ SOLN
INTRAMUSCULAR | Status: AC
  Filled 2023-07-22: qty 1

## 2023-07-22 MED ORDER — ONDANSETRON HCL 4 MG/2ML IJ SOLN
INTRAMUSCULAR | Status: AC
Start: 2023-07-22 — End: 2023-07-22
  Filled 2023-07-22: qty 2

## 2023-07-22 MED ORDER — OXYCODONE HCL 5 MG PO TABS: 5.0000 mg | ORAL_TABLET | Freq: Once | ORAL | Status: DC | PRN

## 2023-07-22 SURGICAL SUPPLY — 33 items
BANDAGE ESMARK 6X9 LF (GAUZE/BANDAGES/DRESSINGS) IMPLANT
BLADE SHAVER TORPEDO 4X13 (MISCELLANEOUS) ×1 IMPLANT
BNDG ELASTIC 4INX 5YD STR LF (GAUZE/BANDAGES/DRESSINGS) IMPLANT
BNDG ELASTIC 6INX 5YD STR LF (GAUZE/BANDAGES/DRESSINGS) ×1 IMPLANT
CUFF TRNQT CYL 34X4.125X (TOURNIQUET CUFF) ×1 IMPLANT
DRAPE ARTHROSCOPY W/POUCH 114 (DRAPES) ×1 IMPLANT
DRAPE U-SHAPE 47X51 STRL (DRAPES) ×1 IMPLANT
DURAPREP 26ML APPLICATOR (WOUND CARE) ×1 IMPLANT
ELECT PENCIL ROCKER SW 15FT (MISCELLANEOUS) ×1 IMPLANT
GAUZE 4X4 16PLY ~~LOC~~+RFID DBL (SPONGE) ×1 IMPLANT
GAUZE PAD ABD 8X10 STRL (GAUZE/BANDAGES/DRESSINGS) IMPLANT
GAUZE SPONGE 4X4 12PLY STRL (GAUZE/BANDAGES/DRESSINGS) ×1 IMPLANT
GAUZE XEROFORM 1X8 LF (GAUZE/BANDAGES/DRESSINGS) ×1 IMPLANT
GLOVE BIO SURGEON STRL SZ7.5 (GLOVE) ×2 IMPLANT
GLOVE BIOGEL PI IND STRL 8 (GLOVE) ×2 IMPLANT
GOWN STRL REUS W/ TWL XL LVL3 (GOWN DISPOSABLE) ×2 IMPLANT
IV NS IRRIG 3000ML ARTHROMATIC (IV SOLUTION) ×2 IMPLANT
KIT BASIN OR (CUSTOM PROCEDURE TRAY) ×1 IMPLANT
MANIFOLD NEPTUNE II (INSTRUMENTS) ×1 IMPLANT
PACK ARTHROSCOPY WL (CUSTOM PROCEDURE TRAY) ×1 IMPLANT
PAD ARMBOARD POSITIONER FOAM (MISCELLANEOUS) IMPLANT
PADDING CAST COTTON 6X4 STRL (CAST SUPPLIES) ×1 IMPLANT
STRIP CLOSURE SKIN 1/2X4 (GAUZE/BANDAGES/DRESSINGS) IMPLANT
SUT ETHILON 4 0 PS 2 18 (SUTURE) IMPLANT
SUT MNCRL AB 3-0 PS2 18 (SUTURE) ×1 IMPLANT
SYR CONTROL 10ML LL (SYRINGE) IMPLANT
TOWEL GREEN STERILE FF (TOWEL DISPOSABLE) IMPLANT
TOWEL OR 17X26 10 PK STRL BLUE (TOWEL DISPOSABLE) ×1 IMPLANT
TUBING ARTHROSCOPY IRRIG 16FT (MISCELLANEOUS) ×1 IMPLANT
TUBING CONNECTING 10 (TUBING) ×1 IMPLANT
WAND ABLATOR APOLLO I90 (BUR) IMPLANT
WAND APOLLORF SJ50 AR-9845 (SURGICAL WAND) IMPLANT
WATER STERILE IRR 500ML POUR (IV SOLUTION) ×1 IMPLANT

## 2023-07-22 NOTE — Anesthesia Procedure Notes (Signed)
 Procedure Name: LMA Insertion Date/Time: 07/22/2023 10:26 AM  Performed by: Zulema Leita PARAS, CRNAPre-anesthesia Checklist: Patient identified, Emergency Drugs available, Suction available and Patient being monitored Patient Re-evaluated:Patient Re-evaluated prior to induction Oxygen Delivery Method: Circle system utilized Preoxygenation: Pre-oxygenation with 100% oxygen Induction Type: IV induction LMA: LMA flexible inserted LMA Size: 4.0 Number of attempts: 1 Placement Confirmation: positive ETCO2 and breath sounds checked- equal and bilateral Tube secured with: Tape Dental Injury: Teeth and Oropharynx as per pre-operative assessment

## 2023-07-22 NOTE — Transfer of Care (Signed)
 Immediate Anesthesia Transfer of Care Note  Patient: Brianna Shaw  Procedure(s) Performed: ARTHROSCOPY, KNEE WITH DEBRIDEMENT (Left: Knee)  Patient Location: PACU  Anesthesia Type:General  Level of Consciousness: awake, alert , and oriented  Airway & Oxygen Therapy: Patient Spontanous Breathing and Patient connected to face mask oxygen  Post-op Assessment: Report given to RN and Post -op Vital signs reviewed and stable  Post vital signs: Reviewed and stable  Last Vitals:  Vitals Value Taken Time  BP 102/72 07/22/23 11:10  Temp 36.6 C 07/22/23 11:10  Pulse 85 07/22/23 11:11  Resp 22 07/22/23 11:11  SpO2 100 % 07/22/23 11:11  Vitals shown include unfiled device data.  Last Pain:  Vitals:   07/22/23 0758  TempSrc: Oral  PainSc:          Complications: No notable events documented.

## 2023-07-22 NOTE — Discharge Instructions (Signed)
Post-operative patient instructions  Knee Arthroscopy   Ice:  Place intermittent ice or cooler pack over your knee, 30 minutes on and 30 minutes off.  Continue this for the first 72 hours after surgery, then save ice for use after therapy sessions or on more active days.   Weight:  You may bear weight on your leg as your symptoms allow. DVT prevention: Perform ankle pumps as able throughout the day on the operative extremity.  Be mobile as possible with ambulation as able.  You should also take an 81 mg aspirin once per day x6 weeks. Crutches:  Use crutches (or walker) to assist in walking until told to discontinue by your physical therapist or physician. This will help to reduce pain. Strengthening:  Perform simple thigh squeezes (isometric quad contractions) and straight leg lifts as you are able (3 sets of 5 to 10 repetitions, 3 times a day).  For the leg lifts, have someone support under your ankle in the beginning until you have increased strength enough to do this on your own.  To help get started on thigh squeezes, place a pillow under your knee and push down on the pillow with back of knee (sometimes easier to do than with your leg fully straight). Motion:  Perform gentle knee motion as tolerated - this is gentle bending and straightening of the knee. Seated heel slides: you can start by sitting in a chair, remove your brace, and gently slide your heel back on the floor - allowing your knee to bend. Have someone help you straighten your knee (or use your other leg/foot hooked under your ankle.  Dressing:  Perform 1st dressing change at 3 days postoperative. A moderate amount of blood tinged drainage is to be expected.  So if you bleed through the dressing on the first or second day or if you have fevers, it is fine to change the dressing/check the wounds early and redress wound. Elevate your leg.  If it bleeds through again, or if the incisions are leaking frank blood, please call the office. May  change dressing every 1-2 days thereafter to help watch wounds. Can purchase Tegderm (or 14M Nexcare) water resistant dressings at local pharmacy / [facility]. Shower:  shower is ok after 3 days.  Please take shower, NO bath. Recover with gauze and ace wrap to help keep wounds protected.   Pain medication:  A narcotic pain medication has been prescribed.  Take as directed.  Typically you need narcotic pain medication more regularly during the first 3 to 5 days after surgery.  Decrease your use of the medication as the pain improves.  Narcotics can sometimes cause constipation, even after a few doses.  If you have problems with constipation, you can take an over the counter stool softener or light laxative.  If you have persistent problems, please notify your physician's office. Physical therapy: Additional activity guidelines to be provided by your physician or physical therapist at follow-up visits.  Driving: Do not recommend driving x 1-2 weeks post surgical, especially if surgery performed on right side. Should not drive while taking narcotic pain medications. It typically takes at least 2 weeks to restore sufficient neuromuscular function for normal reaction times for driving safety.  Call [phone] for questions or problems. Evenings you will be forwarded to the hospital operator.  Ask for the orthopaedic physician on call. Please call if you experience:    Redness, foul smelling, or persistent drainage from the surgical site  worsening knee pain and swelling  not responsive to medication  any calf pain and or swelling of the lower leg  temperatures greater than 101.5 F other questions or concerns   Thank you for allowing [place] to be a part of your care

## 2023-07-22 NOTE — Anesthesia Postprocedure Evaluation (Signed)
 Anesthesia Post Note  Patient: Brianna Shaw  Procedure(s) Performed: ARTHROSCOPY, KNEE WITH DEBRIDEMENT (Left: Knee)     Patient location during evaluation: PACU Anesthesia Type: General Level of consciousness: awake Pain management: pain level controlled Vital Signs Assessment: post-procedure vital signs reviewed and stable Respiratory status: spontaneous breathing, nonlabored ventilation and respiratory function stable Cardiovascular status: blood pressure returned to baseline and stable Postop Assessment: no apparent nausea or vomiting Anesthetic complications: no   No notable events documented.  Last Vitals:  Vitals:   07/22/23 1145 07/22/23 1200  BP: 103/68 91/60  Pulse: 73 70  Resp: 11 14  Temp:  36.5 C  SpO2: 98% 96%    Last Pain:  Vitals:   07/22/23 1200  TempSrc:   PainSc: 3                  Delon Aisha Arch

## 2023-07-22 NOTE — Brief Op Note (Signed)
 07/22/2023  11:06 AM  PATIENT:  Brianna Shaw  43 y.o. female  PRE-OPERATIVE DIAGNOSIS:  Left knee loose body  POST-OPERATIVE DIAGNOSIS:  Left knee loose body  PROCEDURE:  Procedure(s) with comments: ARTHROSCOPY, KNEE WITH DEBRIDEMENT (Left) - Left knee arthroscopy with loose body removal  SURGEON:  Surgeons and Role:    * Sharl Selinda Dover, MD - Primary  PHYSICIAN ASSISTANT: Dayle Moores, PA-C  ANESTHESIA:   local and general  EBL:  5 mL   BLOOD ADMINISTERED:none  DRAINS: none   LOCAL MEDICATIONS USED:  MARCAINE      SPECIMEN:  No Specimen  DISPOSITION OF SPECIMEN:  N/A  COUNTS:  YES  TOURNIQUET:  * No tourniquets in log *  DICTATION: .Note written in EPIC  PLAN OF CARE: Discharge to home after PACU  PATIENT DISPOSITION:  PACU - hemodynamically stable.   Delay start of Pharmacological VTE agent (>24hrs) due to surgical blood loss or risk of bleeding: not applicable

## 2023-07-22 NOTE — Op Note (Signed)
 Surgery Date: 07/22/2023  Surgeon(s): Sharl Selinda Dover, MD  Assistant:  Dayle Moores, PA-C  Assistant attestation:  PA Mcclung scrubbed and present for the entire procedure.  ANESTHESIA:  general, 17 cc of quarter percent plain Marcaine  infiltrated into portal sites.  FLUIDS: Per anesthesia record.   ESTIMATED BLOOD LOSS: minimal  PREOPERATIVE DIAGNOSES:  1.  Left knee patellofemoral instability 2.  Left knee chondral loose body 3.  Left knee synovitis  POSTOPERATIVE DIAGNOSES:  1.  Left knee patellofemoral instability 2.  Left knee chondral loose body 3.  Left knee synovitis 4.  Left knee medial shelf plica  PROCEDURES PERFORMED:  1.  Left knee arthroscopy with limited synovectomy with plica resection 2.  Left knee arthroscopic loose body removal measuring 10 mm x 15 mm   DESCRIPTION OF PROCEDURE: Ms. Brianna Shaw is a 43 y.o.-year-old female with left knee patellofemoral instability with loose body off of the lateral femoral condyle.  Here today for arthroscopic intervention with treatment of loose body removal and debridements as necessary. Full discussion held regarding risks benefits alternatives and complications related surgical intervention. Conservative care options reviewed. All questions answered.  The patient was identified in the preoperative holding area and the operative extremity was marked. The patient was brought to the operating room and transferred to operating table in a supine position. Satisfactory general anesthesia was induced by anesthesiology.    Standard anterolateral, anteromedial arthroscopy portals were obtained. The anteromedial portal was obtained with a spinal needle for localization under direct visualization with subsequent diagnostic findings.   Anteromedial and anterolateral chambers: moderate synovitis. The synovitis was debrided with a 4.5 mm full radius shaver through both the anteromedial and lateral portals.   Suprapatellar pouch and  gutters: mild synovitis or debris. Patella chondral surface: Grade 0 Trochlear chondral surface: Grade 4 lesion on the lateral femoral condyle proximal to the sulcus terminalis measuring 18 mm x 10 mm from medial to lateral. Patellofemoral tracking: Lateral tilt and lateral translation consistent with lateral instability Medial meniscus: Intact.  Medial femoral condyle flexion bearing surface: Grade 0 Medial femoral condyle extension bearing surface: Grade 0 Medial tibial plateau: Grade 0 Anterior cruciate ligament:stable Posterior cruciate ligament:stable Lateral meniscus: Intact without tear.   Lateral femoral condyle flexion bearing surface: Grade 0 Lateral femoral condyle extension bearing surface: Grade 0 Lateral tibial plateau: Grade 0  Loose body encountered in the posterior aspect of the medial compartment just posterior to the medial femoral condyle.  We did establish a posterior medial portal to retrieve this with grasping retriever.  This was passed off the back table and measured to be 18 mm x 10 mm.  After completion of synovectomy, diagnostic exam, and debridements as described, all compartments were checked and no residual debris remained. Hemostasis was achieved with the cautery wand. The portals were approximated with buried monocryl. All excess fluid was expressed from the joint.  Xeroform sterile gauze dressings were applied followed by Ace bandage and ice pack.   DISPOSITION: The patient was awakened from general anesthetic, extubated, taken to the recovery room in medically stable condition, no apparent complications. The patient may be weightbearing as tolerated to the operative lower extremity.  Range of motion of right knee as tolerated.

## 2023-07-22 NOTE — H&P (Signed)
 ORTHOPAEDIC H&P  REQUESTING PHYSICIAN: Sharl Selinda Dover, MD  PCP:  Chandra Toribio POUR, MD  Chief Complaint: Left knee pain  HPI: Brianna Shaw is a 43 y.o. female who complains of left knee pain and swelling following a lateral patellar dislocation episode a few weeks back.  She has a tear of the medial patellofemoral ligament as well as a large chondral loose body in the joint.  Here today for selective loose body removal and debridements.  We discussed this as an option to keep her ambulatory with the least amount of downtime given her upcoming travel and work schedule.  Otherwise, no patella dislocation episodes since we last saw her in the office.  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    Past Surgical History:  Procedure Laterality Date   COLONOSCOPY  multiple   eustachian tube     placement in childhood then removed / bilat    iron infusion     LASIK Bilateral    TOTAL HIP ARTHROPLASTY Left 01/01/2014   Procedure: LEFT TOTAL HIP ARTHROPLASTY ANTERIOR APPROACH;  Surgeon: Donnice JONETTA Car, MD;  Location: [facility];  Service: Orthopedics;  Laterality: Left;   TOTAL HIP ARTHROPLASTY Right 12/10/2014   Procedure: TOTAL RIGHT HIP ARTHROPLASTY ANTERIOR APPROACH;  Surgeon: Donnice Car, MD;  Location: [facility];  Service: Orthopedics;  Laterality: Right;   wisdom teeth extrcaction     Social History   Socioeconomic History   Marital status: Single    Spouse name: Not on file   Number of children: 0   Years of education: Not on file   Highest education level: Bachelor's degree (e.g., BA, AB, BS)  Occupational History   Occupation: Teacher, adult education: [employer]  Tobacco Use   Smoking status: Never    Passive exposure: Never   Smokeless tobacco: Never  Vaping Use   Vaping status: Never Used  Substance and Sexual Activity   Alcohol  use: Yes    Alcohol /week: 8.0 standard drinks of alcohol     Types: 8 Cans of beer per week    Comment: socially    Drug use: No   Sexual activity: Not on file  Other Topics Concern   Not on file  Social History Narrative   Single, children, she is a Engineer, civil (consulting) at  [employer]         Social Drivers of Health   Financial Resource Strain: Low Risk  (01/27/2023)   Overall Financial Resource Strain (CARDIA)    Difficulty of Paying Living Expenses: Not very hard  Food Insecurity: No Food Insecurity (01/27/2023)   Hunger Vital Sign    Worried About Running Out of Food in the Last Year: Never true    Ran Out of Food in the Last Year: Never true  Transportation Needs: No Transportation Needs (01/27/2023)   PRAPARE - Administrator, Civil Service (Medical): No    Lack of Transportation (Non-Medical): No  Physical Activity: Unknown (01/27/2023)   Exercise Vital Sign    Days of Exercise per Week: 0 days  Minutes of Exercise per Session: Not on file  Stress: Stress Concern Present (01/27/2023)   [employer] of Occupational Health - Occupational Stress Questionnaire    Feeling of Stress : Very much  Social Connections: Socially Isolated (01/27/2023)   Social Connection and Isolation Panel    Frequency of Communication with Friends and Family: More than three times a week    Frequency of Social Gatherings with Friends and Family: Twice a week    Attends Religious Services: Never    Database administrator or Organizations: No    Attends Engineer, structural: Not on file    Marital Status: Never married   Family History  Problem Relation Age of Onset   Breast cancer Mother    Diabetes Maternal Grandmother    Heart disease Maternal Grandfather    Diabetes Paternal Grandfather     Colon cancer Paternal Grandmother    Esophageal cancer Neg Hx    Rectal cancer Neg Hx    Stomach cancer Neg Hx    Allergies  Allergen Reactions   Augmentin  [Amoxicillin -Pot Clavulanate] Diarrhea   Flagyl  [Metronidazole ] Nausea And Vomiting    Po medication only, tolerates IV   Other Nausea And Vomiting    Seaweed    Prior to Admission medications   Medication Sig Start Date End Date Taking? Authorizing Provider  acetaminophen  (TYLENOL ) 500 MG tablet Take 1,000 mg by mouth every 6 (six) hours as needed for moderate pain (pain score 4-6).   Yes [provider]  ascorbic acid (VITAMIN C) 500 MG tablet Take 500 mg by mouth daily.   Yes [provider]  cetirizine (ZYRTEC) 10 MG tablet Take 10 mg by mouth at bedtime.   Yes [provider]  Cholecalciferol (VITAMIN D -3) 125 MCG (5000 UT) TABS Take 10,000 Units by mouth daily.   Yes [provider]  clonazePAM  (KLONOPIN ) 0.5 MG tablet Take 1 tablet (0.5 mg total) by mouth daily as needed for sleep/insomnia. 03/17/23  Yes Wallace Search A, PA  estradiol  (VIVELLE -DOT) 0.05 MG/24HR patch Place 1 patch (0.05 mg total) onto the skin twice weekly 04/14/23  Yes   fluticasone  (FLONASE ) 50 MCG/ACT nasal spray Place 2 sprays into both nostrils daily. 07/14/23  Yes Vivienne Delon HERO, PA-C  L-Lysine 1000 MG TABS Take 1,000-2,000 mg by mouth daily.   Yes [provider]  Magnesium  Bisglycinate (MAG GLYCINATE PO) Take 4 tablets by mouth at bedtime.   Yes [provider]  melatonin 5 MG TABS Take 5-10 mg by mouth at bedtime as needed (sleep).   Yes [provider]  mesalamine  (LIALDA ) 1.2 g EC tablet Take 4 tablets (4.8 g total) by mouth daily with breakfast. Patient taking differently: Take 2.4 g by mouth daily with breakfast. 08/17/22 08/17/23 Yes Avram Lupita BRAVO, MD  naproxen  (NAPROSYN ) 500 MG tablet Take 1 tablet (500 mg total) by mouth 2 (two) times daily with a meal. 07/14/23  Yes Burnette, Delon HERO, PA-C  OVER THE COUNTER MEDICATION Take 1 tablet by mouth 2 (two) times daily. Primal queen supplement   Yes [provider]  Prenatal Vit-Fe Fumarate-FA (PRENATAL/FOLIC ACID  PO) Take 1 tablet by mouth daily.   Yes [provider]  medroxyPROGESTERone  Acetate 150 MG/ML SUSY Inject 1 mL (150 mg total) into the muscle every 11-12 weeks as directed 09/10/22      No results found.  Positive ROS: All other systems have been reviewed and were otherwise negative with the exception of those mentioned  in the HPI and as above.  Physical Exam: General: Alert, no acute distress Cardiovascular: No pedal edema Respiratory: No cyanosis, no use of accessory musculature GI: No organomegaly, abdomen is soft and non-tender Skin: No lesions in the area of chief complaint Neurologic: Sensation intact distally Psychiatric: Patient is competent for consent with normal mood and affect Lymphatic: No axillary or cervical lymphadenopathy  MUSCULOSKELETAL: Left lower extremity is warm and well-perfused.  No open wounds or lesions.  Neurovascular intact throughout.  Assessment: 1.  Left knee loose body 2.  Left knee patellofemoral instability  Plan: Plan to proceed today with arthroscopy of the left knee with loose body removal and debridements as indicated.  We again discussed the risk and benefits of the procedure in detail including but not limited to bleeding, infection, damage to surrounding nerves and vessels, stiffness, fracture, persistent pain and instability of the patellofemoral joint as well as development of DVT and the risk of anesthesia.  She has provided informed consent.  Plan for discharge home postop from PACU.    Selinda Belvie Gosling, MD Cell [phone]    07/22/2023 8:23 AM

## 2023-07-23 ENCOUNTER — Encounter (HOSPITAL_COMMUNITY): Payer: Self-pay | Admitting: Orthopedic Surgery

## 2023-07-23 ENCOUNTER — Other Ambulatory Visit (HOSPITAL_COMMUNITY): Payer: Self-pay

## 2023-07-23 MED ORDER — HYDROCODONE-ACETAMINOPHEN 5-325 MG PO TABS
1.0000 | ORAL_TABLET | Freq: Four times a day (QID) | ORAL | 0 refills | Status: DC | PRN
  Filled 2023-07-23: qty 25, 7d supply, fill #0

## 2023-07-25 ENCOUNTER — Encounter (HOSPITAL_COMMUNITY): Payer: Self-pay | Admitting: Orthopedic Surgery

## 2023-07-27 ENCOUNTER — Ambulatory Visit (INDEPENDENT_AMBULATORY_CARE_PROVIDER_SITE_OTHER): Admitting: Psychology

## 2023-07-27 DIAGNOSIS — F4322 Adjustment disorder with anxiety: Secondary | ICD-10-CM | POA: Diagnosis not present

## 2023-07-27 NOTE — Progress Notes (Signed)
  [facility] Counselor/Therapist Progress Note  Patient ID: Brianna Shaw, MRN: [id number],    Date: 07/27/2023  Time Spent: 60 mins    start time: 1300     end: 1400  Treatment Type: Individual Therapy  Reported Symptoms: Pt presented for today's session, via Caregility video.  Pt granted consent for the session, stating she is in her home with no one else present; pt understands the limits of virtual sessions.  I shared with pt that I am in my office with no one else here.  Mental Status Exam: Appearance:  Casual     Behavior: Appropriate  Motor: Normal  Speech/Language:  Clear and Coherent  Affect: Appropriate  Mood: normal  Thought process: normal  Thought content:   WNL  Sensory/Perceptual disturbances:   WNL  Orientation: oriented to person, place, and time/date  Attention: Good  Concentration: Good  Memory: WNL  Fund of knowledge:  Good  Insight:   Good  Judgment:  Good  Impulse Control: Good   Risk Assessment: Danger to Self:  No Self-injurious Behavior: No Danger to Others: No Duty to Warn:no Physical Aggression / Violence:No  Access to Firearms a concern: No  Gang Involvement:No   Subjective: Pt shares that My mom (78 yo) has been in rehab since 7/3, after having been discharged from the hospital.  She has been getting IV antibiotics but she has been refusing PT a lot.  Pt shares that she is not crazy about the care her mom is getting.  Pt had surgery last Friday on her knee and is still planning to go to [place], and [place] in 2 weeks (8/3-8/12) and she will be gone for a week.  Pt has not worked in the past 2 months due to her knee injury.  Pt is going to a concert tonight with friends and they are taking responsibility for getting her there and back.  She is planning to start PT on 7/25.  She is now scheduled to go back to work on 8/22.  Pt did go to her friend's wedding in [place] and had a fun time.  Encouraged pt to continue with  her self care activities and we will meet in 3 weeks for a follow up session.  Interventions: Cognitive Behavioral Therapy  Diagnosis:Adjustment disorder with anxious mood  Plan: Treatment Plan Strengths/Abilities:  Intelligent, Intuitive, Willing to participate in therapy Treatment Preferences:  Outpatient Individual Therapy Statement of Needs:  Patient is to use CBT, mindfulness and coping skills to help manage and/or decrease symptoms associated with their diagnosis. Symptoms:  Depressed/Irritable mood, worry, social withdrawal Problems Addressed:  Depressive thoughts, Sadness, Sleep issues, etc. Long Term Goals:  Pt to reduce overall level, frequency, and intensity of the feelings of anxiety as evidenced by decreased irritability, negative self talk, and helpless feelings from 6 to 7 days/week to 0 to 1 days/week, per client report, for at least 3 consecutive months.  Progress: 30% Short Term Goals:  Pt to verbally express understanding of the relationship between feelings of anxiety and their impact on thinking patterns and behaviors.  Pt to verbalize an understanding of the role that distorted thinking plays in creating fears, excessive worry, and ruminations.  Progress: 30% Target Date:  02/26/2024 Frequency:  Bi-weekly Modality:  Cognitive Behavioral Therapy Interventions by Therapist:  Therapist will use CBT, Mindfulness exercises, Coping skills and Referrals, as needed by client. Client has verbally approved this treatment plan.  Francis KATHEE Macintosh, [facility]

## 2023-08-09 ENCOUNTER — Other Ambulatory Visit (HOSPITAL_COMMUNITY): Payer: Self-pay

## 2023-08-26 ENCOUNTER — Ambulatory Visit: Admitting: Psychology

## 2023-08-26 ENCOUNTER — Other Ambulatory Visit (HOSPITAL_COMMUNITY): Payer: Self-pay

## 2023-08-26 ENCOUNTER — Other Ambulatory Visit: Payer: Self-pay | Admitting: Internal Medicine

## 2023-08-26 DIAGNOSIS — K5100 Ulcerative (chronic) pancolitis without complications: Secondary | ICD-10-CM

## 2023-08-26 DIAGNOSIS — F4322 Adjustment disorder with anxiety: Secondary | ICD-10-CM

## 2023-08-26 DIAGNOSIS — M25562 Pain in left knee: Secondary | ICD-10-CM | POA: Diagnosis not present

## 2023-08-26 MED ORDER — MEDROXYPROGESTERONE ACETATE 150 MG/ML IM SUSP
150.0000 mg | INTRAMUSCULAR | 0 refills | Status: DC
  Filled 2023-08-26: qty 1, 90d supply, fill #0

## 2023-08-26 NOTE — Progress Notes (Signed)
 [facility] Counselor/Therapist Progress Note  Patient ID: Brianna Shaw, MRN: [id number],    Date: 08/26/2023  Time Spent: 60 mins    start time: 1500     end: 1600  Treatment Type: Individual Therapy  Reported Symptoms: Pt presented for today's session, via Caregility video.  Pt granted consent for the session, stating she is in her home with no one else present; pt understands the limits of virtual sessions.  I shared with pt that I am in my office with no one else here.  Mental Status Exam: Appearance:  Casual     Behavior: Appropriate  Motor: Normal  Speech/Language:  Clear and Coherent  Affect: Appropriate  Mood: normal  Thought process: normal  Thought content:   WNL  Sensory/Perceptual disturbances:   WNL  Orientation: oriented to person, place, and time/date  Attention: Good  Concentration: Good  Memory: WNL  Fund of knowledge:  Good  Insight:   Good  Judgment:  Good  Impulse Control: Good   Risk Assessment: Danger to Self:  No Self-injurious Behavior: No Danger to Others: No Duty to Warn:no Physical Aggression / Violence:No  Access to Firearms a concern: No  Gang Involvement:No   Subjective: Pt shares that My mom (53 yo) has finally gotten home yesterday from rehab, after having been discharged from the hospital.  She had a big list of medications that she was discharged with and I am trying to figure out what she really needs to be taking.  I have been paying all of her bills and I am finding all kinds recurring charges on her credit cards.  Pt is trying to work on what meds her mom needs to be taking daily.  Robby has made arrangements for someone to come stay with her mom during the day and they are having to pay $30.00 per hour.  Pt shares she had a great time in [place] and everyone was very nice to them.  They had a great time at the [facility] concert.  They went to 2 other concerts while they were there.  Pt went to a petting zoo that had  [place] cows, goats, sheep, etc and that was great for her.  Pt shares that she had PT today for the first time since her surgery and it went fine; the PT is confident that pt will be fine.  Pt should be going back to work on 8/25; she is scheduled to see the surgeon again next week to see if pt has any work restrictions when she returns.  Pt shares that she considered her trip to [place] to be her trip of a lifetime.  Pt has a couple of trips left this year and she is looking forward those trips.  Encouraged pt to continue with her self care activities and we will meet in 4 weeks for a follow up session.  Interventions: Cognitive Behavioral Therapy  Diagnosis:Adjustment disorder with anxious mood  Plan: Treatment Plan Strengths/Abilities:  Intelligent, Intuitive, Willing to participate in therapy Treatment Preferences:  Outpatient Individual Therapy Statement of Needs:  Patient is to use CBT, mindfulness and coping skills to help manage and/or decrease symptoms associated with their diagnosis. Symptoms:  Depressed/Irritable mood, worry, social withdrawal Problems Addressed:  Depressive thoughts, Sadness, Sleep issues, etc. Long Term Goals:  Pt to reduce overall level, frequency, and intensity of the feelings of anxiety as evidenced by decreased irritability, negative self talk, and helpless feelings from 6 to 7 days/week to 0 to 1 days/week,  per client report, for at least 3 consecutive months.  Progress: 30% Short Term Goals:  Pt to verbally express understanding of the relationship between feelings of anxiety and their impact on thinking patterns and behaviors.  Pt to verbalize an understanding of the role that distorted thinking plays in creating fears, excessive worry, and ruminations.  Progress: 30% Target Date:  02/26/2024 Frequency:  Bi-weekly Modality:  Cognitive Behavioral Therapy Interventions by Therapist:  Therapist will use CBT, Mindfulness exercises, Coping skills and Referrals, as  needed by client. Client has verbally approved this treatment plan.  Francis KATHEE Macintosh, [facility]

## 2023-08-29 ENCOUNTER — Other Ambulatory Visit (HOSPITAL_COMMUNITY): Payer: Self-pay

## 2023-08-29 ENCOUNTER — Other Ambulatory Visit: Payer: Self-pay

## 2023-08-29 MED ORDER — MESALAMINE 1.2 G PO TBEC
4.8000 g | DELAYED_RELEASE_TABLET | Freq: Every day | ORAL | 0 refills | Status: AC
  Filled 2023-08-29: qty 360, 90d supply, fill #0

## 2023-08-31 ENCOUNTER — Other Ambulatory Visit: Payer: Self-pay | Admitting: Medical Genetics

## 2023-09-02 DIAGNOSIS — M25562 Pain in left knee: Secondary | ICD-10-CM | POA: Diagnosis not present

## 2023-09-07 ENCOUNTER — Other Ambulatory Visit (HOSPITAL_COMMUNITY): Payer: Self-pay

## 2023-09-07 ENCOUNTER — Encounter: Payer: Self-pay | Admitting: Family Medicine

## 2023-09-07 ENCOUNTER — Ambulatory Visit: Admitting: Family Medicine

## 2023-09-07 VITALS — BP 121/87 | HR 93 | Ht 66.0 in | Wt 141.8 lb

## 2023-09-07 DIAGNOSIS — G4726 Circadian rhythm sleep disorder, shift work type: Secondary | ICD-10-CM

## 2023-09-07 DIAGNOSIS — N951 Menopausal and female climacteric states: Secondary | ICD-10-CM | POA: Insufficient documentation

## 2023-09-07 DIAGNOSIS — S8992XD Unspecified injury of left lower leg, subsequent encounter: Secondary | ICD-10-CM

## 2023-09-07 DIAGNOSIS — Z634 Disappearance and death of family member: Secondary | ICD-10-CM | POA: Diagnosis not present

## 2023-09-07 DIAGNOSIS — S8992XA Unspecified injury of left lower leg, initial encounter: Secondary | ICD-10-CM | POA: Insufficient documentation

## 2023-09-07 MED ORDER — CLONAZEPAM 0.5 MG PO TABS
0.5000 mg | ORAL_TABLET | Freq: Every day | ORAL | 1 refills | Status: DC | PRN
  Filled 2023-09-07: qty 30, 30d supply, fill #0
  Filled 2023-11-08: qty 30, 30d supply, fill #1

## 2023-09-07 NOTE — Assessment & Plan Note (Signed)
-   Status post left knee surgery in 06/2023 for medial ligament tear and patellar fracture. attending physical therapy. Returned to work this week. - Continue current management with orthopedics.

## 2023-09-07 NOTE — Assessment & Plan Note (Signed)
 Pt found out her brother committed suicide a few days ago. Appropriately tearful during exam.  Has a therapist whom she already sees but cannot see them again for a few more weeks.

## 2023-09-07 NOTE — Assessment & Plan Note (Signed)
-   Reports symptoms are improving with estrogen patch and depo-provera  - Continue current treatment.

## 2023-09-07 NOTE — Progress Notes (Signed)
 Established Patient Office Visit  Subjective   Patient ID: Brianna Shaw, female    DOB: [birth year]  Age: 43 y.o. MRN: [id number]  Chief Complaint  Patient presents with   Medication Refill    HPI  Subjective - Follow-up for medication management. 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 Takes it in the morning around 10:00-10:30 AM before a night shift. - Reports recent significant life stressor. Brother died by suicide on 09-15-2023 night. Taking medication daily since the event. Has an appointment with her therapist, Francis, on 09/23/2023. - Reports menopausal symptoms, including hot flashes, which have improved with an estrogen patch. Denied filling a prescription for bupropion , as she felt her symptoms were menopausal, not emotional. - Reports recent knee surgery in June for a medial ligament tear and patellar fracture sustained in October. Now in a brace. Returned to work on 15-Sep-2023. Denies short-term disability.  Medications Estrogen patch for menopausal symptoms. Depo shot. Takes 0.25 mg of a 0.5 mg tablet for sleep, prescribed as 30 tablets for a 40-month supply, though notes it may not last as long this month due to recent stress.  PMH, PSH, FH, Social Hx PMHx: Avascular necrosis from prednisone  use for an ulcer. Menopause. Sees a gastroenterologist (Dr. Maryfrances) for GI issues, last colonoscopy was Christmas before last. PSHx: Left knee surgery (06/2023) for medial ligament tear and patellar fracture. Hip replacement approximately 10 years ago. FHx: Brother died by suicide this week. 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 Currently dealing with bereavement logistics.   ROS Psych: Reports increased anxiety and stress due to recent family death. Denies needing medication change at this time. Endo: Reports improvement in hot flashes with estrogen patch. MSK: Reports knee  pain/instability post-operatively.   The 10-year ASCVD risk score (Arnett DK, et al., 09/14/2017) is: 0.5%  Health Maintenance Due  Topic Date Due   HPV VACCINES (1 - 3-dose SCDM series) Never done   COVID-19 Vaccine (4 - 2024-25 season) Sep 15, 2022   INFLUENZA VACCINE  08/12/2023      Objective:     BP 121/87   Pulse 93   Ht 5' 6 (1.676 m)   Wt 141 lb 12.8 oz (64.3 kg)   SpO2 93%   BMI 22.89 kg/m    Physical Exam Gen: alert, oriented Pulm: no resp distress Psych: tearful when discussing brother's recent death.    No results found for any visits on 09/07/23.      Assessment & Plan:   Shift work sleep disorder Assessment & Plan: - Follow-up for medication management. History of insomnia and anxiety related to night shift work. Recently experienced the death of her brother by suicide, leading to increased daily use of medication for sleep. Has an established relationship with a therapist, with an upcoming appointment. - Refill prescription as previously written by prior provider. - Send prescription to [facility]. - Discussed FMLA paperwork if needed for bereavement. - Follow up in 3 months.  Orders: -     clonazePAM ; Take 1 tablet (0.5 mg total) by mouth daily as needed for sleep/insomnia.  Dispense: 30 tablet; Refill: 1  Injury of left knee, subsequent encounter Assessment & Plan: - Status post left knee surgery in 06/2023 for medial ligament tear and patellar fracture. attending physical therapy. Returned to work this week. - Continue current management with orthopedics.   Vasomotor symptoms due to menopause Assessment & Plan: - Reports  symptoms are improving with estrogen patch and depo-provera  - Continue current treatment.   Bereavement Assessment & Plan: Pt found out her brother committed suicide a few days ago. Appropriately tearful during exam.  Has a therapist whom she already sees but cannot see them again for a few more weeks.        Return  in about 3 months (around 12/08/2023) for medication f/u.    Toribio MARLA Slain, MD

## 2023-09-07 NOTE — Assessment & Plan Note (Signed)
-   Follow-up for medication management. History of insomnia and anxiety related to night shift work. Recently experienced the death of her brother by suicide, leading to increased daily use of medication for sleep. Has an established relationship with a therapist, with an upcoming appointment. - Refill prescription as previously written by prior provider. - Send prescription to [facility]. - Discussed FMLA paperwork if needed for bereavement. - Follow up in 3 months.

## 2023-09-08 ENCOUNTER — Ambulatory Visit (INDEPENDENT_AMBULATORY_CARE_PROVIDER_SITE_OTHER): Admitting: Psychology

## 2023-09-08 ENCOUNTER — Other Ambulatory Visit (HOSPITAL_COMMUNITY): Payer: Self-pay

## 2023-09-08 DIAGNOSIS — M25562 Pain in left knee: Secondary | ICD-10-CM | POA: Diagnosis not present

## 2023-09-08 DIAGNOSIS — F411 Generalized anxiety disorder: Secondary | ICD-10-CM
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
## 2023-09-23 ENCOUNTER — Ambulatory Visit: Admitting: Psychology

## 2023-09-23 DIAGNOSIS — F4322 Adjustment disorder with anxiety: Secondary | ICD-10-CM

## 2023-09-23 NOTE — Progress Notes (Signed)
 [facility] Counselor/Therapist Progress Note  Patient ID: Brianna Shaw, MRN: [id number],    Date: 09/23/2023  Time Spent: 60 mins    start time: 1500     end: 1600  Treatment Type: Individual Therapy  Reported Symptoms: Pt presented for today's session, via Caregility video.  Pt granted consent for the session, stating she is in her home with no one else present; pt understands the limits of virtual sessions.  I shared with pt that I am in my office with no one else here.  Mental Status Exam: Appearance:  Casual     Behavior: Appropriate  Motor: Normal  Speech/Language:  Clear and Coherent  Affect: Appropriate  Mood: normal  Thought process: normal  Thought content:   WNL  Sensory/Perceptual disturbances:   WNL  Orientation: oriented to person, place, and time/date  Attention: Good  Concentration: Good  Memory: WNL  Fund of knowledge:  Good  Insight:   Good  Judgment:  Good  Impulse Control: Good   Risk Assessment: Danger to Self:  No Self-injurious Behavior: No Danger to Others: No Duty to Warn:no Physical Aggression / Violence:No  Access to Firearms a concern: No  Gang Involvement:No   Subjective: Pt shares that I have been back at work since my medical leave and it is hard to get back in the routine.  I have been to [place] since our last session and that was a fun trip.  Pt shares there are still issues with them trying to go to [facility] and get her brother's [place]) stuff.  They were able to get the 3 cats Chuck had.  Pt shares that they have gotten his ashes back from the service; they were able to get her niece Basilio) Chuck's 401k money.  Pt shares that she enjoyed the concert in [place] and was glad to get back home.  Pt shares she has not been able to do PT but about 3-4 times since her surgery because of her work schedule and her travel.  Pt is talking to a new guy (Joe).  They met through a friend and he has been attentive to her.  They have done  stuff with each other and have plans for things even into the Fall.  They are going to [place] in Dec.  She has other concerts upcoming in the next few weeks.  Encouraged pt to continue with her self care activities and we will meet in 4 weeks for a follow up session.  Interventions: Cognitive Behavioral Therapy  Diagnosis:Adjustment disorder with anxious mood  Plan: Treatment Plan Strengths/Abilities:  Intelligent, Intuitive, Willing to participate in therapy Treatment Preferences:  Outpatient Individual Therapy Statement of Needs:  Patient is to use CBT, mindfulness and coping skills to help manage and/or decrease symptoms associated with their diagnosis. Symptoms:  Depressed/Irritable mood, worry, social withdrawal Problems Addressed:  Depressive thoughts, Sadness, Sleep issues, etc. Long Term Goals:  Pt to reduce overall level, frequency, and intensity of the feelings of anxiety as evidenced by decreased irritability, negative self talk, and helpless feelings from 6 to 7 days/week to 0 to 1 days/week, per client report, for at least 3 consecutive months.  Progress: 30% Short Term Goals:  Pt to verbally express understanding of the relationship between feelings of anxiety and their impact on thinking patterns and behaviors.  Pt to verbalize an understanding of the role that distorted thinking plays in creating fears, excessive worry, and ruminations.  Progress: 30% Target Date:  02/26/2024 Frequency:  Bi-weekly  Modality:  Cognitive Behavioral Therapy Interventions by Therapist:  Therapist will use CBT, Mindfulness exercises, Coping skills and Referrals, as needed by client. Client has verbally approved this treatment plan.  Francis KATHEE Macintosh, [facility]

## 2023-10-07 ENCOUNTER — Other Ambulatory Visit: Payer: Self-pay

## 2023-10-07 ENCOUNTER — Other Ambulatory Visit (HOSPITAL_COMMUNITY): Payer: Self-pay

## 2023-10-07 MED ORDER — ESTRADIOL 0.05 MG/24HR TD PTTW
1.0000 | MEDICATED_PATCH | TRANSDERMAL | 0 refills | Status: AC
  Filled 2023-10-07: qty 8, 28d supply, fill #0

## 2023-10-12 ENCOUNTER — Other Ambulatory Visit (HOSPITAL_COMMUNITY): Payer: Self-pay

## 2023-10-13 ENCOUNTER — Ambulatory Visit: Admitting: Psychology

## 2023-10-13 DIAGNOSIS — F4322 Adjustment disorder with anxiety: Secondary | ICD-10-CM
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
## 2023-11-08 ENCOUNTER — Other Ambulatory Visit (HOSPITAL_COMMUNITY): Payer: Self-pay

## 2023-11-08 ENCOUNTER — Ambulatory Visit: Admitting: Psychology

## 2023-11-08 DIAGNOSIS — F4322 Adjustment disorder with anxiety: Secondary | ICD-10-CM | POA: Diagnosis not present
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
## 2023-11-09 ENCOUNTER — Other Ambulatory Visit: Payer: Self-pay

## 2023-11-10 ENCOUNTER — Other Ambulatory Visit (HOSPITAL_COMMUNITY): Payer: Self-pay

## 2023-11-10 DIAGNOSIS — Z1331 Encounter for screening for depression: Secondary | ICD-10-CM | POA: Diagnosis not present

## 2023-11-10 DIAGNOSIS — Z1231 Encounter for screening mammogram for malignant neoplasm of breast: Secondary | ICD-10-CM | POA: Diagnosis not present

## 2023-11-10 DIAGNOSIS — N959 Unspecified menopausal and perimenopausal disorder: Secondary | ICD-10-CM | POA: Diagnosis not present

## 2023-11-10 DIAGNOSIS — Z01411 Encounter for gynecological examination (general) (routine) with abnormal findings: Secondary | ICD-10-CM | POA: Diagnosis not present

## 2023-11-10 DIAGNOSIS — Z3042 Encounter for surveillance of injectable contraceptive: Secondary | ICD-10-CM | POA: Diagnosis not present

## 2023-11-10 LAB — HM MAMMOGRAPHY

## 2023-11-10 MED ORDER — ESTRADIOL 0.05 MG/24HR TD PTTW
1.0000 | MEDICATED_PATCH | TRANSDERMAL | 3 refills | Status: AC
  Filled 2023-11-10 – 2023-11-14 (×2): qty 24, 84d supply, fill #0
  Filled 2024-02-01: qty 24, 84d supply, fill #1

## 2023-11-10 MED ORDER — MEDROXYPROGESTERONE ACETATE 150 MG/ML IM SUSP
150.0000 mg | INTRAMUSCULAR | 3 refills | Status: AC
  Filled 2023-11-10 – 2023-11-14 (×2): qty 1, 84d supply, fill #0
  Filled 2024-02-01: qty 1, 84d supply, fill #1

## 2023-11-11 ENCOUNTER — Other Ambulatory Visit (HOSPITAL_COMMUNITY): Payer: Self-pay

## 2023-11-14 ENCOUNTER — Other Ambulatory Visit: Payer: Self-pay

## 2023-11-14 ENCOUNTER — Other Ambulatory Visit (HOSPITAL_COMMUNITY): Payer: Self-pay

## 2023-11-21 ENCOUNTER — Other Ambulatory Visit: Payer: Self-pay | Admitting: Medical Genetics

## 2023-11-21 DIAGNOSIS — Z006 Encounter for examination for normal comparison and control in clinical research program: Secondary | ICD-10-CM

## 2023-12-01 ENCOUNTER — Telehealth: Payer: Self-pay

## 2023-12-01 ENCOUNTER — Ambulatory Visit: Admitting: Psychology

## 2023-12-01 DIAGNOSIS — F4322 Adjustment disorder with anxiety: Secondary | ICD-10-CM | POA: Diagnosis not present

## 2023-12-01 NOTE — Telephone Encounter (Signed)
 Copied from CRM [id number]. Topic: Appointments - Transfer of Care >> Dec 01, 2023  2:56 PM Willma R wrote: Pt is requesting to transfer FROM: Toribio Slain Pt is requesting to transfer TO: Saddie Sacks Reason for requested transfer: wants to see a female moving forward It is the responsibility of the team the patient would like to transfer to [facility] Saddie Sacks) to reach out to the patient if for any reason this transfer is not acceptable.

## 2023-12-01 NOTE — Progress Notes (Signed)
  [facility] Counselor/Therapist Progress Note  Patient ID: Brianna Shaw, MRN: [id number],    Date: 12/01/2023  Time Spent: 60 mins    start time: 1500     end: 1600  Treatment Type: Individual Therapy  Reported Symptoms: Pt presented for today's session, via Caregility video.  Pt granted consent for the session, stating she is in her home with no one else present; pt understands the limits of virtual sessions.  I shared with pt that I am in my office with no one else here.  Mental Status Exam: Appearance:  Casual     Behavior: Appropriate  Motor: Normal  Speech/Language:  Clear and Coherent  Affect: Appropriate  Mood: normal  Thought process: normal  Thought content:   WNL  Sensory/Perceptual disturbances:   WNL  Orientation: oriented to person, place, and time/date  Attention: Good  Concentration: Good  Memory: WNL  Fund of knowledge:  Good  Insight:   Good  Judgment:  Good  Impulse Control: Good   Risk Assessment: Danger to Self:  No Self-injurious Behavior: No Danger to Others: No Duty to Warn:no Physical Aggression / Violence:No  Access to Firearms a concern: No  Gang Involvement:No   Subjective: Pt shares that I have been pretty good since our last session.  My mom got out of the hospital a couple of weeks ago.  I went to [facility] for my birthday and I went to [place] for a concert festival too.  I had seen Brianna Shaw a couple more times and there was tension between us .  He blew up on me when I was asking if he was going to be back from [place] for my birthday.  He sent me a text the day before my birthday saying that he was not available to be in a relationship and he told her that he still loved his previous girlfriend; it turns out that he is now back with his former girlfriend Brianna Shaw).  Pt shares that she is glad that she knows the truth about Brianna Shaw.  Pt is very thankful for all of her friends who are supporting her.  Encouraged pt to continue with her self care  activities and we will meet in 3 weeks for a follow up session.  Interventions: Cognitive Behavioral Therapy  Diagnosis:Adjustment disorder with anxious mood  Plan: Treatment Plan Strengths/Abilities:  Intelligent, Intuitive, Willing to participate in therapy Treatment Preferences:  Outpatient Individual Therapy Statement of Needs:  Patient is to use CBT, mindfulness and coping skills to help manage and/or decrease symptoms associated with their diagnosis. Symptoms:  Depressed/Irritable mood, worry, social withdrawal Problems Addressed:  Depressive thoughts, Sadness, Sleep issues, etc. Long Term Goals:  Pt to reduce overall level, frequency, and intensity of the feelings of anxiety as evidenced by decreased irritability, negative self talk, and helpless feelings from 6 to 7 days/week to 0 to 1 days/week, per client report, for at least 3 consecutive months.  Progress: 30% Short Term Goals:  Pt to verbally express understanding of the relationship between feelings of anxiety and their impact on thinking patterns and behaviors.  Pt to verbalize an understanding of the role that distorted thinking plays in creating fears, excessive worry, and ruminations.  Progress: 30% Target Date:  02/26/2024 Frequency:  Bi-weekly Modality:  Cognitive Behavioral Therapy Interventions by Therapist:  Therapist will use CBT, Mindfulness exercises, Coping skills and Referrals, as needed by client. Client has verbally approved this treatment plan.  Francis KATHEE Macintosh, [facility]

## 2023-12-13 ENCOUNTER — Ambulatory Visit: Admitting: Family Medicine

## 2023-12-22 ENCOUNTER — Telehealth: Payer: Self-pay | Admitting: Internal Medicine

## 2023-12-22 ENCOUNTER — Ambulatory Visit: Admitting: Psychology

## 2023-12-22 DIAGNOSIS — F4322 Adjustment disorder with anxiety: Secondary | ICD-10-CM | POA: Diagnosis not present

## 2023-12-22 NOTE — Progress Notes (Signed)
 [facility] Counselor/Therapist Progress Note  Patient ID: Brianna Shaw, MRN: [id number],    Date: 12/22/2023  Time Spent: 60 mins    start time: 1500     end: 1600  Treatment Type: Individual Therapy  Reported Symptoms: Pt presented for today's session, via Caregility video.  Pt granted consent for the session, stating she is in her home with no one else present; pt understands the limits of virtual sessions.  I shared with pt that I am in my office with no one else here.  Mental Status Exam: Appearance:  Casual     Behavior: Appropriate  Motor: Normal  Speech/Language:  Clear and Coherent  Affect: Appropriate  Mood: normal  Thought process: normal  Thought content:   WNL  Sensory/Perceptual disturbances:   WNL  Orientation: oriented to person, place, and time/date  Attention: Good  Concentration: Good  Memory: WNL  Fund of knowledge:  Good  Insight:   Good  Judgment:  Good  Impulse Control: Good   Risk Assessment: Danger to Self:  No Self-injurious Behavior: No Danger to Others: No Duty to Warn:no Physical Aggression / Violence:No  Access to Firearms a concern: No  Gang Involvement:No   Notified of retirement  Subjective: Pt shares that I have been pretty good since our last session.  I learned that we are losing our jobs as of April 2026.  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  Pt shares that she is going to have to do the same work but for $30.00 per hour less; she is not happy about that.  Pt shares that she is going to have to start re-paying her student loans.  Pt is very stressed out because she has scheduled concerts to attend for the rest of 2026 because she thought she knew what her schedule was going to be before the loss of her current position.  Pt has gotten an appt with Dr. Sharron at our practice for ADHD testing in January and she feels good to have that appt in place.  Pt shares that her mom has not been back in the  hospital since our last session.  Pt shares that she got together with family (niece, brother, etc.) last week to celebrate her brother Chuck's life.  Pt shares that she has been trying to live her life and is not thinking about dating at this point.  Joe has frozen her Venmo account because of the plane ticket to [place] that they did not take and there is a -$600.00 balance on her Venmo account.  Pt shares that Larnell is still following her on Instagram and is reading her posts and stories.  Talked with pt about my retirement plans for March and her options for therapy moving forward.  Pt is very thankful for all of her friends who are supporting her.  Encouraged pt to continue with her self care activities and we will meet in 3 weeks for a follow up session.  Interventions: Cognitive Behavioral Therapy  Diagnosis:Adjustment disorder with anxious mood  Plan: Treatment Plan Strengths/Abilities:  Intelligent, Intuitive, Willing to participate in therapy Treatment Preferences:  Outpatient Individual Therapy Statement of Needs:  Patient is to use CBT, mindfulness and coping skills to help manage and/or decrease symptoms associated with their diagnosis. Symptoms:  Depressed/Irritable mood, worry, social withdrawal Problems Addressed:  Depressive thoughts, Sadness, Sleep issues, etc. Long Term Goals:  Pt to reduce overall level, frequency, and intensity of the feelings of anxiety  as evidenced by decreased irritability, negative self talk, and helpless feelings from 6 to 7 days/week to 0 to 1 days/week, per client report, for at least 3 consecutive months.  Progress: 30% Short Term Goals:  Pt to verbally express understanding of the relationship between feelings of anxiety and their impact on thinking patterns and behaviors.  Pt to verbalize an understanding of the role that distorted thinking plays in creating fears, excessive worry, and ruminations.  Progress: 30% Target Date:  02/25/2025 Frequency:   Bi-weekly Modality:  Cognitive Behavioral Therapy Interventions by Therapist:  Therapist will use CBT, Mindfulness exercises, Coping skills and Referrals, as needed by client. Client has verbally approved this treatment plan.  Francis KATHEE Macintosh, [facility]

## 2023-12-22 NOTE — Telephone Encounter (Signed)
 PT short tem disability was denied because she needs a letter from Dr. Avram for an appeal proving she is not sick with UC. Requesting that a detailed voicemail be left if she does not answer.

## 2023-12-22 NOTE — Telephone Encounter (Signed)
 Patient is completing paperwork for [employer] short term disability. She checked a box yes that should have been no regarding the ulcerative colitis. The company will not let her correct her answer. They are requiring a statement from the doctor proving she has not been hospitalized or treated in the past 5 years for any condition related to UC.

## 2023-12-23 NOTE — Telephone Encounter (Signed)
 Patient advised we can help with this but need specifics on it. A letter? Whom should it be to?

## 2024-01-09 ENCOUNTER — Ambulatory Visit: Admitting: Family Medicine

## 2024-01-17 ENCOUNTER — Ambulatory Visit: Admitting: Psychology

## 2024-01-17 DIAGNOSIS — F4322 Adjustment disorder with anxiety: Secondary | ICD-10-CM | POA: Diagnosis not present

## 2024-01-17 NOTE — Progress Notes (Signed)
 " [facility] Counselor/Therapist Progress Note  Patient ID: Brianna Shaw, MRN: [id number],    Date: 01/17/2024  Time Spent: 60 mins    start time: 1400     end: 1500  Treatment Type: Individual Therapy  Reported Symptoms: Pt presented for today's session, via Caregility video.  Pt granted consent for the session, stating she is in her home with no one else present; pt understands the limits of virtual sessions.  I shared with pt that I am in my office with no one else here.  Mental Status Exam: Appearance:  Casual     Behavior: Appropriate  Motor: Normal  Speech/Language:  Clear and Coherent  Affect: Appropriate  Mood: normal  Thought process: normal  Thought content:   WNL  Sensory/Perceptual disturbances:   WNL  Orientation: oriented to person, place, and time/date  Attention: Good  Concentration: Good  Memory: WNL  Fund of knowledge:  Good  Insight:   Good  Judgment:  Good  Impulse Control: Good   Risk Assessment: Danger to Self:  No Self-injurious Behavior: No Danger to Others: No Duty to Warn:no Physical Aggression / Violence:No  Access to Firearms a concern: No  Gang Involvement:No   Notified of retirement  Subjective: Pt shares that I have been pretty good since our last session.  I have my appt for ADHD testing coming up this month (Dr. Sharron) and I am looking forward to that.  The are still sun-setting our positions and we are having to re-apply for new positions but there are no positions to apply for yet.  The communication has been terrible which is not a surprise.  Pt shares that she had some family time with her mom and brother and other family members over the holidays.  Pt shares that her mom is doing OK; pt is planning to sit down with her mom and go through old photos to hear family stories.  She has also been going to several [employer].  Her mom's birthday is tomorrow.  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  Pt continues to  get her nails done as a self care activity.  She also had another massage last week and that was good.  Pt shares that she has not had any other contact with Joe since our last session.  He is still following her on Instagram and is reading her posts and stories. She believes she will have to start paying back her student loans soon but she does not know what that means for her.  Pt is very thankful for all of her friends who are supporting her.  Encouraged pt to continue with her self care activities and we will meet in 3 weeks for a follow up session.  Interventions: Cognitive Behavioral Therapy  Diagnosis:Adjustment disorder with anxious mood  Plan: Treatment Plan Strengths/Abilities:  Intelligent, Intuitive, Willing to participate in therapy Treatment Preferences:  Outpatient Individual Therapy Statement of Needs:  Patient is to use CBT, mindfulness and coping skills to help manage and/or decrease symptoms associated with their diagnosis. Symptoms:  Depressed/Irritable mood, worry, social withdrawal Problems Addressed:  Depressive thoughts, Sadness, Sleep issues, etc. Long Term Goals:  Pt to reduce overall level, frequency, and intensity of the feelings of anxiety as evidenced by decreased irritability, negative self talk, and helpless feelings from 6 to 7 days/week to 0 to 1 days/week, per client report, for at least 3 consecutive months.  Progress: 30% Short Term Goals:  Pt to verbally express  understanding of the relationship between feelings of anxiety and their impact on thinking patterns and behaviors.  Pt to verbalize an understanding of the role that distorted thinking plays in creating fears, excessive worry, and ruminations.  Progress: 30% Target Date:  02/25/2025 Frequency:  Bi-weekly Modality:  Cognitive Behavioral Therapy Interventions by Therapist:  Therapist will use CBT, Mindfulness exercises, Coping skills and Referrals, as needed by client. Client has verbally approved this  treatment plan.  Francis KATHEE Macintosh, [facility] "

## 2024-01-19 ENCOUNTER — Ambulatory Visit

## 2024-01-19 ENCOUNTER — Other Ambulatory Visit (HOSPITAL_COMMUNITY): Payer: Self-pay

## 2024-01-19 ENCOUNTER — Other Ambulatory Visit: Payer: Self-pay

## 2024-01-19 VITALS — BP 123/79 | HR 85 | Temp 98.3°F | Ht 66.0 in | Wt 142.1 lb

## 2024-01-19 DIAGNOSIS — Z1321 Encounter for screening for nutritional disorder: Secondary | ICD-10-CM | POA: Diagnosis not present

## 2024-01-19 DIAGNOSIS — Z13228 Encounter for screening for other metabolic disorders: Secondary | ICD-10-CM | POA: Diagnosis not present

## 2024-01-19 DIAGNOSIS — N951 Menopausal and female climacteric states: Secondary | ICD-10-CM

## 2024-01-19 DIAGNOSIS — Z1329 Encounter for screening for other suspected endocrine disorder: Secondary | ICD-10-CM

## 2024-01-19 DIAGNOSIS — Z130 Encounter for screening for diseases of the blood and blood-forming organs and certain disorders involving the immune mechanism: Secondary | ICD-10-CM | POA: Diagnosis not present

## 2024-01-19 DIAGNOSIS — G4726 Circadian rhythm sleep disorder, shift work type: Secondary | ICD-10-CM | POA: Diagnosis not present

## 2024-01-19 DIAGNOSIS — S8992XD Unspecified injury of left lower leg, subsequent encounter: Secondary | ICD-10-CM | POA: Diagnosis not present

## 2024-01-19 DIAGNOSIS — F411 Generalized anxiety disorder: Secondary | ICD-10-CM | POA: Diagnosis not present

## 2024-01-19 DIAGNOSIS — R7989 Other specified abnormal findings of blood chemistry: Secondary | ICD-10-CM | POA: Diagnosis not present

## 2024-01-19 MED ORDER — CLONAZEPAM 0.5 MG PO TABS
0.5000 mg | ORAL_TABLET | Freq: Every day | ORAL | 1 refills | Status: AC | PRN
  Filled 2024-01-19: qty 30, 30d supply, fill #0

## 2024-01-19 NOTE — Patient Instructions (Signed)
 VISIT SUMMARY: Today, we discussed your ongoing perimenopausal symptoms, anxiety and sleep disturbances, attention deficit symptoms, knee injury, and general health maintenance. We reviewed your current treatments and made plans for further evaluations and follow-ups.  YOUR PLAN: PERIMENOPAUSAL SYMPTOMS: Your symptoms have improved with the use of estrogen patches, but you still experience hot flashes twice a week. -Continue using estrogen patches as prescribed. -We have ordered FSH and LH levels to monitor your hormone therapy. -We will await the results of your ADHD testing before considering any additional treatments.  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 -Continue taking Klonopin  as prescribed to aid sleep.  GENERALIZED ANXIETY DISORDER: Your anxiety is exacerbated by life stressors, and you are currently undergoing therapy. -Continue with your ongoing therapy. -We will await the results of your ADHD testing to assess potential treatment options.  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 -Continue using your knee brace for support as advised by your orthopedist.  ULCERATIVE COLITIS: Your condition is currently managed with generic mesalamine  and you have no reported issues. -Continue taking generic mesalamine  as prescribed.  GENERAL HEALTH MAINTENANCE: You are up to date on your flu vaccination and due for a COVID vaccine. Your recent colonoscopy was normal. -Schedule your COVID vaccine as per work requirements. -Continue with routine health maintenance.  If you have any problems before your next visit feel free to message me via MyChart (minor issues or questions) or call the office, otherwise you may reach out to schedule an office visit.  Thank you! Saddie Sacks, PA-C

## 2024-01-20 ENCOUNTER — Other Ambulatory Visit (HOSPITAL_COMMUNITY): Payer: Self-pay

## 2024-01-22 ENCOUNTER — Other Ambulatory Visit (HOSPITAL_COMMUNITY): Payer: Self-pay

## 2024-01-22 ENCOUNTER — Encounter (HOSPITAL_COMMUNITY): Payer: Self-pay

## 2024-01-23 ENCOUNTER — Other Ambulatory Visit (HOSPITAL_COMMUNITY): Payer: Self-pay

## 2024-01-23 NOTE — Assessment & Plan Note (Signed)
-   Status post left knee surgery in 06/2023 for medial ligament tear and patellar fracture. attending physical therapy  - Continue using knee brace for support. - Continue follow up with ortho as scheduled

## 2024-01-23 NOTE — Assessment & Plan Note (Signed)
 Klonopin  effective for sleep; anxiety and stressors may contribute to difficulties. - Refilled Klonopin  prescription. - Continue with therapy weekly  - PDMP reviewed, no aberrancies.

## 2024-01-23 NOTE — Assessment & Plan Note (Deleted)
 Ongoing knee instability; uses knee brace. Orthopedist advised three-month disability leave. - Continue using knee brace for support. - Continue follow up with ortho as scheduled

## 2024-01-23 NOTE — Assessment & Plan Note (Signed)
 Anxiety exacerbated by life stressors; therapy ongoing. Awaiting ADHD testing results for potential treatment options. - Continue therapy. - Await ADHD testing results to assess potential treatment options.

## 2024-01-23 NOTE — Progress Notes (Signed)
 "  Established Patient Office Visit  Subjective   Patient ID: Brianna Shaw, female    DOB: 12-01-1980  Age: 44 y.o. MRN: [id number]  No chief complaint on file.   HPI  Discussed the use of AI scribe software for clinical note transcription with the patient, who gave verbal consent to proceed.  History of Present Illness   Brianna Shaw is a 44 year old female who presents for a transfer of care and management of perimenopausal symptoms.  Perimenopausal symptoms - Hot flashes improved with estrogen patches applied twice weekly - Mood swings present - Sex drive improved with hormone therapy - Estrogen patches used for less than one year, initially prescribed by gynecologist after previous discontinuation - Progesterone included in hormone therapy regimen - Depo-Provera  injections administered four times per year - Following with her OB for management   Anxiety and sleep disturbance - Persistent anxiety attributed to life stressors, including bereavement and caregiving responsibilities - Difficulty sleeping despite use of melatonin, magnesium , and blackout curtains - Klonopin  used to aid sleep and manage anxiety, with reported benefit  Attention deficit symptoms - Undergoing evaluation for ADHD with virtual appointment scheduled with psychiatrist  - Three and a half years of ongoing therapy - Considering medication for daytime symptoms, with hope for improvement in sleep  Knee injury and functional limitation - History of torn ligament and fractured kneecap requiring surgical intervention - Currently wearing knee brace for stability during work - Decreased physical activity attributed to work schedule and knee limitations  Endocrine concerns - History of low cortisol level - Considering repeat evaluation of cortisol          ROS Per HPI.    Objective:     BP 123/79   Pulse 85   Temp 98.3 F (36.8 C) (Oral)   Ht 5' 6 (1.676 m)   Wt 142 lb 1.3 oz  (64.4 kg)   SpO2 98%   BMI 22.93 kg/m    Physical Exam Constitutional:      General: She is not in acute distress.    Appearance: Normal appearance.  Cardiovascular:     Rate and Rhythm: Normal rate and regular rhythm.     Heart sounds: Normal heart sounds. No murmur heard.    No friction rub. No gallop.  Pulmonary:     Effort: Pulmonary effort is normal. No respiratory distress.     Breath sounds: Normal breath sounds.  Musculoskeletal:        General: No swelling.     Cervical back: Neck supple.  Lymphadenopathy:     Cervical: No cervical adenopathy.  Skin:    General: Skin is warm and dry.  Neurological:     General: No focal deficit present.     Mental Status: She is alert.  Psychiatric:        Mood and Affect: Mood normal.        Behavior: Behavior normal.        Thought Content: Thought content normal.      No results found for any visits on 01/19/24.  Last CBC Lab Results  Component Value Date   WBC 12.7 (H) 07/13/2023   HGB 14.4 07/13/2023   HCT 44.1 07/13/2023   MCV 98.9 07/13/2023   MCH 32.3 07/13/2023   RDW 13.2 07/13/2023   PLT 190 07/13/2023   Last metabolic panel Lab Results  Component Value Date   GLUCOSE 79 07/14/2022   NA 142 07/14/2022   K 4.6 07/14/2022  CL 105 07/14/2022   CO2 21 07/14/2022   BUN 14 07/14/2022   CREATININE 0.87 07/14/2022   EGFR 85 07/14/2022   CALCIUM 9.8 07/14/2022   PROT 7.3 07/14/2022   ALBUMIN 4.7 07/14/2022   LABGLOB 2.6 07/14/2022   AGRATIO 1.7 07/15/2021   BILITOT 0.6 07/14/2022   ALKPHOS 65 07/14/2022   AST 20 07/14/2022   ALT 23 07/14/2022   ANIONGAP 6 11/27/2014   Last lipids Lab Results  Component Value Date   CHOL 212 (H) 07/14/2022   HDL 65 07/14/2022   LDLCALC 133 (H) 07/14/2022   TRIG 80 07/14/2022   CHOLHDL 3.3 07/14/2022   Last hemoglobin A1c Lab Results  Component Value Date   HGBA1C 5.1 07/14/2022   Last thyroid  functions Lab Results  Component Value Date   TSH 3.510  07/14/2022   FREET4 1.27 07/15/2021   Last vitamin D  Lab Results  Component Value Date   VD25OH 45.7 07/15/2021      The 10-year ASCVD risk score (Arnett DK, et al., 2019) is: 0.6%    Assessment & Plan:   Vasomotor symptoms due to menopause Assessment & Plan: Symptoms improved with estrogen patches; hot flashes occur twice weekly. Awaiting ADHD testing results before additional treatments. - Continue estrogen patches. - Ordered FSH and LH levels to monitor hormone therapy. - Await ADHD testing results before considering additional treatments.  Orders: -     FSH/LH; Future  Shift work sleep disorder Assessment & Plan: Klonopin  effective for sleep; anxiety and stressors may contribute to difficulties. - Refilled Klonopin  prescription. - Continue with therapy weekly  - PDMP reviewed, no aberrancies.  Orders: -     clonazePAM ; Take 1 tablet (0.5 mg total) by mouth daily as needed for sleep/insomnia.  Dispense: 30 tablet; Refill: 1  Screening for endocrine, nutritional, metabolic and immunity disorder -     VITAMIN D  25 Hydroxy (Vit-D Deficiency, Fractures); Future -     TSH; Future -     Hemoglobin A1c; Future -     Lipid panel; Future -     Comprehensive metabolic panel with GFR; Future -     CBC with Differential/Platelet; Future  Low serum cortisol level -     Cortisol; Future  Generalized anxiety disorder Assessment & Plan: Anxiety exacerbated by life stressors; therapy ongoing. Awaiting ADHD testing results for potential treatment options. - Continue therapy. - Await ADHD testing results to assess potential treatment options.   Injury of left knee, subsequent encounter Assessment & Plan: - Status post left knee surgery in 06/2023 for medial ligament tear and patellar fracture. attending physical therapy  - Continue using knee brace for support. - Continue follow up with ortho as scheduled        Return in about 4 months (around 05/18/2024) for Physical.     Saddie JULIANNA Sacks, PA-C "

## 2024-01-23 NOTE — Assessment & Plan Note (Signed)
 Symptoms improved with estrogen patches; hot flashes occur twice weekly. Awaiting ADHD testing results before additional treatments. - Continue estrogen patches. - Ordered FSH and LH levels to monitor hormone therapy. - Await ADHD testing results before considering additional treatments.

## 2024-01-27 ENCOUNTER — Other Ambulatory Visit

## 2024-01-27 DIAGNOSIS — Z1321 Encounter for screening for nutritional disorder: Secondary | ICD-10-CM

## 2024-01-27 DIAGNOSIS — R7989 Other specified abnormal findings of blood chemistry: Secondary | ICD-10-CM

## 2024-01-27 DIAGNOSIS — N951 Menopausal and female climacteric states: Secondary | ICD-10-CM

## 2024-01-28 LAB — CBC WITH DIFFERENTIAL/PLATELET
Basophils Absolute: 0 x10E3/uL (ref 0.0–0.2)
Basos: 1 %
EOS (ABSOLUTE): 0.2 x10E3/uL (ref 0.0–0.4)
Eos: 2 %
Hematocrit: 46.5 % (ref 34.0–46.6)
Hemoglobin: 14.8 g/dL (ref 11.1–15.9)
Immature Grans (Abs): 0 x10E3/uL (ref 0.0–0.1)
Immature Granulocytes: 0 %
Lymphocytes Absolute: 3 x10E3/uL (ref 0.7–3.1)
Lymphs: 37 %
MCH: 31.4 pg (ref 26.6–33.0)
MCHC: 31.8 g/dL (ref 31.5–35.7)
MCV: 99 fL — ABNORMAL HIGH (ref 79–97)
Monocytes Absolute: 0.6 x10E3/uL (ref 0.1–0.9)
Monocytes: 7 %
Neutrophils Absolute: 4.2 x10E3/uL (ref 1.4–7.0)
Neutrophils: 53 %
Platelets: 228 x10E3/uL (ref 150–450)
RBC: 4.71 x10E6/uL (ref 3.77–5.28)
RDW: 11.9 % (ref 11.7–15.4)
WBC: 7.9 x10E3/uL (ref 3.4–10.8)

## 2024-01-28 LAB — FSH/LH
FSH: <0.3 m[IU]/mL
LH: <0.3 m[IU]/mL

## 2024-01-28 LAB — COMPREHENSIVE METABOLIC PANEL WITH GFR
ALT: 14 IU/L (ref 0–32)
AST: 17 IU/L (ref 0–40)
Albumin: 4.6 g/dL (ref 3.9–4.9)
Alkaline Phosphatase: 46 IU/L (ref 41–116)
BUN/Creatinine Ratio: 16 (ref 9–23)
BUN: 14 mg/dL (ref 6–24)
Bilirubin Total: 1.3 mg/dL — ABNORMAL HIGH (ref 0.0–1.2)
CO2: 19 mmol/L — ABNORMAL LOW (ref 20–29)
Calcium: 9.4 mg/dL (ref 8.7–10.2)
Chloride: 105 mmol/L (ref 96–106)
Creatinine, Ser: 0.87 mg/dL (ref 0.57–1.00)
Globulin, Total: 2.6 g/dL (ref 1.5–4.5)
Glucose: 82 mg/dL (ref 70–99)
Potassium: 4.4 mmol/L (ref 3.5–5.2)
Sodium: 141 mmol/L (ref 134–144)
Total Protein: 7.2 g/dL (ref 6.0–8.5)
eGFR: 84 mL/min/1.73

## 2024-01-28 LAB — LIPID PANEL
Chol/HDL Ratio: 3.3 ratio (ref 0.0–4.4)
Cholesterol, Total: 193 mg/dL (ref 100–199)
HDL: 58 mg/dL
LDL Chol Calc (NIH): 119 mg/dL — ABNORMAL HIGH (ref 0–99)
Triglycerides: 90 mg/dL (ref 0–149)
VLDL Cholesterol Cal: 16 mg/dL (ref 5–40)

## 2024-01-28 LAB — TSH: TSH: 2.42 u[IU]/mL (ref 0.450–4.500)

## 2024-01-28 LAB — HEMOGLOBIN A1C
Est. average glucose Bld gHb Est-mCnc: 97 mg/dL
Hgb A1c MFr Bld: 5 % (ref 4.8–5.6)

## 2024-01-28 LAB — CORTISOL: Cortisol: 3.1 ug/dL — ABNORMAL LOW (ref 6.2–19.4)

## 2024-01-28 LAB — VITAMIN D 25 HYDROXY (VIT D DEFICIENCY, FRACTURES): Vit D, 25-Hydroxy: 51.5 ng/mL (ref 30.0–100.0)

## 2024-02-01 ENCOUNTER — Ambulatory Visit: Admitting: Psychology

## 2024-02-01 ENCOUNTER — Other Ambulatory Visit: Payer: Self-pay

## 2024-02-01 ENCOUNTER — Other Ambulatory Visit: Payer: Self-pay | Admitting: Internal Medicine

## 2024-02-01 DIAGNOSIS — F89 Unspecified disorder of psychological development: Secondary | ICD-10-CM

## 2024-02-01 DIAGNOSIS — K5100 Ulcerative (chronic) pancolitis without complications: Secondary | ICD-10-CM

## 2024-02-01 NOTE — Progress Notes (Signed)
 Date: 02/01/2024 Appointment Start Time: 5pm Duration: 116 minutes Provider: Frederic Fire, PsyD Type of Session: Initial Appointment for Evaluation  Location of Patient: Home Location of Provider: Provider's Home (private office) Type of Contact: Caregility video visit with audio  Session Content:  Prior to proceeding with today's appointment, two pieces of identifying information were obtained from Janeann to verify identity. In addition, Demoni's physical location at the time of this appointment was obtained. In the event of technical difficulties, Konya shared a phone number she could be reached at. Graysen and this provider participated in today's telepsychological service. Peniel denied anyone else being present in the room or on the virtual appointment.  The provider's role was explained to Marigene. The provider reviewed and discussed issues of confidentiality, privacy, and limits therein (e.g., reporting obligations). In addition to verbal informed consent, written informed consent for psychological services was obtained from Faiga prior to the initial appointment. Written consent included information concerning the practice, financial arrangements, and confidentiality and patients' rights. Since the clinic is not a 24/7 crisis center, mental health emergency resources were shared, and the provider explained e-mail, voicemail, and/or other messaging systems should be utilized only for non-emergency reasons. This provider also explained that information obtained during appointments will be placed in their electronic medical record in a confidential manner. Zanai verbally acknowledged understanding of the aforementioned and agreed to use mental health emergency resources discussed if needed. Moreover, Amenah agreed information may be shared with other [facility] or their referring provider(s) as needed for coordination of care. By signing the new patient documents, Ellenore provided written  consent for coordination of care. Jeneva verbally acknowledged understanding she is ultimately responsible for understanding her insurance benefits as it relates to reimbursement of telepsychological and in-person services. This provider also reviewed confidentiality, as it relates to telepsychological services, as well as the rationale for telepsychological services. This provider further explained that video should not be captured, photos should not be taken, nor should testing stimuli be copied or recorded as it would be a copyright violation. Jasie expressed understanding of the aforementioned, and verbally consented to proceed. Sandrika is aware of the limitations of teleheath visits and verbally consented to proceed.  Zenora completed the neurobehavioral examination, which included obtaining a, family, social, and psychiatric history; integration of prior history and other sources of clinical data to assist with clinical decision making; behavioral observations; assessment of thinking, reasoning, and judgment; and establishment of a provisional diagnosis. The evaluation was completed in 116 minutes. Codes [id number] and [id number] were billed.   Mental Status Examination:  Appearance:  neat Behavior: appropriate to circumstances Mood: euthymic Affect: mood congruent Speech: tangential  Eye Contact: appropriate Psychomotor Activity: restless Thought Process: denies suicidal, homicidal, and self-harm ideation, plan and intent Content/Perceptual Disturbances: none Orientation: AAOx4 Cognition/Sensorium: intact Insight: fair Judgment: good  Provisional DSM-5 diagnosis(es):  F89 Unspecified Disorder of Psychological Development   Plan: Testing is expected to answer the question, does the individual meet criteria for ADHD when age, other mental health concerns (e.g., anxiety, sleep impairment, and depression), and cognitive functioning are taken into consideration? Further testing is warranted because  a diagnosis cannot be given solely based on current interview data (further data is required). Testing results are expected to answer the remaining diagnostic questions in order to provide an accurate diagnosis and assist in treatment planning with an expectation of improved clinical outcome. Sylina is currently scheduled for an appointment on 02/16/2024 at 5pm via Caregility video visit with audio.  Frederic ONEIDA Fire, PsyD

## 2024-02-06 ENCOUNTER — Ambulatory Visit: Payer: Self-pay

## 2024-02-16 ENCOUNTER — Ambulatory Visit: Admitting: Psychology

## 2024-02-16 DIAGNOSIS — F89 Unspecified disorder of psychological development: Secondary | ICD-10-CM

## 2024-02-16 NOTE — Progress Notes (Signed)
 Date: 02/16/2024   Appointment Start Time: 5:10-5:55pm (psychodiagnostic intv) and 5:55-6:26pm (WAIS-5 administration) Duration: 76 total minutes minutes Provider: Frederic Fire, PsyD Type of Session: Testing Appointment for Evaluation  Location of Patient: Home Location of Provider: Provider's Home (private office) Type of Contact: Caregility video visit with audio  Session Content: Today's appointment was a telepsychological visit. Brianna Shaw is aware it is her responsibility to secure confidentiality on her end of the session. Prior to proceeding with today's appointment, Brianna Shaw's physical location at the time of this appointment was obtained as well a phone number she could be reached at in the event of technical difficulties. Brianna Shaw denied anyone else being present in the room or on the virtual appointment. This provider reviewed that video should not be captured, photos should not be taken, nor should testing stimuli be copied or recorded as it would be a copyright violation. Brianna Shaw expressed understanding of the aforementioned, and verbally consented to proceed. The WAIS-5 was administered, scored, and interpreted by this evaluator. Brianna Shaw is aware of the limitations of teleheath visits and verbally consented to proceed.  Brianna Shaw completed the psychiatric diagnostic evaluation (history, including past, family, social, and  psychiatric history; behavioral observations; and establishment of a provisional diagnosis). The evaluation was completed in 45 minutes. Code 09208 was billed. See the evaluation below for additional information.   Billing codes for report writing and test administration, scoring, and interpreting will be input on the feedback appointment.   Provisional DSM-5 diagnosis(es):  F89 Unspecified Disorder of Psychological Development   Plan: Brianna Shaw was scheduled for a feedback appointment on 03/01/2024 at 3pm via Caregility video visit with audio.                         Frederic ONEIDA Fire, PsyD

## 2024-02-22 ENCOUNTER — Ambulatory Visit: Admitting: Psychology

## 2024-03-01 ENCOUNTER — Ambulatory Visit: Admitting: Psychology

## 2024-04-23 ENCOUNTER — Ambulatory Visit: Admitting: Psychology

## 2024-05-22 ENCOUNTER — Encounter
